# Patient Record
Sex: Male | Born: 1945
Health system: Southern US, Community
[De-identification: ages and names within clinical notes are randomized; demographics above are authoritative.]

## PROBLEM LIST (undated history)

## (undated) DIAGNOSIS — M7122 Synovial cyst of popliteal space [Baker], left knee: Secondary | ICD-10-CM

## (undated) DIAGNOSIS — H833X9 Noise effects on inner ear, unspecified ear: Secondary | ICD-10-CM

## (undated) DIAGNOSIS — Z77098 Contact with and (suspected) exposure to other hazardous, chiefly nonmedicinal, chemicals: Secondary | ICD-10-CM

## (undated) DIAGNOSIS — C801 Malignant (primary) neoplasm, unspecified: Secondary | ICD-10-CM

## (undated) DIAGNOSIS — A419 Sepsis, unspecified organism: Secondary | ICD-10-CM

## (undated) DIAGNOSIS — N200 Calculus of kidney: Secondary | ICD-10-CM

## (undated) HISTORY — DX: Calculus of kidney: N20.0

## (undated) HISTORY — DX: Malignant (primary) neoplasm, unspecified: C80.1

## (undated) HISTORY — DX: Sepsis, unspecified organism: A41.9

## (undated) HISTORY — DX: Noise effects on inner ear, unspecified ear: H83.3X9

## (undated) HISTORY — DX: Contact with and (suspected) exposure to other hazardous, chiefly nonmedicinal, chemicals: Z77.098

## (undated) HISTORY — DX: Synovial cyst of popliteal space (Baker), left knee: M71.22

---

## 1990-05-19 HISTORY — PX: EYE SURGERY: SHX253

## 1994-05-19 HISTORY — PX: APPENDECTOMY: SHX54

## 1994-05-19 HISTORY — PX: BACK SURGERY: SHX140

## 2007-06-16 ENCOUNTER — Ambulatory Visit: Payer: Self-pay | Admitting: Unknown Physician Specialty

## 2007-07-07 ENCOUNTER — Ambulatory Visit: Payer: Self-pay | Admitting: Unknown Physician Specialty

## 2007-07-12 ENCOUNTER — Ambulatory Visit: Payer: Self-pay | Admitting: Unknown Physician Specialty

## 2009-07-25 ENCOUNTER — Ambulatory Visit: Payer: Self-pay | Admitting: Ophthalmology

## 2011-09-19 ENCOUNTER — Emergency Department: Payer: Self-pay | Admitting: Emergency Medicine

## 2012-01-16 DIAGNOSIS — N200 Calculus of kidney: Secondary | ICD-10-CM | POA: Insufficient documentation

## 2012-01-16 DIAGNOSIS — G8929 Other chronic pain: Secondary | ICD-10-CM | POA: Insufficient documentation

## 2012-01-16 DIAGNOSIS — R2990 Unspecified symptoms and signs involving the nervous system: Secondary | ICD-10-CM | POA: Insufficient documentation

## 2012-01-16 DIAGNOSIS — M549 Dorsalgia, unspecified: Secondary | ICD-10-CM | POA: Insufficient documentation

## 2012-01-22 DIAGNOSIS — D649 Anemia, unspecified: Secondary | ICD-10-CM | POA: Insufficient documentation

## 2012-08-04 DIAGNOSIS — Z79891 Long term (current) use of opiate analgesic: Secondary | ICD-10-CM | POA: Insufficient documentation

## 2013-02-03 DIAGNOSIS — G4700 Insomnia, unspecified: Secondary | ICD-10-CM | POA: Insufficient documentation

## 2013-02-03 DIAGNOSIS — M25512 Pain in left shoulder: Secondary | ICD-10-CM | POA: Insufficient documentation

## 2015-05-05 DIAGNOSIS — Z6823 Body mass index (BMI) 23.0-23.9, adult: Secondary | ICD-10-CM | POA: Diagnosis not present

## 2015-05-05 DIAGNOSIS — R030 Elevated blood-pressure reading, without diagnosis of hypertension: Secondary | ICD-10-CM | POA: Diagnosis not present

## 2015-05-05 DIAGNOSIS — I8393 Asymptomatic varicose veins of bilateral lower extremities: Secondary | ICD-10-CM | POA: Diagnosis not present

## 2015-05-05 DIAGNOSIS — Z0000 Encounter for general adult medical examination without abnormal findings: Secondary | ICD-10-CM | POA: Diagnosis not present

## 2015-05-05 DIAGNOSIS — G622 Polyneuropathy due to other toxic agents: Secondary | ICD-10-CM | POA: Diagnosis not present

## 2015-09-14 DIAGNOSIS — W57XXXA Bitten or stung by nonvenomous insect and other nonvenomous arthropods, initial encounter: Secondary | ICD-10-CM | POA: Diagnosis not present

## 2015-09-14 DIAGNOSIS — S80862A Insect bite (nonvenomous), left lower leg, initial encounter: Secondary | ICD-10-CM | POA: Diagnosis not present

## 2016-06-13 ENCOUNTER — Telehealth: Payer: Self-pay | Admitting: Student

## 2016-06-13 NOTE — Telephone Encounter (Signed)
Pt called back.  He had a flu shot in Nov from the [place].  He isnt a patient here.

## 2016-08-22 LAB — BASIC METABOLIC PANEL
BUN: 9 mg/dL (ref 4–21)
CREATININE: 1.1 mg/dL (ref 0.6–1.3)
GLUCOSE: 75 mg/dL
POTASSIUM: 3.3 mmol/L — AB (ref 3.4–5.3)
Sodium: 138 mmol/L (ref 137–147)

## 2016-08-22 LAB — TSH: TSH: 1.72 u[IU]/mL (ref 0.41–5.90)

## 2016-08-22 LAB — HEMOGLOBIN A1C: HEMOGLOBIN A1C: 4.5

## 2016-08-22 LAB — CBC AND DIFFERENTIAL
HCT: 35 % — AB (ref 41–53)
HEMOGLOBIN: 13 g/dL — AB (ref 13.5–17.5)
Platelets: 182 10*3/uL (ref 150–399)
WBC: 5.4 10*3/mL

## 2016-08-22 LAB — HEPATIC FUNCTION PANEL
ALT: 21 U/L (ref 10–40)
AST: 15 U/L (ref 14–40)
Alkaline Phosphatase: 95 U/L (ref 25–125)
BILIRUBIN DIRECT: 0.2 mg/dL (ref 0.01–0.4)
BILIRUBIN, TOTAL: 0.7 mg/dL

## 2016-08-22 LAB — LIPID PANEL
Cholesterol: 155 mg/dL (ref 0–200)
HDL: 48 mg/dL (ref 35–70)
LDL Cholesterol: 75 mg/dL
Triglycerides: 160 mg/dL (ref 40–160)

## 2016-08-22 LAB — PSA: PSA: 0.921

## 2016-08-26 ENCOUNTER — Encounter: Payer: Self-pay | Admitting: Nurse Practitioner

## 2016-08-28 ENCOUNTER — Ambulatory Visit (INDEPENDENT_AMBULATORY_CARE_PROVIDER_SITE_OTHER): Payer: Medicare HMO | Admitting: Nurse Practitioner

## 2016-08-28 ENCOUNTER — Encounter: Payer: Self-pay | Admitting: Nurse Practitioner

## 2016-08-28 ENCOUNTER — Encounter: Payer: Self-pay | Admitting: *Deleted

## 2016-08-28 VITALS — BP 138/68 | HR 70 | Temp 98.7°F | Resp 16 | Ht 68.5 in | Wt 190.0 lb

## 2016-08-28 DIAGNOSIS — R0609 Other forms of dyspnea: Secondary | ICD-10-CM | POA: Diagnosis not present

## 2016-08-28 DIAGNOSIS — W57XXXD Bitten or stung by nonvenomous insect and other nonvenomous arthropods, subsequent encounter: Secondary | ICD-10-CM | POA: Diagnosis not present

## 2016-08-28 DIAGNOSIS — R0600 Dyspnea, unspecified: Secondary | ICD-10-CM

## 2016-08-28 DIAGNOSIS — Z7689 Persons encountering health services in other specified circumstances: Secondary | ICD-10-CM

## 2016-08-28 DIAGNOSIS — R011 Cardiac murmur, unspecified: Secondary | ICD-10-CM

## 2016-08-28 DIAGNOSIS — C6290 Malignant neoplasm of unspecified testis, unspecified whether descended or undescended: Secondary | ICD-10-CM | POA: Insufficient documentation

## 2016-08-28 NOTE — Patient Instructions (Addendum)
[facility], Thank you for coming in to clinic today.  1. Vaccine for pneumonia:  Find out which you have received.  You will need to get the other one.  2. Return for your medicare wellness at your next convenience.  3. For your murmur: we will get a stress test with echocardiogram.  This will be at [facility] in [place]. They will call you with an appointment.  Please schedule a follow-up appointment with Cassell Smiles, AGNP in 1 months for medicare wellness.  If you have any other questions or concerns, please feel free to call the clinic or send a message through [place]. You may also schedule an earlier appointment if necessary.  Cassell Smiles, DNP, AGNP-BC Adult Gerontology Nurse Practitioner [facility], [facility]    Tick Bite Information, Adult Ticks are insects that draw blood for food. Most ticks live in shrubs and grassy areas. They climb onto people and animals that brush against the leaves and grasses that they rest on. Then they bite, attaching themselves to the skin. Most ticks are harmless, but some ticks carry germs that can spread to a person through a bite and cause a disease. To reduce your risk of getting a disease from a tick bite, it is important to take steps to prevent tick bites. It is also important to check for ticks after being outdoors. If you find that a tick has attached to you, watch for symptoms of disease. How can I prevent tick bites? Take these steps to help prevent tick bites when you are outdoors in an area where ticks are found:  Use insect repellent that has DEET (20% or higher), picaridin, or IR3535 in it. Use it on:  Skin that is showing.  The top of your boots.  Your pant legs.  Your sleeve cuffs.  For repellent products that contain permethrin, follow product instructions. Use these products on:  Clothing.  Gear.  Boots.  Tents.  Wear protective clothing. Long sleeves and long pants offer the best protection from  ticks.  Wear light-colored clothing so you can see ticks more easily.  Tuck your pant legs into your socks.  If you go walking on a trail, stay in the middle of the trail so your skin, hair, and clothing do not touch the bushes.  Avoid walking through areas with long grass.  Check for ticks on your clothing, hair, and skin often while you are outside, and check again before you go inside. Make sure to check the places that ticks attach themselves most often. These places include the scalp, neck, armpits, waist, groin, and joint areas. Ticks that carry a disease called Lyme disease have to be attached to the skin for 24-48 hours. Checking for ticks every day will lessen your risk of this and other diseases.  When you come indoors, wash your clothes and take a shower or a bath right away. Dry your clothes in a dryer on high heat for at least 60 minutes. This will kill any ticks in your clothes. What is the proper way to remove a tick? If you find a tick on your body, remove it as soon as possible. Removing a tick sooner rather than later can prevent germs from passing from the tick to your body. To remove a tick that is crawling on your skin but has not bitten:  Go outdoors and brush the tick off.  Remove the tick with tape or a lint roller. To remove a tick that is attached to  your skin:  Wash your hands.  If you have latex gloves, put them on.  Use tweezers, curved forceps, or a tick-removal tool to gently grasp the tick as close to your skin and the tick's head as possible.  Gently pull with steady, upward pressure until the tick lets go. When removing the tick:  Take care to keep the tick's head attached to its body.  Do not twist or jerk the tick. This can make the tick's head or mouth break off.  Do not squeeze or crush the tick's body. This could force disease-carrying fluids from the tick into your body. Do not try to remove a tick with heat, alcohol, petroleum jelly, or  fingernail polish. Using these methods can cause the tick to salivate and regurgitate into your bloodstream, increasing your risk of getting a disease. What should I do after removing a tick?  Clean the bite area with soap and water, rubbing alcohol, or an iodine scrub.  If an antiseptic cream or ointment is available, apply a small amount to the bite site.  Wash and disinfect any instruments that you used to remove the tick. How should I dispose of a tick? To dispose of a live tick, use one of these methods:  Place it in rubbing alcohol.  Place it in a sealed bag or container.  Wrap it tightly in tape.  Flush it down the toilet. Contact a health care provider if:  You have symptoms of a disease after a tick bite. Symptoms of a tick-borne disease can occur from moments after the tick bites to up to 30 days after a tick is removed. Symptoms include:  Muscle, joint, or bone pain.  Difficulty walking or moving your legs.  Numbness in the legs.  Paralysis.  Red rash around the tick bite area that is shaped like a target or a "bull's-eye."  Redness and swelling in the area of the tick bite.  Fever.  Repeated vomiting.  Diarrhea.  Weight loss.  Tender, swollen lymph glands.  Shortness of breath.  Cough.  Pain in the abdomen.  Headache.  Abnormal tiredness.  A change in your level of consciousness.  Confusion. Get help right away if:  You are not able to remove a tick.  A part of a tick breaks off and gets stuck in your skin.  Your symptoms get worse. Summary  Ticks may carry germs that can spread to a person through a bite and cause disease.  Wear protective clothing and use insect repellent to prevent tick bites. Follow product instructions.  If you find a tick on your body, remove it as soon as possible. If the tick is attached, do not try to remove with heat, alcohol, petroleum jelly, or fingernail polish.  Remove the attached tick using tweezers,  curved forceps, or a tick-removal tool. Gently pull with steady, upward pressure until the tick lets go. Do not twist or jerk the tick. Do not squeeze or crush the tick's body.  If you have symptoms after being bitten by a tick, contact a health care provider. This information is not intended to replace advice given to you by your health care provider. Make sure you discuss any questions you have with your health care provider. Document Released: 05/02/2000 Document Revised: 02/15/2016 Document Reviewed: 02/15/2016 Elsevier Interactive Patient Education  2017 [employer].

## 2016-08-28 NOTE — Progress Notes (Signed)
Subjective:    Patient ID: Jackson [name], male    DOB: Jan 17, 1946, 71 y.o.   MRN: [id number]  Jackson [name] is a 71 y.o. male presenting on 08/28/2016 for Establish Care   HPI   Jackson [name] is a [place] Vet with agent orange exposure (but no DM). He also has chronic hearing loss from exposure to helicopters, machine guns, and hum vee engines.  He does have lower leg pain and arthropathies secondary to agent orange.  Recently he has had significant knee pain and received a L knee injection recently at [place] in [place].  His PCP is at [facility], and he will continue to see them for care.  He desires to establish care with [place] close to his home for medicare wellness visits and acute needs that may arise.  He has his most recent PCP and orthopedic visit notes from the [place] with him today.  Hypertension BP 128/64 at best.  Usually 150/64 or higher w/ stress. He notes BP lability with unpredictable pattern of hypotension/hypertension when taking BP meds.  He states several have been tried in the past.  He requests his [place] PCP continue to manage this.  Frequent Tick Bites He removed 5 ticks from his body last week.  On April 6th, he was given 200 mg doxycycline tablet take one tab once for prophylaxis.  He states he had rocky mountain spotted fever twice in recent years.  He exhibits no active symptoms and has had no tick bites since his doxycycline dose.  He does have two dogs and states they are regularly treated for ticks.  He also denies being in the woods recently.     Health maintenance: Colonoscopy 2 years ago - "clean" Tdap and flu shot at same time in 2017 Pneumonia vaccine - in nov or dec 2017 PSA check performed - 08/22/2016: 0.921 Hep C screening: it's possible he did this on the [place] bus last year Physical activity: 2-3 miles walking per day weather permitting   Murmur Patient has not had a murmur in past.  He also has never had an echocardiogram or stress test.  He endorses  dyspnea with minimal exertion that resolves with rest.  He still walks regularly, but doesn't have the same endurance he used to.  Past Medical History:  Diagnosis Date  . Agent orange exposure   . Baker's cyst of knee, left   . Cancer ([place])    testicular  . Hearing loss d/t noise    Past Surgical History:  Procedure Laterality Date  . APPENDECTOMY  1996  . BACK SURGERY  1996   Fusion L4-L5; Bulging disks L2-L4. L5-S1  . EYE SURGERY Bilateral 1992   Radical Kernotomy   Social History   Social History  . Marital status: Divorced    Spouse name: N/A  . Number of children: N/A  . Years of education: N/A   Occupational History  . Not on file.   Social History Main Topics  . Smoking status: Never Smoker  . Smokeless tobacco: Never Used  . Alcohol use No     Comment: former  . Drug use: No  . Sexual activity: Not Currently   Other Topics Concern  . Not on file   Social History Narrative  . No narrative on file   Family History  Problem Relation Age of Onset  . Cerebral aneurysm Mother   . Heart attack Father   . Breast cancer Sister   . Breast cancer  Daughter    Current Outpatient Prescriptions on File Prior to Visit  Medication Sig  . Ascorbic Acid (VITAMIN C) 1000 MG tablet Take 1,000 mg by mouth daily.  Marland Kitchen B-Complex-C CAPS Take 1 capsule by mouth daily.  . cholecalciferol (VITAMIN D) 1000 units tablet Take 1,000 Units by mouth daily.  . cyanocobalamin 500 MCG tablet Take 500 mcg by mouth daily.  . Magnesium 500 MG CAPS Take 500 mg by mouth daily.  . Omega-3 Fatty Acids (FISH OIL) 1000 MG CAPS Take 1,000 mg by mouth daily.  . Potassium Aminobenzoate 500 MG CAPS Take 500 mg by mouth daily.   No current facility-administered medications on file prior to visit.     Review of Systems  Constitutional: Negative.   Eyes: Negative.   Respiratory: Positive for shortness of breath.        On exertion with minimal effort  Gastrointestinal: Negative.   Endocrine:  Negative.   Genitourinary: Negative.   Musculoskeletal: Positive for arthralgias and back pain.  Skin: Negative.   Allergic/Immunologic: Negative.   Neurological: Negative.   Hematological: Negative.   Psychiatric/Behavioral: Positive for sleep disturbance.       Difficulty waking and can't resume sleep.  Sometimes he has flashbacks/PTSD symptoms   Per HPI unless specifically indicated above  Personal review of last PCP physical and Orthpedic visits.  PCP Dr. Hortencia Conradi states patient is okay to proceed with laparoscopic surgery for baker's cysts and torn meniscus of left knee.  Lab results entered into quick abstraction.    Decision to obtain records from patient from [place] for colonoscopy and vaccinations (tetanus, flu, pneumonia).     Objective:    BP (!) 157/64   Pulse 70   Temp 98.7 F (37.1 C) (Oral)   Resp 16   Ht 5' 8.5" (1.74 m)   Wt 190 lb (86.2 kg)   BMI 28.47 kg/m   Recheck: 138/68  Wt Readings from Last 3 Encounters:  08/28/16 190 lb (86.2 kg)    Physical Exam  Constitutional: He is oriented to person, place, and time. He appears well-developed and well-nourished. No distress.  HENT:  Head: Normocephalic and atraumatic.  Neck: Normal range of motion. Neck supple. No JVD present. No tracheal deviation present. No thyromegaly present.  Cardiovascular: Normal rate, regular rhythm, S1 normal, S2 normal and intact distal pulses.   Murmur heard.  Systolic murmur is present with a grade of 2/6  Pulmonary/Chest: Effort normal and breath sounds normal.  Abdominal: Soft. Bowel sounds are normal. He exhibits no distension.    Musculoskeletal:       Left knee: He exhibits decreased range of motion. He exhibits no swelling. No tenderness found.       Legs: Lymphadenopathy:    He has no cervical adenopathy.  Neurological: He is alert and oriented to person, place, and time.  Skin: Skin is warm, dry and intact.  No rashes noted  Psychiatric: He has a normal mood and  affect. His behavior is normal. Judgment and thought content normal.  Vitals reviewed.      Assessment & Plan:   Problem List Items Addressed This Visit    None    Visit Diagnoses    Establishing care with new doctor, encounter for    -  Primary Stable medical conditions with appropriate followup [facility]  Plan: 1. Schedule follow up for medicare wellness visit. 2. Obtain records from [place] for colonoscopy, tdap, flu, pna vaccines  Frequent tick  bites Concerned for how frequently patient is getting exposure to ticks with bites.  Plan: 1. Educated patient on proper clothing when outdoors, daily tick checks, and continuing to treat dogs for ticks to limit possible exposures. 2. Reviewed rashes of Lyme disease and rock mountain spotted fever.    Murmur    Dyspnea on exertion     New diagnoses. Dyspnea possibly associated symptom of heart murmur.  Plan: 1. Obtain echocardiogram stress test. Evaluate murmur to establish baseline.  No significant impact on daily life from dyspnea. 2. Patient states he is not going to have knee surgery until seen by a different orthopedic surgeon.  Last one is moving away before surgery date could be scheduled. 3. Follow-up as needed or if results from stress test require follow-up.   Relevant Orders   ECHOCARDIOGRAM STRESS TEST      Meds ordered this encounter  Medications  . aspirin 81 MG chewable tablet    Sig: Chew 81 mg by mouth daily.  . cholestyramine (QUESTRAN) 4 GM/DOSE powder    Sig: Take 4 g by mouth 2 (two) times daily with a meal.  . fluocinonide cream (LIDEX) 0.05 %    Sig: Apply 1 application topically 2 (two) times daily.  Marland Kitchen ibuprofen (ADVIL,MOTRIN) 800 MG tablet    Sig: Take 800 mg by mouth 2 (two) times daily as needed (with food).  Marland Kitchen lisinopril (PRINIVIL,ZESTRIL) 5 MG tablet    Sig: Take 5 mg by mouth daily.      Follow up plan: Return in about 1 month (around 09/27/2016) for medicare  wellness.    Cassell Smiles, DNP, AGPCNP-BC Adult Gerontology Primary Care Nurse Practitioner [facility] 08/30/2016, 1:13 PM

## 2016-08-30 ENCOUNTER — Encounter: Payer: Self-pay | Admitting: Nurse Practitioner

## 2016-09-01 NOTE — Progress Notes (Signed)
I have reviewed this encounter including the documentation in this note and/or discussed this patient with the provider, Cassell Smiles, AGPCNP-BC. I am certifying that I agree with the content of this note as supervising physician.  Nobie Putnam, [facility] 09/01/2016, 12:05 PM

## 2016-09-30 ENCOUNTER — Telehealth: Payer: Self-pay | Admitting: Nurse Practitioner

## 2016-09-30 NOTE — Telephone Encounter (Signed)
Called pt to schedule Annual Wellness Visit with [facility] [facility] for 5/22:  - knb

## 2016-10-01 ENCOUNTER — Telehealth: Payer: Self-pay | Admitting: Nurse Practitioner

## 2016-10-01 NOTE — Telephone Encounter (Signed)
S/w pt and reviewed stress echo instructions, location, date and time. Reviewed pt medications.  He reports he does not take lisinopril anymore as he only took this when BP was elevated because of a pain issue. Pt has no questions at this time.

## 2016-10-02 ENCOUNTER — Ambulatory Visit (INDEPENDENT_AMBULATORY_CARE_PROVIDER_SITE_OTHER): Payer: Medicare HMO

## 2016-10-02 DIAGNOSIS — R011 Cardiac murmur, unspecified: Secondary | ICD-10-CM

## 2016-10-02 DIAGNOSIS — R0609 Other forms of dyspnea: Secondary | ICD-10-CM

## 2016-10-03 LAB — ECHOCARDIOGRAM STRESS TEST
Estimated workload: 4.7 METS
Exercise duration (min): 3 min
Exercise duration (sec): 6 s
MPHR: 149 {beats}/min
Peak HR: 123 {beats}/min
Percent HR: 82 %
Rest HR: 75 {beats}/min

## 2016-10-06 ENCOUNTER — Telehealth: Payer: Self-pay | Admitting: Nurse Practitioner

## 2016-10-06 ENCOUNTER — Other Ambulatory Visit: Payer: Self-pay

## 2016-10-06 DIAGNOSIS — R0609 Other forms of dyspnea: Secondary | ICD-10-CM

## 2016-10-06 DIAGNOSIS — R0602 Shortness of breath: Secondary | ICD-10-CM

## 2016-10-06 DIAGNOSIS — R0600 Dyspnea, unspecified: Secondary | ICD-10-CM

## 2016-10-06 NOTE — Telephone Encounter (Signed)
Pt probably is having worsening of his lung function.  I would proceed with pulmonary function testing with referral to pulmonology.

## 2016-10-06 NOTE — Telephone Encounter (Signed)
-----   Message from Wilson Singer, [place] sent at 10/06/2016 11:50 AM EDT ----- Pt notified of his stress test results. He said he is still experiencing the SOB that is worse with exertion. Please advise

## 2016-10-06 NOTE — Telephone Encounter (Signed)
The pt was notified of the Pulmonology referral.

## 2016-10-09 ENCOUNTER — Ambulatory Visit (INDEPENDENT_AMBULATORY_CARE_PROVIDER_SITE_OTHER): Payer: Medicare HMO | Admitting: Internal Medicine

## 2016-10-09 ENCOUNTER — Ambulatory Visit
Admission: RE | Admit: 2016-10-09 | Discharge: 2016-10-09 | Disposition: A | Discharge status: Home / Self Care | Payer: Medicare HMO | Source: Ambulatory Visit | Attending: Internal Medicine | Admitting: Internal Medicine

## 2016-10-09 ENCOUNTER — Encounter: Payer: Self-pay | Admitting: Internal Medicine

## 2016-10-09 VITALS — BP 158/80 | HR 71 | Resp 16 | Ht 68.5 in | Wt 189.0 lb

## 2016-10-09 DIAGNOSIS — R0600 Dyspnea, unspecified: Secondary | ICD-10-CM

## 2016-10-09 DIAGNOSIS — R0602 Shortness of breath: Secondary | ICD-10-CM | POA: Diagnosis not present

## 2016-10-09 NOTE — Patient Instructions (Signed)
--  Will check CXR, Lung function test.  --Encouraged to try to increase  activity level.  --Will give sample of Anoro inhaler to use, if notice that breathing is better, then call for a prescription.

## 2016-10-09 NOTE — Progress Notes (Signed)
[facility] Consultation      Assessment and Plan:  Dyspnea.  --Uncertain etiology.  --Will check CXR, Lung function test.  --Encouraged to try to increase his activity level.  --Will give sample of Anoro, patient is to call for prescription if he notices a difference. -If all testing is negative, I suspect that the patient likely lost a lot of strength over his course of doxycycline during which she was very active.    Date: 10/09/2016  MRN# [id number] Jackson [name] 1946/02/28   Jackson [name] is a 71 y.o. old male seen in consultation for chief complaint of:    Chief Complaint  Patient presents with  . Advice Only    Referred by Cassell Smiles NP for sob with exhertion    HPI:  He is seen at the [facility], but he recently started with a new physician outside of the [place]. It was noted that he had a heart murmur and it was noted that he has been having dyspnea.  He had RMSF about a year ago, and had noted since that time his breathing has been getting worse since that time.  He has has not smoked in the past 50 years. He has 4 dogs none are in bedroom, and he has had them for more than 10 years.  He has never been tested for allergies several years ago at the [facility]. He does not recall being told that he had any significant allergies. He had this due to his [place] service and possible agent orange exposure.   He notes that his breathing gets worse with 50 lb bag of dog food for the past year. He used to carry the dog food bag farther before. He also notes that when he did the stress test last week he was not able to go very far.  He denies coughing, hemoptysis. He has never been diagnosed with asthma or other respiratory issues in the past. The dyspnea is really when he is carrying things and doing strenuous work.   He notes that when he got RMSF, he took doxy for about 3 months, he had no energy at that time, did little work at that time and he did not go out in the  sun due to rash.   Baseline oxygen sat on RA at rest was 97% and HR 70. After 360 feet sat was 98% and HR 87; minimal dyspnea, walked briskly.   **Pneumonia vaccine - in nov or dec 2017 **sat was 98% and HR 87  **Echo 10/02/16;  - The estimated LV ejection fraction was 60-65%. - Normal wall motion; no LV regional wall motion abnormalities. Left ventricular ejection fraction was normal at rest and with stress. Normal echo stress though suboptimal as target heart rate not achieved.   PMHX:   Past Medical History:  Diagnosis Date  . Agent orange exposure   . Baker's cyst of knee, left   . Cancer ([place])    testicular  . Hearing loss d/t noise    Surgical Hx:  Past Surgical History:  Procedure Laterality Date  . APPENDECTOMY  1996  . BACK SURGERY  1996   Fusion L4-L5; Bulging disks L2-L4. L5-S1  . EYE SURGERY Bilateral 1992   Radical Kernotomy   Family Hx:  Family History  Problem Relation Age of Onset  . Cerebral aneurysm Mother   . Heart attack Father   . Breast cancer Sister   . Breast cancer Daughter    Social Hx:  Social History  Substance Use Topics  . Smoking status: Never Smoker  . Smokeless tobacco: Never Used  . Alcohol use No     Comment: former   Medication:    Current Outpatient Prescriptions:  .  Ascorbic Acid (VITAMIN C) 1000 MG tablet, Take 1,000 mg by mouth daily., Disp: , Rfl:  .  aspirin 81 MG chewable tablet, Chew 81 mg by mouth daily., Disp: , Rfl:  .  B-Complex-C CAPS, Take 1 capsule by mouth daily., Disp: , Rfl:  .  cholecalciferol (VITAMIN D) 1000 units tablet, Take 1,000 Units by mouth daily., Disp: , Rfl:  .  cholestyramine (QUESTRAN) 4 GM/DOSE powder, Take 4 g by mouth 2 (two) times daily with a meal., Disp: , Rfl:  .  cyanocobalamin 500 MCG tablet, Take 500 mcg by mouth daily., Disp: , Rfl:  .  fluocinonide cream (LIDEX) 6.30 %, Apply 1 application topically 2 (two) times daily., Disp: , Rfl:  .  ibuprofen (ADVIL,MOTRIN) 800 MG tablet,  Take 800 mg by mouth 2 (two) times daily as needed (with food)., Disp: , Rfl:  .  lisinopril (PRINIVIL,ZESTRIL) 5 MG tablet, Take 5 mg by mouth daily., Disp: , Rfl:  .  Magnesium 500 MG CAPS, Take 500 mg by mouth daily., Disp: , Rfl:  .  Omega-3 Fatty Acids (FISH OIL) 1000 MG CAPS, Take 1,000 mg by mouth daily., Disp: , Rfl:  .  Potassium Aminobenzoate 500 MG CAPS, Take 500 mg by mouth daily., Disp: , Rfl:    Allergies:  Penicillins  Review of Systems: Gen:  Denies  fever, sweats, chills HEENT: Denies blurred vision, double vision. bleeds, sore throat Cvc:  No dizziness, chest pain. Resp:   Denies cough or sputum production, shortness of breath Gi: Denies swallowing difficulty, stomach pain. Gu:  Denies bladder incontinence, burning urine Ext:   No Joint pain, stiffness. Skin: No skin rash,  hives  Endoc:  No polyuria, polydipsia. Psych: No depression, insomnia. Other:  All other systems were reviewed with the patient and were negative other that what is mentioned in the HPI.   Physical Examination:   VS: Resp 16   Ht 5' 8.5" (1.74 m)   Wt 189 lb (85.7 kg)   BMI 28.32 kg/m   General Appearance: No distress  Neuro:without focal findings,  speech normal,  HEENT: PERRLA, EOM intact.   Pulmonary: normal breath sounds, No wheezing.  CardiovascularNormal S1,S2.  No m/r/g.   Abdomen: Benign, Soft, non-tender. Renal:  No costovertebral tenderness  GU:  No performed at this time. Endoc: No evident thyromegaly, no signs of acromegaly. Skin:   warm, no rashes, no ecchymosis  Extremities: normal, no cyanosis, clubbing.  Other findings:    LABORATORY PANEL:   CBC No results for input(s): WBC, HGB, HCT, PLT in the last 168 hours. ------------------------------------------------------------------------------------------------------------------  Chemistries  No results for input(s): NA, K, CL, CO2, GLUCOSE, BUN, CREATININE, CALCIUM, MG, AST, ALT, ALKPHOS, BILITOT in the last 168  hours.  Invalid input(s): GFRCGP ------------------------------------------------------------------------------------------------------------------  Cardiac Enzymes No results for input(s): TROPONINI in the last 168 hours. ------------------------------------------------------------  RADIOLOGY:  No results found.     Thank  you for the consultation and for allowing  Pulmonary, Critical Care to assist in the care of your patient. Our recommendations are noted above.  Please contact us if we can be of further service.   Marda Stalker, MD.  Board Certified in Internal Medicine, Pulmonary Medicine, [place], and Sleep Medicine.  [facility] Office Number: [phone]  Patricia Pesa, M.D.  Merton Border, M.D  10/09/2016

## 2016-10-14 ENCOUNTER — Ambulatory Visit (INDEPENDENT_AMBULATORY_CARE_PROVIDER_SITE_OTHER): Payer: Medicare HMO

## 2016-10-14 VITALS — BP 148/68 | HR 62 | Temp 98.1°F | Resp 16 | Ht 69.0 in | Wt 187.4 lb

## 2016-10-14 DIAGNOSIS — Z0000 Encounter for general adult medical examination without abnormal findings: Secondary | ICD-10-CM

## 2016-10-14 NOTE — Patient Instructions (Addendum)
Jackson [name] , Thank you for taking time to come for your Medicare Wellness Visit. I appreciate your ongoing commitment to your health goals. Please review the following plan we discussed and let me know if I can assist you in the future.   Screening recommendations/referrals: Colonoscopy: due now Recommended yearly ophthalmology/optometry visit for glaucoma screening and checkup Recommended yearly dental visit for hygiene and checkup  Vaccinations: Influenza vaccine: up to date, due 04/2017 Pneumococcal vaccine: completed series Tdap vaccine: up to date Shingles vaccine: due now, check with your insurance company for coverage   Advanced directives: Advance directive discussed with you today. Please bring a copy of your living will at your convenience.   Conditions/risks identified: Fall prevention discussed   Next appointment: Follow up in one year for your annual wellness exam and physical.   Preventive Care 65 Years and Older, Male Preventive care refers to lifestyle choices and visits with your health care provider that can promote health and wellness. What does preventive care include?  A yearly physical exam. This is also called an annual well check.  Dental exams once or twice a year.  Routine eye exams. Ask your health care provider how often you should have your eyes checked.  Personal lifestyle choices, including:  Daily care of your teeth and gums.  Regular physical activity.  Eating a healthy diet.  Avoiding tobacco and drug use.  Limiting alcohol use.  Practicing safe sex.  Taking low doses of aspirin every day.  Taking vitamin and mineral supplements as recommended by your health care provider. What happens during an annual well check? The services and screenings done by your health care provider during your annual well check will depend on your age, overall health, lifestyle risk factors, and family history of disease. Counseling  Your health care  provider may ask you questions about your:  Alcohol use.  Tobacco use.  Drug use.  Emotional well-being.  Home and relationship well-being.  Sexual activity.  Eating habits.  History of falls.  Memory and ability to understand (cognition).  Work and work Statistician. Screening  You may have the following tests or measurements:  Height, weight, and BMI.  Blood pressure.  Lipid and cholesterol levels. These may be checked every 5 years, or more frequently if you are over 19 years old.  Skin check.  Lung cancer screening. You may have this screening every year starting at age 67 if you have a 30-pack-year history of smoking and currently smoke or have quit within the past 15 years.  Fecal occult blood test (FOBT) of the stool. You may have this test every year starting at age 40.  Flexible sigmoidoscopy or colonoscopy. You may have a sigmoidoscopy every 5 years or a colonoscopy every 10 years starting at age 37.  Prostate cancer screening. Recommendations will vary depending on your family history and other risks.  Hepatitis C blood test.  Hepatitis B blood test.  Sexually transmitted disease (STD) testing.  Diabetes screening. This is done by checking your blood sugar (glucose) after you have not eaten for a while (fasting). You may have this done every 1-3 years.  Abdominal aortic aneurysm (AAA) screening. You may need this if you are a current or former smoker.  Osteoporosis. You may be screened starting at age 66 if you are at high risk. Talk with your health care provider about your test results, treatment options, and if necessary, the need for more tests. Vaccines  Your health care provider may recommend certain  vaccines, such as:  Influenza vaccine. This is recommended every year.  Tetanus, diphtheria, and acellular pertussis (Tdap, Td) vaccine. You may need a Td booster every 10 years.  Zoster vaccine. You may need this after age 30.  Pneumococcal  13-valent conjugate (PCV13) vaccine. One dose is recommended after age 101.  Pneumococcal polysaccharide (PPSV23) vaccine. One dose is recommended after age 16. Talk to your health care provider about which screenings and vaccines you need and how often you need them. This information is not intended to replace advice given to you by your health care provider. Make sure you discuss any questions you have with your health care provider. Document Released: 06/01/2015 Document Revised: 01/23/2016 Document Reviewed: 03/06/2015 Elsevier Interactive Patient Education  2017 [place] Prevention in the Home Falls can cause injuries. They can happen to people of all ages. There are many things you can do to make your home safe and to help prevent falls. What can I do on the outside of my home?  Regularly fix the edges of walkways and driveways and fix any cracks.  Remove anything that might make you trip as you walk through a door, such as a raised step or threshold.  Trim any bushes or trees on the path to your home.  Use bright outdoor lighting.  Clear any walking paths of anything that might make someone trip, such as rocks or tools.  Regularly check to see if handrails are loose or broken. Make sure that both sides of any steps have handrails.  Any raised decks and porches should have guardrails on the edges.  Have any leaves, snow, or ice cleared regularly.  Use sand or salt on walking paths during winter.  Clean up any spills in your garage right away. This includes oil or grease spills. What can I do in the bathroom?  Use night lights.  Install grab bars by the toilet and in the tub and shower. Do not use towel bars as grab bars.  Use non-skid mats or decals in the tub or shower.  If you need to sit down in the shower, use a plastic, non-slip stool.  Keep the floor dry. Clean up any water that spills on the floor as soon as it happens.  Remove soap buildup in the  tub or shower regularly.  Attach bath mats securely with double-sided non-slip rug tape.  Do not have throw rugs and other things on the floor that can make you trip. What can I do in the bedroom?  Use night lights.  Make sure that you have a light by your bed that is easy to reach.  Do not use any sheets or blankets that are too big for your bed. They should not hang down onto the floor.  Have a firm chair that has side arms. You can use this for support while you get dressed.  Do not have throw rugs and other things on the floor that can make you trip. What can I do in the kitchen?  Clean up any spills right away.  Avoid walking on wet floors.  Keep items that you use a lot in easy-to-reach places.  If you need to reach something above you, use a strong step stool that has a grab bar.  Keep electrical cords out of the way.  Do not use floor polish or wax that makes floors slippery. If you must use wax, use non-skid floor wax.  Do not have throw rugs and other things  on the floor that can make you trip. What can I do with my stairs?  Do not leave any items on the stairs.  Make sure that there are handrails on both sides of the stairs and use them. Fix handrails that are broken or loose. Make sure that handrails are as long as the stairways.  Check any carpeting to make sure that it is firmly attached to the stairs. Fix any carpet that is loose or worn.  Avoid having throw rugs at the top or bottom of the stairs. If you do have throw rugs, attach them to the floor with carpet tape.  Make sure that you have a light switch at the top of the stairs and the bottom of the stairs. If you do not have them, ask someone to add them for you. What else can I do to help prevent falls?  Wear shoes that:  Do not have high heels.  Have rubber bottoms.  Are comfortable and fit you well.  Are closed at the toe. Do not wear sandals.  If you use a stepladder:  Make sure that it  is fully opened. Do not climb a closed stepladder.  Make sure that both sides of the stepladder are locked into place.  Ask someone to hold it for you, if possible.  Clearly mark and make sure that you can see:  Any grab bars or handrails.  First and last steps.  Where the edge of each step is.  Use tools that help you move around (mobility aids) if they are needed. These include:  Canes.  Walkers.  Scooters.  Crutches.  Turn on the lights when you go into a dark area. Replace any light bulbs as soon as they burn out.  Set up your furniture so you have a clear path. Avoid moving your furniture around.  If any of your floors are uneven, fix them.  If there are any pets around you, be aware of where they are.  Review your medicines with your doctor. Some medicines can make you feel dizzy. This can increase your chance of falling. Ask your doctor what other things that you can do to help prevent falls. This information is not intended to replace advice given to you by your health care provider. Make sure you discuss any questions you have with your health care provider. Document Released: 03/01/2009 Document Revised: 10/11/2015 Document Reviewed: 06/09/2014 Elsevier Interactive Patient Education  2017 [employer].

## 2016-10-14 NOTE — Progress Notes (Signed)
Subjective:   Jackson [name] is a 71 y.o. male who presents for an Initial Medicare Annual Wellness Visit.   Review of Systems   Cardiac Risk Factors include: advanced age (>26men, >48 women);male gender    Objective:    Today's Vitals   10/14/16 0827  BP: (!) 148/68  Pulse: 62  Resp: 16  Temp: 98.1 F (36.7 C)  Weight: 187 lb 6.4 oz (85 kg)  Height: 5\' 9"  (1.753 m)   Body mass index is 27.67 kg/m.  Current Medications (verified) Outpatient Encounter Prescriptions as of 10/14/2016  Medication Sig  . Ascorbic Acid (VITAMIN C) 1000 MG tablet Take 1,000 mg by mouth daily.  Marland Kitchen aspirin 81 MG chewable tablet Chew 81 mg by mouth daily.  Marland Kitchen B-Complex-C CAPS Take 1 capsule by mouth daily.  . cholecalciferol (VITAMIN D) 1000 units tablet Take 1,000 Units by mouth daily.  . cyanocobalamin 500 MCG tablet Take 500 mcg by mouth daily.  . fluocinonide cream (LIDEX) 5.32 % Apply 1 application topically 2 (two) times daily.  Marland Kitchen ibuprofen (ADVIL,MOTRIN) 800 MG tablet Take 800 mg by mouth 2 (two) times daily as needed (with food).  . Magnesium 500 MG CAPS Take 500 mg by mouth daily.  . Omega-3 Fatty Acids (FISH OIL) 1000 MG CAPS Take 1,000 mg by mouth daily.  . Potassium Aminobenzoate 500 MG CAPS Take 500 mg by mouth daily. Taking 1000mg  currently  . cholestyramine (QUESTRAN) 4 GM/DOSE powder Take 4 g by mouth 2 (two) times daily with a meal.   No facility-administered encounter medications on file as of 10/14/2016.     Allergies (verified) Penicillins   History: Past Medical History:  Diagnosis Date  . Agent orange exposure   . Baker's cyst of knee, left   . Cancer ([place])    testicular  . Hearing loss d/t noise    Past Surgical History:  Procedure Laterality Date  . APPENDECTOMY  1996  . BACK SURGERY  1996   Fusion L4-L5; Bulging disks L2-L4. L5-S1  . EYE SURGERY Bilateral 1992   Radical Kernotomy   Family History  Problem Relation Age of Onset  . Cerebral aneurysm Mother     . Heart attack Father   . Breast cancer Sister   . Breast cancer Daughter    Social History   Occupational History  . Not on file.   Social History Main Topics  . Smoking status: Never Smoker  . Smokeless tobacco: Never Used  . Alcohol use No     Comment: former  . Drug use: No  . Sexual activity: Not Currently   Tobacco Counseling Counseling given: Not Answered   Activities of Daily Living In your present state of health, do you have any difficulty performing the following activities: 10/14/2016 08/28/2016  Hearing? N N  Vision? N N  Difficulty concentrating or making decisions? N N  Walking or climbing stairs? Y (No Data)  Dressing or bathing? N N  Doing errands, shopping? N N  Preparing Food and eating ? N -  Using the Toilet? N -  In the past six months, have you accidently leaked urine? N -  Do you have problems with loss of bowel control? N -  Managing your Medications? N -  Managing your Finances? N -  Housekeeping or managing your Housekeeping? N -  Some recent data might be hidden    Immunizations and Health Maintenance Immunization History  Administered Date(s) Administered  . Influenza-Unspecified 03/02/2015, 03/19/2016, 04/22/2016  .  Pneumococcal Conjugate-13 01/31/2015  . Pneumococcal Polysaccharide-23 06/25/2011  . Td 01/31/2015  . Tdap 01/31/2015   There are no preventive care reminders to display for this patient.  Patient Care Team: Mikey College, NP as PCP - General (Nurse Practitioner)  Indicate any recent Medical Services you may have received from other than [facility] providers in the past year (date may be approximate).    Assessment:   This is a routine wellness examination for [facility].   Hearing/Vision screen Vision Screening Comments: Had eye surgery in 1993.  Dietary issues and exercise activities discussed: Current Exercise Habits: Home exercise routine, Type of exercise: walking, Time (Minutes): 30, Frequency (Times/Week): 4,  Weekly Exercise (Minutes/Week): 120, Intensity: Mild, Exercise limited by: orthopedic condition(s) (knee)  Goals    . Prevent Falls          Fall prevention discussed      Depression Screen PHQ 2/9 Scores 10/14/2016 08/28/2016  PHQ - 2 Score 0 0  PHQ- 9 Score 0 -    Fall Risk Fall Risk  10/14/2016 08/28/2016  Falls in the past year? Yes No  Number falls in past yr: 2 or more -  Risk Factor Category  High Fall Risk -  Follow up Falls prevention discussed -    Cognitive Function:     6CIT Screen 10/14/2016  What Year? 0 points  What month? 0 points  What time? 0 points  Count back from 20 0 points  Months in reverse 2 points  Repeat phrase 0 points  Total Score 2    Screening Tests Health Maintenance  Topic Date Due  . INFLUENZA VACCINE  12/17/2016  . COLONOSCOPY  05/19/2021  . TETANUS/TDAP  01/30/2025  . Hepatitis C Screening  Completed  . PNA vac Low Risk Adult  Completed        Plan:  I have personally reviewed and addressed the Medicare Annual Wellness questionnaire and have noted the following in the patient's chart:  A. Medical and social history B. Use of alcohol, tobacco or illicit drugs  C. Current medications and supplements D. Functional ability and status E.  Nutritional status F.  Physical activity G. Advance directives H. List of other physicians I.  Hospitalizations, surgeries, and ER visits in previous 12 months [place] such as hearing and vision if needed, cognitive and depression L. Referrals and appointments - none  In addition, I have reviewed and discussed with patient certain preventive protocols, quality metrics, and best practice recommendations. A written personalized care plan for preventive services as well as general preventive health recommendations were provided to patient.   Signed,  Tyler Aas, LPN Nurse Health Advisor   MD Recommendations: Patient requesting X-Ray results from 5/24/208.

## 2017-01-15 ENCOUNTER — Telehealth: Payer: Self-pay | Admitting: Nurse Practitioner

## 2017-01-15 NOTE — Telephone Encounter (Signed)
Called pt to schedule for Annual Wellness Visit with Nurse Health Advisor, Tiffany Hill, my c/b # is [phone]  Jackson [name] ° °

## 2017-01-20 ENCOUNTER — Ambulatory Visit: Payer: Medicare HMO

## 2017-05-28 ENCOUNTER — Other Ambulatory Visit: Payer: Self-pay

## 2017-05-28 ENCOUNTER — Encounter: Payer: Self-pay | Admitting: Nurse Practitioner

## 2017-05-28 ENCOUNTER — Ambulatory Visit (INDEPENDENT_AMBULATORY_CARE_PROVIDER_SITE_OTHER): Payer: Medicare HMO | Admitting: Nurse Practitioner

## 2017-05-28 VITALS — BP 134/63 | HR 68 | Temp 98.4°F | Ht 69.0 in | Wt 188.4 lb

## 2017-05-28 DIAGNOSIS — R251 Tremor, unspecified: Secondary | ICD-10-CM | POA: Diagnosis not present

## 2017-05-28 DIAGNOSIS — G4489 Other headache syndrome: Secondary | ICD-10-CM | POA: Diagnosis not present

## 2017-05-28 MED ORDER — SUMATRIPTAN 5 MG/ACT NA SOLN: 1.0000 | NASAL | 2 refills | Status: DC | PRN

## 2017-05-28 MED ORDER — PROPRANOLOL HCL ER 80 MG PO CP24: 80.0000 mg | ORAL_CAPSULE | Freq: Every day | ORAL | 1 refills | Status: DC

## 2017-05-28 MED ORDER — PROPRANOLOL HCL 40 MG PO TABS: 40.0000 mg | ORAL_TABLET | Freq: Two times a day (BID) | ORAL | 1 refills | Status: DC

## 2017-05-28 NOTE — Progress Notes (Signed)
Subjective:    Patient ID: Jackson [name], male    DOB: 02-01-1946, 72 y.o.   MRN: [id number]  Jackson [name] is a 72 y.o. male presenting on 05/28/2017 for Headache (causing blurry vision, balance, and dizziness. persistent headaches 3 mths. Pt seen at [facility] for these symptoms )   HPI Headache Pt presents today after initial evaluation and imaging by two doctors at the [place].  He has not yet had resolution of his symptoms and requests a second opinion.  Headaches started in late October and are described as a band of pressure/aching from ear to ear over the top of his head.  Daily headache for the last 2 months - 10 weeks and it always worsens in afternoon.  In morning, is not accompanied by symptoms other than mild dizziness and mild tremor.  As the headache worsens, he also has dizziness, blurred vision, slurred speech, and resting tremor of hands and arms. - The dizziness has caused him to fall down steps 3 times since early December. - Associated symptoms include phonophobia and photophobia, mild nausea but no vomiting. - 800 mg ibuprofen is only minimally helping his headache pain, but not associated symptoms. - Acetaminophen is not helping pain at all. - Has tried no other medications for headaches.  - Mastoid effusion bilaterally noted on head CT scan. Pt reports no past ear infection or current ear pain/hearing loss.   Social History   Tobacco Use  . Smoking status: Never Smoker  . Smokeless tobacco: Never Used  Substance Use Topics  . Alcohol use: No    Comment: former  . Drug use: No    Review of Systems Per HPI unless specifically indicated above     Objective:    BP 134/63 (BP Location: Left Arm, Patient Position: Sitting, Cuff Size: Normal)   Pulse 68   Temp 98.4 F (36.9 C) (Oral)   Ht 5\' 9"  (1.753 m)   Wt 188 lb 6.4 oz (85.5 kg)   BMI 27.82 kg/m   Wt Readings from Last 3 Encounters:  05/28/17 188 lb 6.4 oz (85.5 kg)  10/14/16 187 lb 6.4 oz (85 kg)  10/09/16  189 lb (85.7 kg)    Physical Exam  Constitutional: He is oriented to person, place, and time. He appears well-developed and well-nourished. He appears distressed (mild).  HENT:  Head: Normocephalic and atraumatic.  Right Ear: No tenderness. No mastoid tenderness (from level of top of external ear across top of head to top of opposite external ear as a headband.  No mastoid process tenderness.). No middle ear effusion. Decreased hearing (no changes w/ headache) is noted.  Left Ear: No tenderness. No mastoid tenderness (no mastoid process tenderness).  No middle ear effusion. Decreased hearing (no changes w/ headache) is noted.  Nose: Nose normal.  Mouth/Throat: Oropharynx is clear and moist.  Eyes: Conjunctivae and EOM are normal. Pupils are equal, round, and reactive to light.  Neck: Normal range of motion. Neck supple. No thyromegaly present.  Cardiovascular: Normal rate, regular rhythm, normal heart sounds and intact distal pulses.  Pulmonary/Chest: Effort normal and breath sounds normal. No respiratory distress.  Abdominal: Soft. Bowel sounds are normal. He exhibits no distension.  Musculoskeletal: He exhibits no edema.  Neurological: He is alert and oriented to person, place, and time. He has normal strength and normal reflexes. No cranial nerve deficit or sensory deficit. Coordination (positive romberg) and gait (antalgic gait r/t knee injection, but cannot also exclude other staggering gait as  pt is very guarded with walking) abnormal.  Skin: Skin is warm and dry.  Psychiatric: He has a normal mood and affect. His behavior is normal. Judgment and thought content normal.  Vitals reviewed.   03/25/2017: CT Scan head w/o contrast[facility].  No images available to review Calvarium/skull bas: No evidence of fracture or destructive lesion.  Mastoids and middle ear grossly clear. Paranasal sinuses: No significant disease in the partially visualized paranasal  sinuses. Brain: No evidence of acute large vascular territory infarct.  No mass effect, mass lesion, acute hemorrhage, or hydrocephalus.  Small hypodensities in both basal ganglia. Additional findings: Right occipital scalp lipoma.  Impression: Small hypodensities in both basal ganglia may reflect age indeterminate lacunar infarcts, thought they are favored to reflect old lacunar infarcts, though they are favored to reflect old lacunar infarcts or dilated perivascular spaces.  MRI Could provide further evaluation if there is clinic concern for recent ischemia/infarction.   04/29/2017: MRI head w/ w/o contrast:  [facility] transcription.  No images available to review  Mild microvascular changes. No cerebral, intrasellar or pineal mass.No acute cerebral ischemia.  Small perivascular spaces inferior to the basal ganglia; no lacunar infarction.  No abnormal contrast enhancement. No hydrocephalus.  No extracerebral mass or pathologic fluid collection is evident.  No Chiari malformation.  The major vascular structures appear patent.  The skull base and calvarium appear intact.  Bilateral mastoid effusions.  No paranasal sinus effusion.  No intraorbital or nasopharyngeal mass.  [id number] right paramedian occipital scalp lipoma.  Impression: 1. mild microvascular disease 2. Mastoid effusions.  Clinical correlation recommended    Assessment & Plan:   Problem List Items Addressed This Visit    None    Visit Diagnoses    Other headache syndrome    -  Primary Patient with subacute to chronic daily headache with onset at end of October.  Pt has had prior evaluation by [facility] physicians, but has not had resolution or perceived adequate treatment of his symptoms. - CT scan and MRI are not suggesting any acute pathology or new stroke, hydrocephalus, or Chiari malformations - Headache is associated with additional neuro symptoms not completely explained by negative imaging to include dizziness, resting  tremor, phono/photophobia, positive romberg and intractable pain  Plan: 1. START sumatriptan 5 mg/act nasal spray.  Use one spray every 2 hours up to 3 doses max in 24 hours for headache.  If not helpful after first 3 doses, may not likely help relieve headache pain. 2. START propranolol 40 mg twice daily (increase to tid if needed as propranolol 24 hr tab 80 mg was not covered by pt insurance). 3. Continue acetaminophen as needed. 4. Referral to neurology for additional evaluation.   Relevant Medications   Acetaminophen 325 MG CAPS   SUMAtriptan (IMITREX) 5 MG/ACT nasal spray   propranolol (INDERAL) 40 MG tablet   Other Relevant Orders   Ambulatory referral to Neurology   Tremor of both hands     Tremor associated with headache and worsens throughout the day as headache worsens.  See ap headache above for additional details.  Plan: 1. START propranolol 40 mg bid for tremor. 2. Referral to neurology.   Relevant Medications   propranolol (INDERAL) 40 MG tablet   Other Relevant Orders   Ambulatory referral to Neurology      Meds ordered this encounter  Medications  . propranolol ER (INDERAL LA) 80 MG 24 hr capsule    Sig: Take 1 capsule (  80 mg total) by mouth daily.    Dispense:  30 capsule    Refill:  1    Order Specific Question:   Supervising Provider    Answer:   Olin Hauser [[id number]]  . SUMAtriptan (IMITREX) 5 MG/ACT nasal spray    Sig: Place 1 spray (5 mg total) into the nose every 2 (two) hours as needed for migraine. Up to max of 3 doses in 24 hours.    Dispense:  1 Inhaler    Refill:  2    Order Specific Question:   Supervising Provider    Answer:   Olin Hauser [[id number]]      Follow up plan: Return in about 4 weeks (around 06/25/2017) for headaches.   Cassell Smiles, DNP, AGPCNP-BC Adult Gerontology Primary Care Nurse Practitioner [facility] 05/28/2017, 11:56 AM

## 2017-05-28 NOTE — Patient Instructions (Addendum)
Jackson [name], Thank you for coming in to clinic today.  1. Your headache is possibly a migraine or it is related to the fluid on your mastoid bone.  We will start treatment for migraines and your tremor. - START propranolol 24 hr tablet.  Take one 80 mg tablet once daily. - START sumatriptan nasal spray.  Use one spray in one nostril every 2 hours for max of 3 doses in 24 hours.  2. I am placing a referral for neurology as well.   [facility] clinic neurology will call you with your appointment.  Please schedule a follow-up appointment with Cassell Smiles, AGNP.  Return in about 4 weeks (around 06/25/2017) for headaches.  If you have any other questions or concerns, please feel free to call the clinic or send a message through [place]. You may also schedule an earlier appointment if necessary.  You will receive a survey after today's visit either digitally by e-mail or paper by [employer]. Your experiences and feedback matter to [place].  Please respond so we know how we are doing as we provide care for you.   Cassell Smiles, DNP, AGNP-BC Adult Gerontology Nurse Practitioner [place]

## 2017-05-29 ENCOUNTER — Encounter: Payer: Self-pay | Admitting: Nurse Practitioner

## 2017-06-23 DIAGNOSIS — Z8249 Family history of ischemic heart disease and other diseases of the circulatory system: Secondary | ICD-10-CM | POA: Diagnosis not present

## 2017-06-23 DIAGNOSIS — G609 Hereditary and idiopathic neuropathy, unspecified: Secondary | ICD-10-CM | POA: Diagnosis not present

## 2017-06-23 DIAGNOSIS — G8929 Other chronic pain: Secondary | ICD-10-CM | POA: Diagnosis not present

## 2017-06-23 DIAGNOSIS — R69 Illness, unspecified: Secondary | ICD-10-CM | POA: Diagnosis not present

## 2017-06-23 DIAGNOSIS — R51 Headache: Secondary | ICD-10-CM | POA: Diagnosis not present

## 2017-06-23 DIAGNOSIS — M545 Low back pain: Secondary | ICD-10-CM | POA: Diagnosis not present

## 2017-06-25 ENCOUNTER — Other Ambulatory Visit: Payer: Self-pay | Admitting: Neurology

## 2017-06-25 DIAGNOSIS — R51 Headache: Secondary | ICD-10-CM

## 2017-06-25 DIAGNOSIS — Z8249 Family history of ischemic heart disease and other diseases of the circulatory system: Secondary | ICD-10-CM

## 2017-06-25 DIAGNOSIS — R519 Headache, unspecified: Secondary | ICD-10-CM

## 2017-06-29 ENCOUNTER — Other Ambulatory Visit: Payer: Self-pay

## 2017-06-29 ENCOUNTER — Encounter: Payer: Self-pay | Admitting: Nurse Practitioner

## 2017-06-29 ENCOUNTER — Ambulatory Visit (INDEPENDENT_AMBULATORY_CARE_PROVIDER_SITE_OTHER): Payer: Medicare HMO | Admitting: Nurse Practitioner

## 2017-06-29 VITALS — BP 133/56 | HR 64 | Temp 98.0°F | Ht 69.0 in | Wt 183.2 lb

## 2017-06-29 DIAGNOSIS — H7093 Unspecified mastoiditis, bilateral: Secondary | ICD-10-CM

## 2017-06-29 DIAGNOSIS — R51 Headache: Secondary | ICD-10-CM | POA: Diagnosis not present

## 2017-06-29 DIAGNOSIS — R519 Headache, unspecified: Secondary | ICD-10-CM | POA: Insufficient documentation

## 2017-06-29 NOTE — Patient Instructions (Addendum)
Jackson [name], Thank you for coming in to clinic today.  1. Please make sure you follow up with Infectious Diseases. You have larger effusions of your mastoid bones.   - You can complete/finish your neurology followup after MRA is completed if it does not show any additional cause for your headaches.  2. Try melatonin 3 mg at night about 1 hour before bed.  3. Take ibuprofen 800 mg up to 3 times daily.  Take acetaminophen 1,000 mg up to 3 times daily.  Please schedule a follow-up appointment with Cassell Smiles, AGNP. Return 4-6 weeks if symptoms worsen or fail to improve.  If you have any other questions or concerns, please feel free to call the clinic or send a message through [place]. You may also schedule an earlier appointment if necessary.  You will receive a survey after today's visit either digitally by e-mail or paper by [employer]. Your experiences and feedback matter to [place].  Please respond so we know how we are doing as we provide care for you.   Cassell Smiles, DNP, AGNP-BC Adult Gerontology Nurse Practitioner [place]

## 2017-06-29 NOTE — Progress Notes (Signed)
Subjective:    Patient ID: Jackson [name], male    DOB: 23-Sep-1945, 72 y.o.   MRN: [id number]  Jackson [name] is a 72 y.o. male presenting on 06/29/2017 for Headache (growth behind both ears thats cause dizziness and headaches ) and Hospitalization Follow-up (Mastoid effusion bilaterally )   HPI Headache/Bilateral Mastoid Effusion Pt presents today for followup of headache evaluation.  Pt [facility], so he also went to his pt advocate for Primary care 2/2 not getting feedback about head scans.  They recommended he go to ED.  ED physician recommended ENT or ID for treatment, but did not admit for IV antibiotics.  To date no ENT or ID referrals have been made and no appointment has been obtained in [place] system for further workup.  Pt has been seen by Neurology.  Pending MRA for rule out of aneurysm.  Family history of aneurysm.  Is still having daily band-like headaches from ear to ear across top of head.  Is occasionally sore in mastoid bones.  Had gotten antibiotics several months ago, but didn't make a difference in symptoms. - Drew additional blood tests in ER and pt reports "WBC was normal." - Gabapentin in past has made no difference. - Only relief of pain occurs when pt takes 2 tablets 800 mg ibuprofen at 8:30-9pm.  Only slight relief of headache intensity some and helps relax a little. - 1.5-2 hrs sleep at a time before awakening from headache. - Pt is having some blurry vision and occasional dizziness.  These worsen as day progresses. - Also reports electrical shock sensation across scalp occasionally.   Social History   Tobacco Use  . Smoking status: Never Smoker  . Smokeless tobacco: Never Used  Substance Use Topics  . Alcohol use: No    Comment: former  . Drug use: No    Review of Systems Per HPI unless specifically indicated above     Objective:    BP (!) 133/56 (BP Location: Right Arm, Patient Position: Sitting, Cuff  Size: Normal)   Pulse 64   Temp 98 F (36.7 C) (Oral)   Ht 5\' 9"  (1.753 m)   Wt 183 lb 3.2 oz (83.1 kg)   BMI 27.05 kg/m     Wt Readings from Last 3 Encounters:  06/29/17 183 lb 3.2 oz (83.1 kg)  05/28/17 188 lb 6.4 oz (85.5 kg)  10/14/16 187 lb 6.4 oz (85 kg)    Physical Exam  General - healthy weight, well-appearing, NAD HEENT - Atraumatic - worsening effusion bilateral mastoid bones noted compared to 05/28/17, R occipital bone with effusion/edema, Hair is tender to touch over entire scalp, but worst over R temporal bone.  Palpation elicits electrical impulses/shock sensation. Neck - supple, non-tender, no LAD, no thyromegaly, no carotid bruit Heart - RRR, no murmurs heard Lungs - Clear throughout all lobes, no wheezing, crackles, or rhonchi. Normal work of breathing. Extremeties - non-tender, no edema, cap refill < 2 seconds, peripheral pulses intact +2 bilaterally Skin - warm, dry Neuro - Neuro - awake, alert, oriented x3, CN II-X intact, intact muscle strength 5/5 bilaterally, intact distal sensation to light touch, normal coordination, normal gait Psych - Normal mood and affect, normal behavior    Results for orders placed or performed in visit on 10/02/16  ECHOCARDIOGRAM STRESS TEST  Result Value Ref Range   Rest HR 75 bpm   Rest BP 166/78 mmHg   Exercise duration (sec) 6 sec  Percent HR 82 %   Exercise duration (min) 3 min   Estimated workload 4.7 METS   Peak HR 123 bpm   Peak BP 224/56 mmHg   MPHR 149 bpm      Assessment & Plan:   Problem List Items Addressed This Visit      Musculoskeletal and Integument   Mastoiditis of both sides - Primary   Relevant Orders   Ambulatory referral to ENT     Other   Bilateral headaches    Interval worsening of effusion with visible swelling of and significantly worsening pain on palpation of mastoid processes, temporal bones, R occipital bone.  Pt with persistent daily headache, blurry vision, dizziness and no previously  identified cause except bilateral mastoid effusions on imaging approx 2 months ago.  Pt with initial workup at [facility] clinic in [place].  No followup from them has occurred.  Pt presented to me on 05/28/17 and proceeded with neurology eval.  Diff dx - infection, brain abscess vs aneurysm/clot as cause of headaches.  Call placed to Rufina Falco, [facility] to briefly discuss findings of today's exam.  She and Dr. Manuella Ghazi recommend concurrent ENT workup alongside Neuro workup since effusions are worsening and pt is still largely neuro asymptomatic and no focal identifiable infections.  Recommend to hold off on infectious diseases unless infection is identified with normal WBC.     Plan: 1. Again offered gabapentin/lyrica to pt and was declines as they reportedly have not helped in past. 2. Continue to monitor for worsening symptoms and call clinic if they occur. 3. Referral urgently to ENT. 4. Continue plan for MRA and completing neuro workup. 5. Followup in clinic here at [facility] as needed in 4-6 weeks after specialist workup is complete.  Follow up plan: Return 4-6 weeks if symptoms worsen or fail to improve.   A total of 25 minutes was spent face-to-face with this patient. Greater than 50% of this time was spent in counseling and coordination of care with the patient.    Cassell Smiles, DNP, AGPCNP-BC Adult Gerontology Primary Care Nurse Practitioner [facility] 06/29/2017, 8:05 AM

## 2017-07-02 ENCOUNTER — Ambulatory Visit: Payer: Self-pay

## 2017-07-03 DIAGNOSIS — R51 Headache: Secondary | ICD-10-CM | POA: Diagnosis not present

## 2017-07-03 DIAGNOSIS — H6980 Other specified disorders of Eustachian tube, unspecified ear: Secondary | ICD-10-CM | POA: Diagnosis not present

## 2017-07-06 ENCOUNTER — Ambulatory Visit
Admission: RE | Admit: 2017-07-06 | Discharge: 2017-07-06 | Disposition: A | Discharge status: Home / Self Care | Payer: Medicare HMO | Source: Ambulatory Visit | Attending: Neurology | Admitting: Neurology

## 2017-07-06 DIAGNOSIS — R519 Headache, unspecified: Secondary | ICD-10-CM

## 2017-07-06 DIAGNOSIS — R42 Dizziness and giddiness: Secondary | ICD-10-CM | POA: Diagnosis not present

## 2017-07-06 DIAGNOSIS — R51 Headache: Secondary | ICD-10-CM | POA: Diagnosis not present

## 2017-07-06 DIAGNOSIS — Z8249 Family history of ischemic heart disease and other diseases of the circulatory system: Secondary | ICD-10-CM | POA: Insufficient documentation

## 2017-08-27 DIAGNOSIS — R69 Illness, unspecified: Secondary | ICD-10-CM | POA: Diagnosis not present

## 2017-08-27 DIAGNOSIS — G609 Hereditary and idiopathic neuropathy, unspecified: Secondary | ICD-10-CM | POA: Diagnosis not present

## 2017-08-27 DIAGNOSIS — R51 Headache: Secondary | ICD-10-CM | POA: Diagnosis not present

## 2017-09-02 NOTE — Progress Notes (Signed)
[facility] Consultation      Assessment and Plan:  Dyspnea.  --Uncertain etiology.  --CXR was normal, will check a pulmonary function test.  --Encouraged to try to increase his activity level.  --Will give sample of Anoro, did not help.  -If all testing is negative, I suspect that the patient likely lost a lot of strength over time.   Orders Placed This Encounter  Procedures  . Pulmonary Function Test [facility] Only   Return in about 1 month (around 10/01/2017).   Date: 09/02/2017  MRN# [id number] PIERRE DELLAROCCO 02/22/1946   Jackson [name] is a 72 y.o. old male seen in consultation for chief complaint of:    Chief Complaint  Patient presents with  . Shortness of Breath    unchanged since last visit.    HPI:  The patient is a 72 year old male with a history of Rocky Mountain spotted fever.  He had noted that he has been having progressive dyspnea over the past few months, he previously been very active, however he was having increasing difficulty with doing usual daily activities.  No testing was available for review at that time, he was therefore sent for chest x-ray and pulmonary function testing. CXR was normal.   He was also given an Anoro inhaler but did not help. He still gets winded with carrying a 50 lb bag of dog food or running weed eater also causes similar symptoms. His [facility] told him that he is getting old but he does not believe it.  They did an echocardiogram which he tells me was normal.    Desat walk 10/09/16: Baseline oxygen sat on RA at rest was 97% and HR 70. After 360 feet sat was 98% and HR 87; minimal dyspnea, walked briskly.   **Pneumonia vaccine - in nov or dec 2017 **Chest x-ray 10/09/16, imaging personally reviewed, lungs are unremarkable.  Mild hyperinflation. **Echo 10/02/16;  - The estimated LV ejection fraction was 60-65%. - Normal wall motion; no LV regional wall motion abnormalities. Left ventricular ejection fraction  was normal at rest and with stress. Normal echo stress though suboptimal as target heart rate not achieved.  Social Hx:   Social History   Tobacco Use  . Smoking status: Never Smoker  . Smokeless tobacco: Never Used  Substance Use Topics  . Alcohol use: No    Comment: former  . Drug use: No   Medication:    Current Outpatient Medications:  .  Acetaminophen 325 MG CAPS, Take by mouth., Disp: , Rfl:  .  aspirin 81 MG chewable tablet, Chew 81 mg by mouth daily., Disp: , Rfl:  .  B Complex Vitamins (VITAMIN B COMPLEX PO), Take by mouth., Disp: , Rfl:  .  cholecalciferol (VITAMIN D) 1000 units tablet, Take 1,000 Units by mouth daily., Disp: , Rfl:  .  cholestyramine (QUESTRAN) 4 GM/DOSE powder, Take 4 g by mouth 2 (two) times daily with a meal., Disp: , Rfl:  .  clindamycin (CLEOCIN) 150 MG capsule, Take by mouth., Disp: , Rfl:  .  cyanocobalamin 500 MCG tablet, Take 500 mcg by mouth daily., Disp: , Rfl:  .  fluocinonide cream (LIDEX) 3.71 %, Apply 1 application topically 2 (two) times daily., Disp: , Rfl:  .  hydrOXYzine (VISTARIL) 50 MG capsule, Take 50 mg by mouth at bedtime., Disp: , Rfl:  .  ibuprofen (ADVIL,MOTRIN) 800 MG tablet, Take 800 mg by mouth 2 (two) times daily as needed (with food)., Disp: ,  Rfl:  .  Magnesium 500 MG CAPS, Take 500 mg by mouth daily., Disp: , Rfl:  .  Magnesium Oxide, Antacid, 500 MG CAPS, Take by mouth., Disp: , Rfl:  .  methocarbamol (ROBAXIN) 500 MG tablet, Take 1,000 mg by mouth 2 (two) times daily., Disp: , Rfl:  .  Omega-3 Fatty Acids (FISH OIL) 1000 MG CAPS, Take 1,000 mg by mouth daily., Disp: , Rfl:  .  propranolol (INDERAL) 40 MG tablet, Take 1 tablet (40 mg total) by mouth 2 (two) times daily. (Patient not taking: Reported on 06/29/2017), Disp: 60 tablet, Rfl: 1 .  SUMAtriptan (IMITREX) 5 MG/ACT nasal spray, Place 1 spray (5 mg total) into the nose every 2 (two) hours as needed for migraine. Up to max of 3 doses in 24 hours. (Patient not taking:  Reported on 06/29/2017), Disp: 1 Inhaler, Rfl: 2   Allergies:  Penicillins  Review of Systems: Gen:  Denies  fever, sweats, chills HEENT: Denies blurred vision, double vision. bleeds, sore throat Cvc:  No dizziness, chest pain. Resp:   Denies cough or sputum production, shortness of breath Gi: Denies swallowing difficulty, stomach pain. Gu:  Denies bladder incontinence, burning urine Ext:   No Joint pain, stiffness. Skin: No skin rash,  hives  Endoc:  No polyuria, polydipsia. Psych: No depression, insomnia. Other:  All other systems were reviewed with the patient and were negative other that what is mentioned in the HPI.   Physical Examination:   VS: BP 132/78 (BP Location: Left Arm, Cuff Size: Normal)   Pulse 77   Resp 16   Ht 5\' 9"  (1.753 m)   Wt 193 lb (87.5 kg)   SpO2 100%   BMI 28.50 kg/m   General Appearance: No distress  Neuro:without focal findings,  speech normal,  HEENT: PERRLA, EOM intact.   Pulmonary: normal breath sounds, No wheezing.  CardiovascularNormal S1,S2.  No m/r/g.   Abdomen: Benign, Soft, non-tender. Renal:  No costovertebral tenderness  GU:  No performed at this time. Endoc: No evident thyromegaly, no signs of acromegaly. Skin:   warm, no rashes, no ecchymosis  Extremities: normal, no cyanosis, clubbing.  Other findings:    LABORATORY PANEL:   CBC No results for input(s): WBC, HGB, HCT, PLT in the last 168 hours. ------------------------------------------------------------------------------------------------------------------  Chemistries  No results for input(s): NA, K, CL, CO2, GLUCOSE, BUN, CREATININE, CALCIUM, MG, AST, ALT, ALKPHOS, BILITOT in the last 168 hours.  Invalid input(s): GFRCGP ------------------------------------------------------------------------------------------------------------------  Cardiac Enzymes No results for input(s): TROPONINI in the last 168  hours. ------------------------------------------------------------  RADIOLOGY:  No results found.     Thank  you for the consultation and for allowing [facility] to assist in the care of your patient. Our recommendations are noted above.  Please contact us if we can be of further service.   Marda Stalker, MD.  Board Certified in Internal Medicine, Pulmonary Medicine, [place], and Sleep Medicine.  [facility] Office Number: [phone]  Patricia Pesa, M.D.  Merton Border, M.D  09/02/2017

## 2017-09-03 ENCOUNTER — Encounter: Payer: Self-pay | Admitting: Internal Medicine

## 2017-09-03 ENCOUNTER — Ambulatory Visit: Payer: Medicare HMO | Admitting: Internal Medicine

## 2017-09-03 ENCOUNTER — Encounter: Payer: Self-pay | Admitting: Nurse Practitioner

## 2017-09-03 VITALS — BP 132/78 | HR 77 | Resp 16 | Ht 69.0 in | Wt 193.0 lb

## 2017-09-03 DIAGNOSIS — R0609 Other forms of dyspnea: Secondary | ICD-10-CM

## 2017-09-03 NOTE — Patient Instructions (Addendum)
Will send you for a pulmonary function test.  Try taking Anoro 1 hour before physical activity.

## 2017-09-24 ENCOUNTER — Ambulatory Visit: Payer: Medicare HMO | Attending: Internal Medicine

## 2017-09-24 ENCOUNTER — Ambulatory Visit: Payer: Medicare HMO

## 2017-09-24 DIAGNOSIS — R0609 Other forms of dyspnea: Secondary | ICD-10-CM | POA: Diagnosis not present

## 2017-09-24 MED ORDER — ALBUTEROL SULFATE (2.5 MG/3ML) 0.083% IN NEBU
2.5000 mg | INHALATION_SOLUTION | Freq: Once | RESPIRATORY_TRACT | Status: AC
Start: 2017-09-24 — End: ?
  Filled 2017-09-24: qty 3

## 2017-09-30 NOTE — Progress Notes (Signed)
[facility] Consultation      Assessment and Plan:  Dyspnea.  --Patient's pulmonary function tests were reviewed with the patient which showed normal lung function without evidence of COPD/emphysema. - Patient had no symptomatic improvement with inhaler, making asthma less likely.  Therefore I am doubtful the patient has any significant pulmonary disease contributing to his dyspnea. - We did discuss that he could be coming "an old fart", however we should make sure that he does not have cardiac disease contributing to his dyspnea.  We offered referral to a cardiologist, he is going to see if he can get in at the [place] if not he will call us back and we can refer to our cardiologist here.   Return if symptoms worsen.   Date: 09/30/2017  MRN# [id number] Jackson [name] 1946-05-04   Jackson [name] is a 72 y.o. old male seen in consultation for chief complaint of:    Chief Complaint  Patient presents with  . Follow-up    SOB w/activity: Chest tightness: prod cough    HPI:  The patient is a 72 year old male with a history of Rocky Mountain spotted fever.   Today he notes that he continues to have dyspnea when carrying 50 lb pound of dog food around his house. He also notes this when he walks up hills or walking a lot.  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 He did not notice any difference with the Anoro inhaler.     **PFT 09/24/2017; forced expiratory time is 10 seconds, FVC is 110% predicted, FEV1 is 102% predicted, ratio 73%.  Flow volume loop unremarkable.  There is no improvement with bronchodilator TLC is 103% predicted, ratio is normal.  DLCO is normal. - Overall this test shows normal pulmonary functions without evidence of COPD/obstructive lung disease. **Desat walk 10/09/16: Baseline oxygen sat on RA at rest was 97% and HR 70. After 360 feet sat was 98% and HR 87; minimal dyspnea, walked briskly.  **Pneumonia vaccine - in nov  or dec 2017 **Chest x-ray 10/09/16, imaging personally reviewed, lungs are unremarkable.  Mild hyperinflation. **Echo 10/02/16;  - The estimated LV ejection fraction was 60-65%. - Normal wall motion; no LV regional wall motion abnormalities. Left ventricular ejection fraction was normal at rest and with stress. Normal echo stress though suboptimal as target heart rate not achieved.  Social Hx:   Social History   Tobacco Use  . Smoking status: Never Smoker  . Smokeless tobacco: Never Used  Substance Use Topics  . Alcohol use: No    Comment: former  . Drug use: No   Medication:    Current Outpatient Medications:  .  Acetaminophen 325 MG CAPS, Take by mouth., Disp: , Rfl:  .  aspirin 81 MG chewable tablet, Chew 81 mg by mouth daily., Disp: , Rfl:  .  B Complex Vitamins (VITAMIN B COMPLEX PO), Take by mouth., Disp: , Rfl:  .  cholecalciferol (VITAMIN D) 1000 units tablet, Take 1,000 Units by mouth daily., Disp: , Rfl:  .  cholestyramine (QUESTRAN) 4 GM/DOSE powder, Take 4 g by mouth 2 (two) times daily with a meal., Disp: , Rfl:  .  clindamycin (CLEOCIN) 150 MG capsule, Take by mouth., Disp: , Rfl:  .  cyanocobalamin 500 MCG tablet, Take 500 mcg by mouth daily., Disp: , Rfl:  .  fluocinonide cream (LIDEX) 5.63 %, Apply 1 application topically 2 (two) times daily., Disp: , Rfl:  .  hydrOXYzine (VISTARIL) 50 MG capsule, Take 50 mg by mouth at bedtime., Disp: , Rfl:  .  ibuprofen (ADVIL,MOTRIN) 800 MG tablet, Take 800 mg by mouth 2 (two) times daily as needed (with food)., Disp: , Rfl:  .  Magnesium 500 MG CAPS, Take 500 mg by mouth daily., Disp: , Rfl:  .  Magnesium Oxide, Antacid, 500 MG CAPS, Take by mouth., Disp: , Rfl:  .  methocarbamol (ROBAXIN) 500 MG tablet, Take 1,000 mg by mouth 2 (two) times daily., Disp: , Rfl:  .  Omega-3 Fatty Acids (FISH OIL) 1000 MG CAPS, Take 1,000 mg by mouth daily., Disp: , Rfl:  .  propranolol (INDERAL) 40 MG tablet, Take 1 tablet (40 mg total) by  mouth 2 (two) times daily. (Patient not taking: Reported on 06/29/2017), Disp: 60 tablet, Rfl: 1 .  SUMAtriptan (IMITREX) 5 MG/ACT nasal spray, Place 1 spray (5 mg total) into the nose every 2 (two) hours as needed for migraine. Up to max of 3 doses in 24 hours. (Patient not taking: Reported on 06/29/2017), Disp: 1 Inhaler, Rfl: 2 No current facility-administered medications for this visit.   Facility-Administered Medications Ordered in Other Visits:  .  albuterol (PROVENTIL) (2.5 MG/3ML) 0.083% nebulizer solution 2.5 mg, 2.5 mg, Nebulization, Once, Laverle Hobby, MD   Allergies:  Penicillins  Review of Systems: Gen:  Denies  fever, sweats, chills HEENT: Denies blurred vision, double vision. bleeds, sore throat Cvc:  No dizziness, chest pain. Resp:   Denies cough or sputum production. Gi: Denies swallowing difficulty, stomach pain. Gu:  Denies bladder incontinence, burning urine Ext:   No Joint pain, stiffness. Skin: No skin rash,  hives  Endoc:  No polyuria, polydipsia. Psych: No depression, insomnia. Other:  All other systems were reviewed with the patient and were negative other that what is mentioned in the HPI.   Physical Examination:   VS: BP (!) 148/70 (BP Location: Left Arm, Cuff Size: Normal)   Pulse 71   Ht 5\' 9"  (1.753 m)   Wt 191 lb (86.6 kg)   SpO2 100%   BMI 28.21 kg/m   General Appearance: No distress  Neuro:without focal findings,  speech normal,  HEENT: PERRLA, EOM intact.   Pulmonary: normal breath sounds, No wheezing.  CardiovascularNormal S1,S2.  No m/r/g.   Abdomen: Benign, Soft, non-tender. Renal:  No costovertebral tenderness  GU:  No performed at this time. Endoc: No evident thyromegaly, no signs of acromegaly. Skin:   warm, no rashes, no ecchymosis  Extremities: normal, no cyanosis, clubbing.  Other findings:    LABORATORY PANEL:   CBC No results for input(s): WBC, HGB, HCT, PLT in the last 168  hours. ------------------------------------------------------------------------------------------------------------------  Chemistries  No results for input(s): NA, K, CL, CO2, GLUCOSE, BUN, CREATININE, CALCIUM, MG, AST, ALT, ALKPHOS, BILITOT in the last 168 hours.  Invalid input(s): GFRCGP ------------------------------------------------------------------------------------------------------------------  Cardiac Enzymes No results for input(s): TROPONINI in the last 168 hours. ------------------------------------------------------------  RADIOLOGY:  No results found.     Thank  you for the consultation and for allowing [facility] to assist in the care of your patient. Our recommendations are noted above.  Please contact us if we can be of further service.   Marda Stalker, MD.  Board Certified in Internal Medicine, Pulmonary Medicine, [place], and Sleep Medicine.  [facility] Office Number: [phone]  Patricia Pesa, M.D.  Merton Border, M.D  09/30/2017

## 2017-10-02 ENCOUNTER — Ambulatory Visit: Payer: Medicare HMO | Admitting: Internal Medicine

## 2017-10-02 ENCOUNTER — Encounter: Payer: Self-pay | Admitting: Internal Medicine

## 2017-10-02 VITALS — BP 148/70 | HR 71 | Ht 69.0 in | Wt 191.0 lb

## 2017-10-02 DIAGNOSIS — R0609 Other forms of dyspnea: Secondary | ICD-10-CM

## 2017-10-02 NOTE — Patient Instructions (Addendum)
Your lungs are normal, you do not have COPD/emphysema. We will refer you to a heart doctor (if you are not following up at the [place]).

## 2017-10-15 ENCOUNTER — Telehealth: Payer: Self-pay | Admitting: Nurse Practitioner

## 2017-10-15 NOTE — Telephone Encounter (Signed)
Called to schedule MWV.  Last MWV was 10-14-16

## 2017-11-26 DIAGNOSIS — R51 Headache: Secondary | ICD-10-CM | POA: Diagnosis not present

## 2017-11-26 DIAGNOSIS — G609 Hereditary and idiopathic neuropathy, unspecified: Secondary | ICD-10-CM | POA: Diagnosis not present

## 2017-11-26 DIAGNOSIS — H9203 Otalgia, bilateral: Secondary | ICD-10-CM | POA: Diagnosis not present

## 2017-11-26 DIAGNOSIS — R69 Illness, unspecified: Secondary | ICD-10-CM | POA: Diagnosis not present

## 2017-12-01 ENCOUNTER — Ambulatory Visit: Payer: Medicare HMO

## 2017-12-15 ENCOUNTER — Ambulatory Visit: Payer: Medicare HMO

## 2017-12-22 ENCOUNTER — Telehealth: Payer: Self-pay

## 2017-12-22 NOTE — Telephone Encounter (Signed)
Called to reschedule missed medicare annual wellness visit from 12/15/2017. Patient might also need CPE - can be same day if needed.

## 2017-12-28 ENCOUNTER — Emergency Department: Payer: Medicare HMO

## 2017-12-28 ENCOUNTER — Other Ambulatory Visit: Payer: Self-pay

## 2017-12-28 ENCOUNTER — Emergency Department
Admission: EM | Admit: 2017-12-28 | Discharge: 2017-12-29 | Disposition: A | Discharge status: Other Acute Inpatient Hospital | Payer: Medicare HMO | Attending: Emergency Medicine | Admitting: Emergency Medicine

## 2017-12-28 DIAGNOSIS — R112 Nausea with vomiting, unspecified: Secondary | ICD-10-CM | POA: Insufficient documentation

## 2017-12-28 DIAGNOSIS — R509 Fever, unspecified: Secondary | ICD-10-CM | POA: Insufficient documentation

## 2017-12-28 DIAGNOSIS — N200 Calculus of kidney: Secondary | ICD-10-CM | POA: Diagnosis not present

## 2017-12-28 DIAGNOSIS — R410 Disorientation, unspecified: Secondary | ICD-10-CM | POA: Diagnosis not present

## 2017-12-28 DIAGNOSIS — R51 Headache: Secondary | ICD-10-CM | POA: Diagnosis not present

## 2017-12-28 DIAGNOSIS — K8020 Calculus of gallbladder without cholecystitis without obstruction: Secondary | ICD-10-CM

## 2017-12-28 DIAGNOSIS — R1011 Right upper quadrant pain: Secondary | ICD-10-CM | POA: Diagnosis not present

## 2017-12-28 DIAGNOSIS — Z79899 Other long term (current) drug therapy: Secondary | ICD-10-CM | POA: Insufficient documentation

## 2017-12-28 DIAGNOSIS — N202 Calculus of kidney with calculus of ureter: Secondary | ICD-10-CM | POA: Diagnosis not present

## 2017-12-28 DIAGNOSIS — R109 Unspecified abdominal pain: Secondary | ICD-10-CM

## 2017-12-28 LAB — COMPREHENSIVE METABOLIC PANEL
ALBUMIN: 3.2 g/dL — AB (ref 3.5–5.0)
ALT: 9 U/L (ref 0–44)
ANION GAP: 7 (ref 5–15)
AST: 30 U/L (ref 15–41)
Alkaline Phosphatase: 61 U/L (ref 38–126)
BUN: 12 mg/dL (ref 8–23)
CALCIUM: 7.7 mg/dL — AB (ref 8.9–10.3)
CO2: 23 mmol/L (ref 22–32)
CREATININE: 1.51 mg/dL — AB (ref 0.61–1.24)
Chloride: 105 mmol/L (ref 98–111)
GFR calc Af Amer: 51 mL/min — ABNORMAL LOW (ref >60)
GFR calc non Af Amer: 44 mL/min — ABNORMAL LOW (ref >60)
GLUCOSE: 176 mg/dL — AB (ref 70–99)
Potassium: 3.1 mmol/L — ABNORMAL LOW (ref 3.5–5.1)
Sodium: 135 mmol/L (ref 135–145)
TOTAL PROTEIN: 5.7 g/dL — AB (ref 6.5–8.1)
Total Bilirubin: 2.6 mg/dL — ABNORMAL HIGH (ref 0.3–1.2)

## 2017-12-28 LAB — CBC WITH DIFFERENTIAL/PLATELET
BASOS PCT: 0 %
Basophils Absolute: 0 10*3/uL (ref 0–0.1)
EOS ABS: 0 10*3/uL (ref 0–0.7)
EOS PCT: 0 %
HCT: 28.5 % — ABNORMAL LOW (ref 40.0–52.0)
Hemoglobin: 10.3 g/dL — ABNORMAL LOW (ref 13.0–18.0)
Lymphocytes Relative: 7 %
Lymphs Abs: 0.3 10*3/uL — ABNORMAL LOW (ref 1.0–3.6)
MCH: 36.2 pg — ABNORMAL HIGH (ref 26.0–34.0)
MCHC: 36.2 g/dL — AB (ref 32.0–36.0)
MCV: 99.9 fL (ref 80.0–100.0)
MONOS PCT: 3 %
Monocytes Absolute: 0.1 10*3/uL — ABNORMAL LOW (ref 0.2–1.0)
Neutro Abs: 4.1 10*3/uL (ref 1.4–6.5)
Neutrophils Relative %: 90 %
Platelets: 131 10*3/uL — ABNORMAL LOW (ref 150–440)
RBC: 2.85 MIL/uL — ABNORMAL LOW (ref 4.40–5.90)
RDW: 13.5 % (ref 11.5–14.5)
WBC: 4.6 10*3/uL (ref 3.8–10.6)

## 2017-12-28 LAB — LACTIC ACID, PLASMA: Lactic Acid, Venous: 1.9 mmol/L (ref 0.5–1.9)

## 2017-12-28 LAB — URINALYSIS, COMPLETE (UACMP) WITH MICROSCOPIC
BACTERIA UA: NONE SEEN
Bilirubin Urine: NEGATIVE
Glucose, UA: NEGATIVE mg/dL
KETONES UR: 5 mg/dL — AB
Nitrite: NEGATIVE
PH: 6 (ref 5.0–8.0)
Protein, ur: NEGATIVE mg/dL
RBC / HPF: >50 RBC/hpf — ABNORMAL HIGH (ref 0–5)
SPECIFIC GRAVITY, URINE: 1.011 (ref 1.005–1.030)

## 2017-12-28 LAB — PROTIME-INR
INR: 1.17
Prothrombin Time: 14.8 seconds (ref 11.4–15.2)

## 2017-12-28 MED ORDER — SODIUM CHLORIDE 0.9 % IV BOLUS
1000.0000 mL | Freq: Once | INTRAVENOUS | Status: AC
  Administered 2017-12-29: 1000 mL via INTRAVENOUS

## 2017-12-28 MED ORDER — DIPHENHYDRAMINE HCL 50 MG/ML IJ SOLN
25.0000 mg | Freq: Once | INTRAMUSCULAR | Status: AC
  Administered 2017-12-29: 25 mg via INTRAVENOUS
  Filled 2017-12-28: qty 1

## 2017-12-28 MED ORDER — METOCLOPRAMIDE HCL 5 MG/ML IJ SOLN
10.0000 mg | Freq: Once | INTRAMUSCULAR | Status: AC
  Administered 2017-12-29: 10 mg via INTRAVENOUS
  Filled 2017-12-28: qty 2

## 2017-12-28 MED ORDER — KETOROLAC TROMETHAMINE 30 MG/ML IJ SOLN
30.0000 mg | Freq: Once | INTRAMUSCULAR | Status: AC
  Administered 2017-12-29: 30 mg via INTRAVENOUS
  Filled 2017-12-28: qty 1

## 2017-12-28 NOTE — ED Triage Notes (Signed)
Pt c/o fever, n/v, intermittent confusion over the last several weeks.

## 2017-12-29 ENCOUNTER — Emergency Department: Payer: Medicare HMO

## 2017-12-29 DIAGNOSIS — N200 Calculus of kidney: Secondary | ICD-10-CM | POA: Diagnosis not present

## 2017-12-29 DIAGNOSIS — R410 Disorientation, unspecified: Secondary | ICD-10-CM | POA: Diagnosis not present

## 2017-12-29 DIAGNOSIS — R112 Nausea with vomiting, unspecified: Secondary | ICD-10-CM | POA: Diagnosis not present

## 2017-12-29 DIAGNOSIS — R509 Fever, unspecified: Secondary | ICD-10-CM | POA: Diagnosis not present

## 2017-12-29 DIAGNOSIS — N202 Calculus of kidney with calculus of ureter: Secondary | ICD-10-CM | POA: Diagnosis not present

## 2017-12-29 DIAGNOSIS — Z79899 Other long term (current) drug therapy: Secondary | ICD-10-CM | POA: Diagnosis not present

## 2017-12-29 DIAGNOSIS — Z9989 Dependence on other enabling machines and devices: Secondary | ICD-10-CM | POA: Diagnosis not present

## 2017-12-29 DIAGNOSIS — K8020 Calculus of gallbladder without cholecystitis without obstruction: Secondary | ICD-10-CM | POA: Diagnosis not present

## 2017-12-29 LAB — BLOOD CULTURE ID PANEL (REFLEXED)
Acinetobacter baumannii: NOT DETECTED
CANDIDA KRUSEI: NOT DETECTED
Candida albicans: NOT DETECTED
Candida glabrata: NOT DETECTED
Candida parapsilosis: NOT DETECTED
Candida tropicalis: NOT DETECTED
ENTEROBACTER CLOACAE COMPLEX: NOT DETECTED
ENTEROCOCCUS SPECIES: NOT DETECTED
ESCHERICHIA COLI: NOT DETECTED
Enterobacteriaceae species: NOT DETECTED
Haemophilus influenzae: NOT DETECTED
Klebsiella oxytoca: NOT DETECTED
Klebsiella pneumoniae: NOT DETECTED
LISTERIA MONOCYTOGENES: NOT DETECTED
METHICILLIN RESISTANCE: NOT DETECTED
Neisseria meningitidis: NOT DETECTED
PSEUDOMONAS AERUGINOSA: NOT DETECTED
Proteus species: NOT DETECTED
SERRATIA MARCESCENS: NOT DETECTED
STAPHYLOCOCCUS AUREUS BCID: NOT DETECTED
STREPTOCOCCUS PNEUMONIAE: NOT DETECTED
Staphylococcus species: DETECTED — AB
Streptococcus agalactiae: NOT DETECTED
Streptococcus pyogenes: NOT DETECTED
Streptococcus species: NOT DETECTED

## 2017-12-29 MED ORDER — MORPHINE SULFATE (PF) 4 MG/ML IV SOLN
INTRAVENOUS | Status: AC
  Filled 2017-12-29: qty 1

## 2017-12-29 MED ORDER — MORPHINE SULFATE (PF) 4 MG/ML IV SOLN
4.0000 mg | Freq: Once | INTRAVENOUS | Status: AC
  Administered 2017-12-29: 4 mg via INTRAVENOUS
  Filled 2017-12-29: qty 1

## 2017-12-29 MED ORDER — CIPROFLOXACIN IN D5W 400 MG/200ML IV SOLN
400.0000 mg | Freq: Once | INTRAVENOUS | Status: AC
  Administered 2017-12-29: 400 mg via INTRAVENOUS
  Filled 2017-12-29: qty 200

## 2017-12-29 MED ORDER — MORPHINE SULFATE (PF) 2 MG/ML IV SOLN
2.0000 mg | Freq: Once | INTRAVENOUS | Status: AC
  Administered 2017-12-29: 2 mg via INTRAVENOUS

## 2017-12-29 MED ORDER — SODIUM CHLORIDE 0.9 % IV SOLN
Freq: Once | INTRAVENOUS | Status: AC
  Administered 2017-12-29: 06:00:00 via INTRAVENOUS

## 2017-12-29 MED ORDER — METRONIDAZOLE IN NACL 5-0.79 MG/ML-% IV SOLN
500.0000 mg | Freq: Once | INTRAVENOUS | Status: AC
  Administered 2017-12-29: 500 mg via INTRAVENOUS
  Filled 2017-12-29: qty 100

## 2017-12-29 NOTE — ED Notes (Signed)
110pm. Lab called with blood culture result.  Result -gm pos cocci and gm varioable rod to [facility] ED.

## 2017-12-29 NOTE — ED Notes (Signed)
Pt reports pain remains same; med admin as ordered

## 2017-12-29 NOTE — ED Notes (Signed)
[facility]  CALLED  FOR  TRANSPORT  TO  [place]  [facility]  ROOM

## 2017-12-29 NOTE — ED Notes (Signed)
Pt to u/s via stretcher accomp by u/s tech 

## 2017-12-29 NOTE — ED Provider Notes (Addendum)
[facility] Emergency Department Provider Note   ____________________________________________   First MD Initiated Contact with Patient 12/28/17 2308     (approximate)  I have reviewed the triage vital signs and the nursing notes.   HISTORY  Chief Complaint Code Sepsis    HPI Jackson [name] is a 72 y.o. male who comes into the hospital today feeling unwell.  He had some shakes and some sweating and some vomiting.  His family states that he had some double vision and body aches.  His symptoms started around noon today.  He decided feeling bad and aching all over.  They did not check his temperature but he was burning up.  He did not take any medicines aside from his naproxen at 4 PM.  He is had some difficulty urinating in the past and states that he had some pain with urination and blood in his urine.  His urine was also cloudy.  He vomited 3 times but was unsure what it looked like.  The patient rates his pain about 8-9 out of 10 in intensity.  He has some flank pain headache and leg pain.  The patient states that he has been having some headaches for a while and [facility]  His family was concerned because he is also been confused so he is here today for evaluation.  Past Medical History:  Diagnosis Date  . Agent orange exposure   . Baker's cyst of knee, left   . Cancer ([place])    testicular  . Hearing loss d/t noise     Patient Active Problem List   Diagnosis Date Noted  . Bilateral headaches 06/29/2017  . Mastoiditis of both sides 06/29/2017  . Insomnia 02/03/2013  . Left shoulder pain 02/03/2013  . Anemia 01/22/2012  . Chronic back pain 01/16/2012  . Episode of transient neurologic symptoms 01/16/2012    Past Surgical History:  Procedure Laterality Date  . APPENDECTOMY  1996  . BACK SURGERY  1996   Fusion L4-L5; Bulging disks L2-L4. L5-S1  . EYE SURGERY Bilateral 1992   Radical Kernotomy     Prior to Admission medications   Medication Sig Start Date End Date Taking? Authorizing Provider  naproxen (NAPROSYN) 500 MG tablet Take 500 mg by mouth 2 (two) times daily as needed for headache.   Yes [provider]  tamsulosin (FLOMAX) 0.4 MG CAPS capsule Take 0.4 mg by mouth every evening.   Yes [provider]    Allergies Penicillins  Family History  Problem Relation Age of Onset  . Cerebral aneurysm Mother   . Heart attack Father   . Breast cancer Sister   . Breast cancer Daughter     Social History Social History   Tobacco Use  . Smoking status: Never Smoker  . Smokeless tobacco: Never Used  Substance Use Topics  . Alcohol use: No    Comment: former  . Drug use: No    Review of Systems  Constitutional: chills Eyes: Double vision ENT: No sore throat. Cardiovascular: Denies chest pain. Respiratory: Denies shortness of breath. Gastrointestinal: No abdominal pain.  No nausea, no vomiting.  No diarrhea.  No constipation. Genitourinary: dysuria, and hematuria Musculoskeletal: Body aches Skin: Negative for rash. Neurological: Headache and confusion   ____________________________________________   PHYSICAL EXAM:  VITAL SIGNS: ED Triage Vitals  Enc Vitals Group     BP 12/28/17 2229 (!) 141/54  Pulse Rate 12/28/17 2229 88     Resp 12/28/17 2229 18     Temp 12/28/17 2229 99 F (37.2 C)     Temp Source 12/29/17 0026 Oral     SpO2 12/28/17 2229 96 %     Weight 12/28/17 2226 180 lb (81.6 kg)     Height 12/28/17 2226 6' (1.829 m)     Head Circumference --      Peak Flow --      Pain Score 12/28/17 2225 9     Pain Loc --      Pain Edu? --      Excl. in [place]? --     Constitutional: Alert and oriented. Ill appearing and in moderate distress. Eyes: Conjunctivae are normal. PERRL. EOMI. Head: Atraumatic. Nose: No congestion/rhinnorhea. Mouth/Throat: Mucous membranes are moist.  Oropharynx non-erythematous. Cardiovascular: Normal  rate, regular rhythm. Grossly normal heart sounds.  Good peripheral circulation. Respiratory: Normal respiratory effort.  No retractions. Lungs CTAB. Gastrointestinal: Soft and nontender. No distention.  Positive bowel sounds Musculoskeletal: No lower extremity tenderness nor edema.   Neurologic:  Normal speech and language.  Cranial nerves II through XII are grossly intact with no focal motor neuro deficit Skin:  Skin is warm, dry and intact.  Psychiatric: Mood and affect are normal.   ____________________________________________   LABS (all labs ordered are listed, but only abnormal results are displayed)  Labs Reviewed  COMPREHENSIVE METABOLIC PANEL - Abnormal; Notable for the following components:      Result Value   Potassium 3.1 (*)    Glucose, Bld 176 (*)    Creatinine, Ser 1.51 (*)    Calcium 7.7 (*)    Total Protein 5.7 (*)    Albumin 3.2 (*)    Total Bilirubin 2.6 (*)    GFR calc non Af Amer 44 (*)    GFR calc Af Amer 51 (*)    All other components within normal limits  CBC WITH DIFFERENTIAL/PLATELET - Abnormal; Notable for the following components:   RBC 2.85 (*)    Hemoglobin 10.3 (*)    HCT 28.5 (*)    MCH 36.2 (*)    MCHC 36.2 (*)    Platelets 131 (*)    Lymphs Abs 0.3 (*)    Monocytes Absolute 0.1 (*)    All other components within normal limits  URINALYSIS, COMPLETE (UACMP) WITH MICROSCOPIC - Abnormal; Notable for the following components:   Color, Urine YELLOW (*)    APPearance CLEAR (*)    Hgb urine dipstick MODERATE (*)    Ketones, ur 5 (*)    Leukocytes, UA TRACE (*)    RBC / HPF >50 (*)    All other components within normal limits  CULTURE, BLOOD (ROUTINE X 2)  CULTURE, BLOOD (ROUTINE X 2)  URINE CULTURE  LACTIC ACID, PLASMA  PROTIME-INR  LACTIC ACID, PLASMA   ____________________________________________  EKG  none ____________________________________________  RADIOLOGY  ED MD interpretation:  CXR: No active cardiopulmonary  disease  CT head: No acute intracranial abnormality, stable normal CT of the head given differences in technique.  CT renal stone study: 62mm distal right ureteral stone with obstructive changes, bilateral renal calculi are noted left greater than right as described, gallstone within the neck of the gallbladder is well distended although no definitive wall thickening or pericholecystic fluid is noted. HIDA scan may be helpful for further evaluation of cystic duct obstruction.  US abdomen RUQ: Cholelithiasis with large stone in the gallbladder neck, no definite  additional changes to suggest cholecystitis, incidental note of right renal hydronephrosis   Official radiology report(s): Dg Chest 2 View  Result Date: 12/28/2017 CLINICAL DATA:  Fevers EXAM: CHEST - 2 VIEW COMPARISON:  10/09/2016 FINDINGS: The heart size and mediastinal contours are within normal limits. Both lungs are clear. The visualized skeletal structures show postsurgical changes in the left shoulder stable from the prior exam. IMPRESSION: No active cardiopulmonary disease. Electronically Signed   By: Inez Catalina M.D.   On: 12/28/2017 23:24   Ct Head Wo Contrast  Result Date: 12/28/2017 CLINICAL DATA:  72 y/o M; fever, nausea, vomiting, intermittent confusion over the past several weeks. EXAM: CT HEAD WITHOUT CONTRAST TECHNIQUE: Contiguous axial images were obtained from the base of the skull through the vertex without intravenous contrast. COMPARISON:  07/25/2009 MRI head.  07/06/2017 MRA head. FINDINGS: Brain: No evidence of acute infarction, hemorrhage, hydrocephalus, extra-axial collection or mass lesion/mass effect. Vascular: No hyperdense vessel or unexpected calcification. Skull: Normal. Negative for fracture or focal lesion. Sinuses/Orbits: No acute finding. 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 IMPRESSION: No acute intracranial abnormality. Stable normal CT of the head given differences in technique.  Electronically Signed   By: Kristine Garbe M.D.   On: 12/28/2017 23:19   Ct Renal Stone Study  Result Date: 12/29/2017 CLINICAL DATA:  Right-sided flank pain for 1 day EXAM: CT ABDOMEN AND PELVIS WITHOUT CONTRAST TECHNIQUE: Multidetector CT imaging of the abdomen and pelvis was performed following the standard protocol without IV contrast. COMPARISON:  None. FINDINGS: Lower chest: No acute abnormality. Hepatobiliary: Liver is well visualized and within normal limits. The gallbladder is well distended and demonstrates a gallstone lodged within neck. HIDA scan may be helpful for further evaluation as to the possibility of cystic duct obstruction. No wall thickening or pericholecystic inflammatory changes are noted. Pancreas: Unremarkable. No pancreatic ductal dilatation or surrounding inflammatory changes. Spleen: Normal in size without focal abnormality. Adrenals/Urinary Tract: The adrenal glands are within normal limits. The left kidney demonstrates nonobstructing stone in the lower pole measuring almost 12 mm in dimension. Some fullness of the left collecting system and ureter is noted although no true obstructive changes are seen. On the right multiple renal calculi are identified predominately in the lower pole. Hydronephrosis and hydroureter is noted which extends to the mid to distal right ureter. A 7 mm stone is noted in the distal ureter best seen on image number 67 of series 2 and image number 74 of series 5. The more distal right ureter is within normal limits. The bladder is well distended. Stomach/Bowel: The appendix has been surgically removed. No obstructive or inflammatory changes are noted. Vascular/Lymphatic: Vascular calcifications are noted without aneurysmal dilatation. No significant lymphadenopathy is noted. Reproductive: Prostate is unremarkable. Other: No abdominal wall hernia or abnormality. No abdominopelvic ascites. Musculoskeletal: Degenerative changes of the lumbar spine are  noted. No acute bony abnormality is seen. IMPRESSION: 7 mm distal right ureteral stone with obstructive changes. Bilateral renal calculi are noted left greater than right as described. Gallstone within the neck of the gallbladder. The gallbladder is well distended although no definitive wall thickening or pericholecystic fluid is noted. HIDA scan may be helpful for further evaluation of possible cystic duct obstruction. Electronically Signed   By: Inez Catalina M.D.   On: 12/29/2017 00:19   US Abdomen Limited Ruq  Result Date: 12/29/2017 CLINICAL DATA:  Right flank pain for 2 days.  Cholelithiasis at CT. EXAM: ULTRASOUND ABDOMEN LIMITED RIGHT UPPER QUADRANT  COMPARISON:  CT abdomen and pelvis 12/29/2017 FINDINGS: Gallbladder: Large stone in the gallbladder neck measuring 1.9 cm in diameter. Gallbladder sludge. No gallbladder wall thickening or edema. Murphy's sign is negative. Murphy's sign is limited by patient medication. Common bile duct: Diameter: 6.2 mm, normal Liver: No focal lesion identified. Within normal limits in parenchymal echogenicity. Portal vein is patent on color Doppler imaging with normal direction of blood flow towards the liver. Mild hydronephrosis is demonstrated in the right kidney, as seen on previous CT. IMPRESSION: 1. Cholelithiasis with large stone in the gallbladder neck. No definite additional changes to suggest cholecystitis. 2. Incidental note of right renal hydronephrosis. Electronically Signed   By: Lucienne Capers M.D.   On: 12/29/2017 03:54    ____________________________________________   PROCEDURES  Procedure(s) performed: None  Procedures  Critical Care performed: No  ____________________________________________   INITIAL IMPRESSION / ASSESSMENT AND PLAN / ED COURSE  As part of my medical decision making, I reviewed the following data within the electronic MEDICAL RECORD NUMBER Notes from prior ED visits and [place] Controlled Substance Database   This is a  72 year old male who comes into the hospital today feeling unwell.  He has had some shakes and some blurred vision with confusion.  The patient is also had some urinary symptoms.  I am concerned that the patient may have a urinary tract infection causing his symptoms.  We did check some blood work to include a CBC, CMP, lactic acid, urinalysis.  We also did a CT scan of the patient's head and renal stone study.  The patient CT head is unremarkable but he does have a 7 mm distal right ureteral stone with obstructive changes.  I did give the patient a liter of normal saline, Reglan, Toradol and Benadryl.  I will also send him for an ultrasound as he does have a gallstone within the neck of the gallbladder.  He will be reassessed.  The patient's ultrasound does not show an inflamed gallbladder or any ductal dilatation as well as pericholecystic fluid.  I did order some ciprofloxacin and Flagyl for the patient.  As the patient was febrile and having confusion I feel that he needs to be admitted for some IV antibiotics and further monitoring.  He does have some dehydration with an elevated creatinine.  I will contact the [facility] to transfer the patient.   The patient has been accepted to the [place] by Dr. Haynes Kerns.      ____________________________________________   FINAL CLINICAL IMPRESSION(S) / ED DIAGNOSES  Final diagnoses:  Abdominal pain  Kidney stone  Calculus of gallbladder without cholecystitis without obstruction  Fever, unspecified fever cause  Confusion     ED Discharge Orders    None       Note:  This document was prepared using Dragon voice recognition software and may include unintentional dictation errors.    Loney Hering, MD 12/29/17 7517    Loney Hering, MD 12/29/17 [phone]

## 2017-12-29 NOTE — ED Notes (Signed)
Pt uprite on stretcher in darkened exam room with eyes closed, no distress noted; reports generalized HA 9/10; daughter at bedside; meds ordered to be admin for such

## 2017-12-29 NOTE — ED Notes (Signed)
12/29/2017  415pm  [facility] [place] to give [facility] biofire blood culture.  Unable to reach caregivers.  They could not give me a fax to send it.  Result faxed to [facility]  [facility]OD and he will get it to caregivers.

## 2017-12-29 NOTE — ED Notes (Signed)
Copy  Of  Xray  Sent  With  acems

## 2017-12-29 NOTE — ED Notes (Signed)
[facility]ax Request to [facility]/ Waiting on response

## 2017-12-30 LAB — URINE CULTURE: Culture: NO GROWTH

## 2018-01-01 LAB — CULTURE, BLOOD (ROUTINE X 2): Special Requests: ADEQUATE

## 2018-02-12 IMAGING — CR DG CHEST 2V
1 series · 2 of 2 positions shown · non-contrast
Comparison: Left shoulder 07/12/2007.

CLINICAL DATA: Shortness of breath.

EXAM:
CHEST  2 VIEW

[Series 1: dg chest 2 view · 0.14mm/px · 2 of 2 slices shown]
[im 1/2]
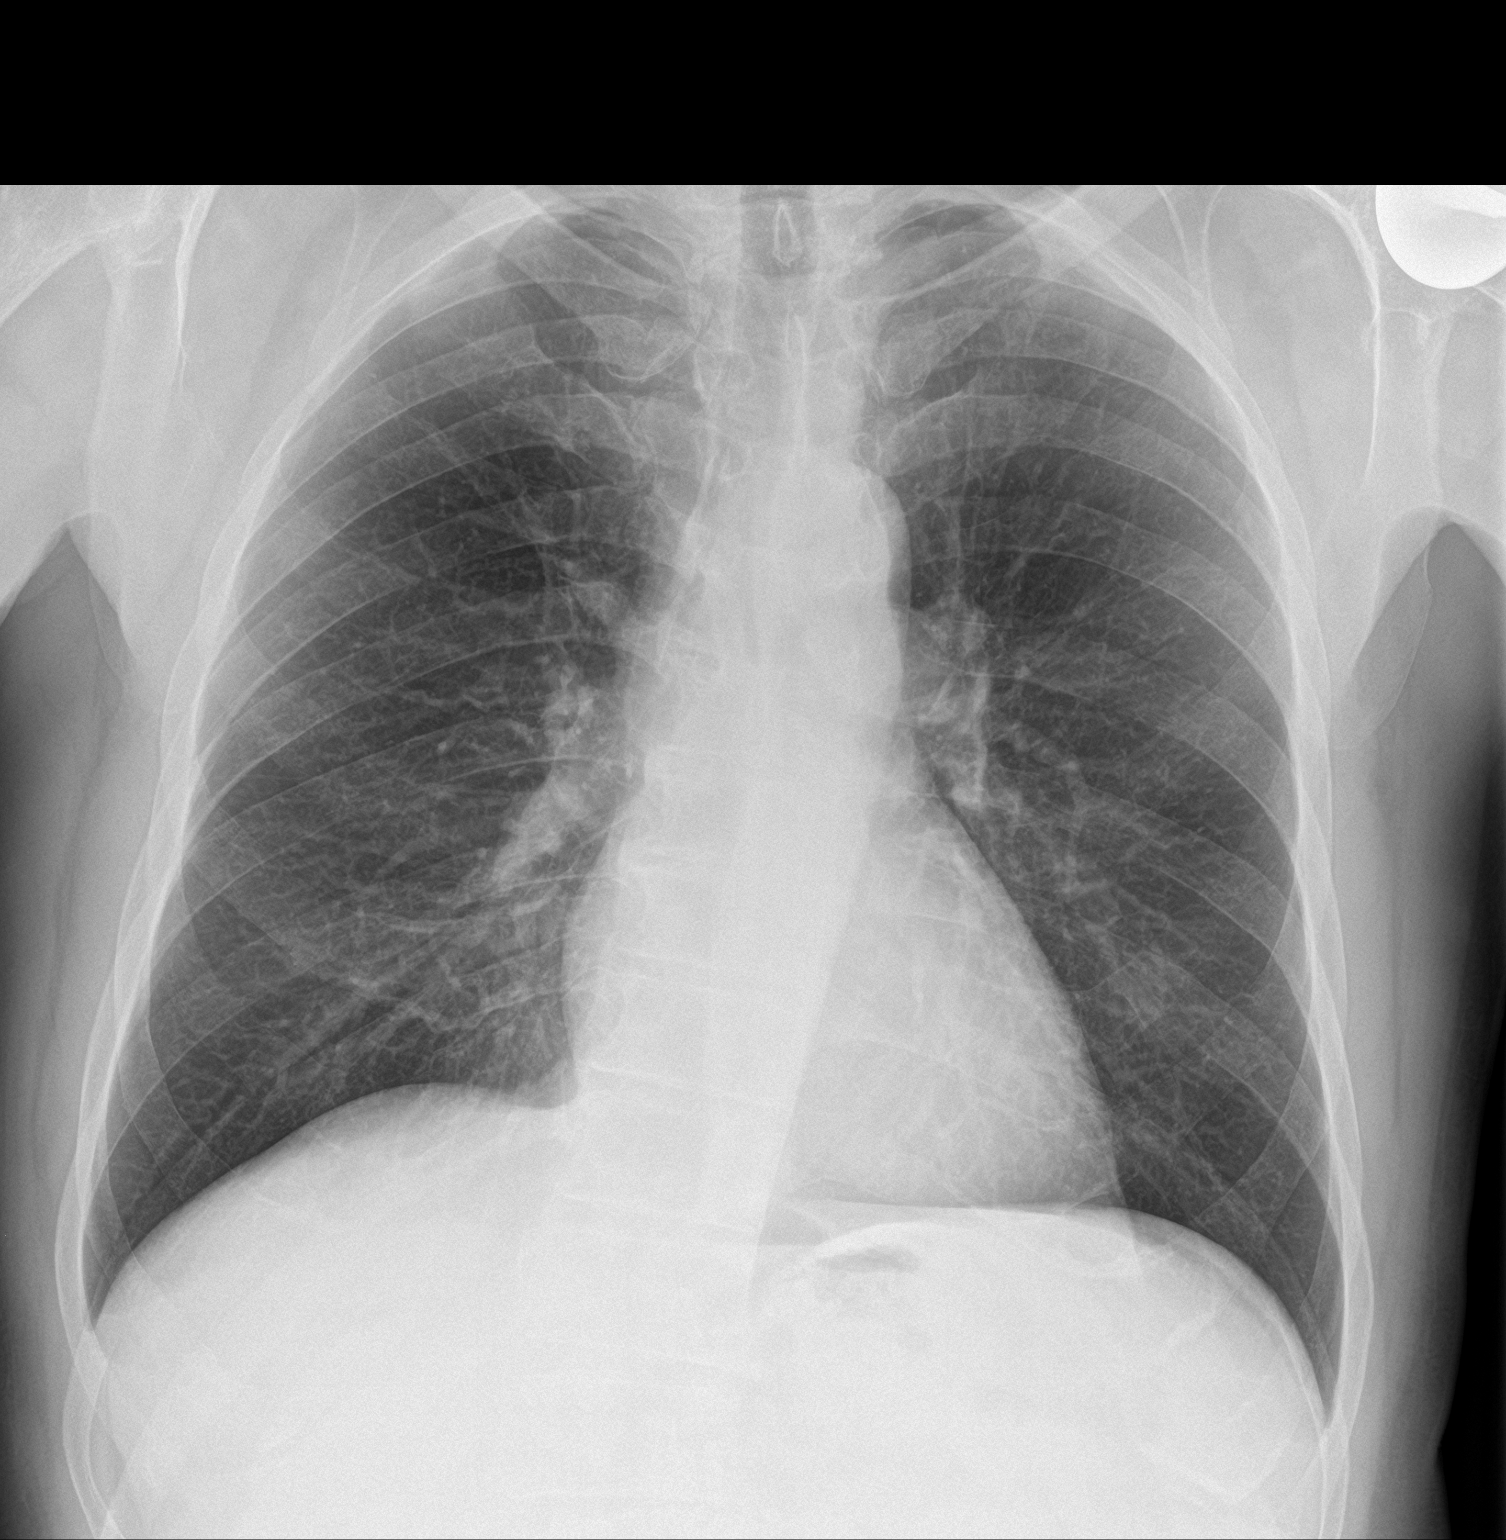
[im 2/2]
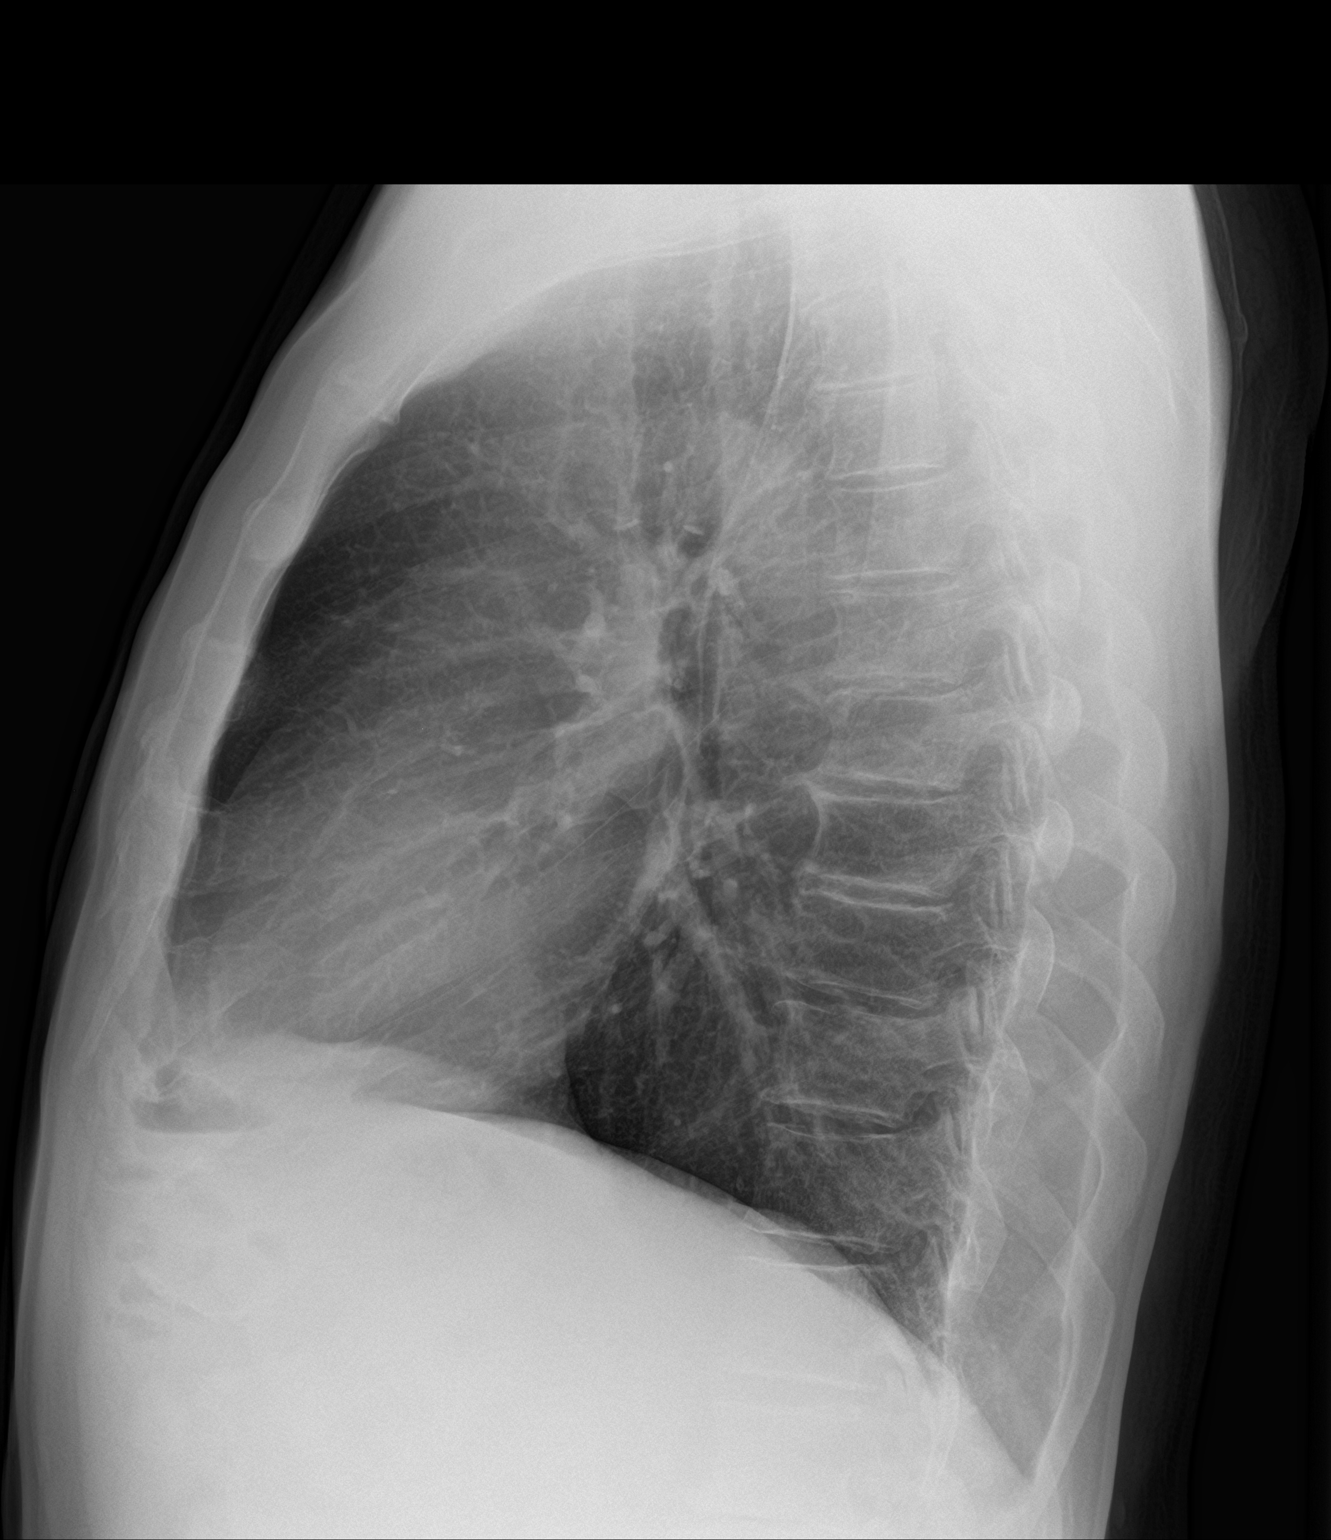

[2 of 2 positions shown; findings below may reference images not displayed]

FINDINGS: Mediastinum hilar structures normal. Lungs are clear. Heart size
normal. Degenerative changes right shoulder and thoracic spine. Left
shoulder replacement again noted.
IMPRESSION: No acute cardiopulmonary disease.

## 2018-02-16 DIAGNOSIS — B999 Unspecified infectious disease: Secondary | ICD-10-CM

## 2018-02-16 DIAGNOSIS — A419 Sepsis, unspecified organism: Secondary | ICD-10-CM

## 2018-02-16 HISTORY — DX: Unspecified infectious disease: B99.9

## 2018-02-16 HISTORY — DX: Sepsis, unspecified organism: A41.9

## 2018-04-16 HISTORY — PX: KIDNEY STONE SURGERY: SHX686

## 2018-05-18 HISTORY — PX: KIDNEY STONE SURGERY: SHX686

## 2018-07-12 ENCOUNTER — Telehealth: Payer: Self-pay | Admitting: Nurse Practitioner

## 2018-07-12 NOTE — Telephone Encounter (Signed)
Called to schedule Medicare Annual Wellness Visit with the Nurse Health Advisor.  No answer.  Left message to call Lattie Haw at [phone].  If patient returns call, please note: their last AWV was on 10/14/2016 please schedule AWV with [facility] any date AFTER 10/14/2017.  Thank you! For any questions please contact: Janace Hoard at [phone] or Skype [email or web address]

## 2018-07-13 ENCOUNTER — Ambulatory Visit (INDEPENDENT_AMBULATORY_CARE_PROVIDER_SITE_OTHER): Payer: Medicare HMO

## 2018-07-13 VITALS — BP 138/78 | HR 60 | Temp 98.9°F | Resp 16 | Ht 69.0 in | Wt 184.0 lb

## 2018-07-13 DIAGNOSIS — Z0000 Encounter for general adult medical examination without abnormal findings: Secondary | ICD-10-CM | POA: Diagnosis not present

## 2018-07-13 NOTE — Patient Instructions (Addendum)
Jackson [name] , Thank you for taking time to come for your Medicare Wellness Visit. I appreciate your ongoing commitment to your health goals. Please review the following plan we discussed and let me know if I can assist you in the future.   Screening recommendations/referrals: Colonoscopy: completed 05/2011 Recommended yearly ophthalmology/optometry visit for glaucoma screening and checkup Recommended yearly dental visit for hygiene and checkup  Vaccinations: Influenza vaccine: up to date  Pneumococcal vaccine: completed series Tdap vaccine: up to date Shingles vaccine: shingrix eligible, check with your insurance company for coverage     Advanced directives: Advance directive discussed with you today. Even though you declined this today please call our office should you change your mind and we can give you the proper paperwork for you to fill out.  Conditions/risks identified: please call if your cold symptoms worsen.   Next appointment: Follow up with Jackson [name] for your annual physical. Follow up in one year for your medicare wellness exam.   Preventive Care 65 Years and Older, Male Preventive care refers to lifestyle choices and visits with your health care provider that can promote health and wellness. What does preventive care include?  A yearly physical exam. This is also called an annual well check.  Dental exams once or twice a year.  Routine eye exams. Ask your health care provider how often you should have your eyes checked.  Personal lifestyle choices, including:  Daily care of your teeth and gums.  Regular physical activity.  Eating a healthy diet.  Avoiding tobacco and drug use.  Limiting alcohol use.  Practicing safe sex.  Taking low doses of aspirin every day.  Taking vitamin and mineral supplements as recommended by your health care provider. What happens during an annual well check? The services and screenings done by your health care provider  during your annual well check will depend on your age, overall health, lifestyle risk factors, and family history of disease. Counseling  Your health care provider may ask you questions about your:  Alcohol use.  Tobacco use.  Drug use.  Emotional well-being.  Home and relationship well-being.  Sexual activity.  Eating habits.  History of falls.  Memory and ability to understand (cognition).  Work and work Statistician. Screening  You may have the following tests or measurements:  Height, weight, and BMI.  Blood pressure.  Lipid and cholesterol levels. These may be checked every 5 years, or more frequently if you are over 70 years old.  Skin check.  Lung cancer screening. You may have this screening every year starting at age 69 if you have a 30-pack-year history of smoking and currently smoke or have quit within the past 15 years.  Fecal occult blood test (FOBT) of the stool. You may have this test every year starting at age 7.  Flexible sigmoidoscopy or colonoscopy. You may have a sigmoidoscopy every 5 years or a colonoscopy every 10 years starting at age 20.  Prostate cancer screening. Recommendations will vary depending on your family history and other risks.  Hepatitis C blood test.  Hepatitis B blood test.  Sexually transmitted disease (STD) testing.  Diabetes screening. This is done by checking your blood sugar (glucose) after you have not eaten for a while (fasting). You may have this done every 1-3 years.  Abdominal aortic aneurysm (AAA) screening. You may need this if you are a current or former smoker.  Osteoporosis. You may be screened starting at age 5 if you are at high risk. Talk  with your health care provider about your test results, treatment options, and if necessary, the need for more tests. Vaccines  Your health care provider may recommend certain vaccines, such as:  Influenza vaccine. This is recommended every year.  Tetanus,  diphtheria, and acellular pertussis (Tdap, Td) vaccine. You may need a Td booster every 10 years.  Zoster vaccine. You may need this after age 37.  Pneumococcal 13-valent conjugate (PCV13) vaccine. One dose is recommended after age 10.  Pneumococcal polysaccharide (PPSV23) vaccine. One dose is recommended after age 3. Talk to your health care provider about which screenings and vaccines you need and how often you need them. This information is not intended to replace advice given to you by your health care provider. Make sure you discuss any questions you have with your health care provider. Document Released: 06/01/2015 Document Revised: 01/23/2016 Document Reviewed: 03/06/2015 Elsevier Interactive Patient Education  2017 [place] Prevention in the Home Falls can cause injuries. They can happen to people of all ages. There are many things you can do to make your home safe and to help prevent falls. What can I do on the outside of my home?  Regularly fix the edges of walkways and driveways and fix any cracks.  Remove anything that might make you trip as you walk through a door, such as a raised step or threshold.  Trim any bushes or trees on the path to your home.  Use bright outdoor lighting.  Clear any walking paths of anything that might make someone trip, such as rocks or tools.  Regularly check to see if handrails are loose or broken. Make sure that both sides of any steps have handrails.  Any raised decks and porches should have guardrails on the edges.  Have any leaves, snow, or ice cleared regularly.  Use sand or salt on walking paths during winter.  Clean up any spills in your garage right away. This includes oil or grease spills. What can I do in the bathroom?  Use night lights.  Install grab bars by the toilet and in the tub and shower. Do not use towel bars as grab bars.  Use non-skid mats or decals in the tub or shower.  If you need to sit down in  the shower, use a plastic, non-slip stool.  Keep the floor dry. Clean up any water that spills on the floor as soon as it happens.  Remove soap buildup in the tub or shower regularly.  Attach bath mats securely with double-sided non-slip rug tape.  Do not have throw rugs and other things on the floor that can make you trip. What can I do in the bedroom?  Use night lights.  Make sure that you have a light by your bed that is easy to reach.  Do not use any sheets or blankets that are too big for your bed. They should not hang down onto the floor.  Have a firm chair that has side arms. You can use this for support while you get dressed.  Do not have throw rugs and other things on the floor that can make you trip. What can I do in the kitchen?  Clean up any spills right away.  Avoid walking on wet floors.  Keep items that you use a lot in easy-to-reach places.  If you need to reach something above you, use a strong step stool that has a grab bar.  Keep electrical cords out of the way.  Do  not use floor polish or wax that makes floors slippery. If you must use wax, use non-skid floor wax.  Do not have throw rugs and other things on the floor that can make you trip. What can I do with my stairs?  Do not leave any items on the stairs.  Make sure that there are handrails on both sides of the stairs and use them. Fix handrails that are broken or loose. Make sure that handrails are as long as the stairways.  Check any carpeting to make sure that it is firmly attached to the stairs. Fix any carpet that is loose or worn.  Avoid having throw rugs at the top or bottom of the stairs. If you do have throw rugs, attach them to the floor with carpet tape.  Make sure that you have a light switch at the top of the stairs and the bottom of the stairs. If you do not have them, ask someone to add them for you. What else can I do to help prevent falls?  Wear shoes that:  Do not have high  heels.  Have rubber bottoms.  Are comfortable and fit you well.  Are closed at the toe. Do not wear sandals.  If you use a stepladder:  Make sure that it is fully opened. Do not climb a closed stepladder.  Make sure that both sides of the stepladder are locked into place.  Ask someone to hold it for you, if possible.  Clearly mark and make sure that you can see:  Any grab bars or handrails.  First and last steps.  Where the edge of each step is.  Use tools that help you move around (mobility aids) if they are needed. These include:  Canes.  Walkers.  Scooters.  Crutches.  Turn on the lights when you go into a dark area. Replace any light bulbs as soon as they burn out.  Set up your furniture so you have a clear path. Avoid moving your furniture around.  If any of your floors are uneven, fix them.  If there are any pets around you, be aware of where they are.  Review your medicines with your doctor. Some medicines can make you feel dizzy. This can increase your chance of falling. Ask your doctor what other things that you can do to help prevent falls. This information is not intended to replace advice given to you by your health care provider. Make sure you discuss any questions you have with your health care provider. Document Released: 03/01/2009 Document Revised: 10/11/2015 Document Reviewed: 06/09/2014 Elsevier Interactive Patient Education  2017 [employer].

## 2018-07-13 NOTE — Progress Notes (Signed)
Subjective:   Jackson [name] is a 73 y.o. male who presents for Medicare Annual/Subsequent preventive examination.  Review of Systems:   Cardiac Risk Factors include: advanced age (>31men, >47 women)     Objective:    Vitals: BP 138/78 (BP Location: Left Arm, Cuff Size: Normal)   Pulse 60   Temp 98.9 F (37.2 C) (Oral)   Resp 16   Ht 5\' 9"  (1.753 m)   Wt 184 lb (83.5 kg)   BMI 27.17 kg/m   Body mass index is 27.17 kg/m.  Advanced Directives 07/13/2018 10/14/2016  Does Patient Have a Medical Advance Directive? Yes Yes  Type of Advance Directive Living will;Healthcare Power of Attorney Living will  Copy of [place] in Chart? No - copy requested -    Tobacco Social History   Tobacco Use  Smoking Status Never Smoker  Smokeless Tobacco Never Used     Counseling given: Not Answered   Clinical Intake:  Pre-visit preparation completed: Yes  Pain : 0-10 Pain Score: 7  Pain Type: Acute pain(all over ) Pain Descriptors / Indicators: Aching Pain Onset: More than a month ago Pain Frequency: Constant     Nutritional Status: BMI 25 -29 Overweight Nutritional Risks: None Diabetes: No  How often do you need to have someone help you when you read instructions, pamphlets, or other written materials from your doctor or pharmacy?: 1 - Never What is the last grade level you completed in school?: associates   Interpreter Needed?: No  Information entered by :: Hava Massingale,LPN   Past Medical History:  Diagnosis Date  . Agent orange exposure   . Baker's cyst of knee, left   . Blood [facility]) [facility]   . Cancer ([place])    testicular  . Hearing loss d/t noise   . Kidney stones    Past Surgical History:  Procedure Laterality Date  . APPENDECTOMY  1996  . BACK SURGERY  1996   Fusion L4-L5; Bulging disks L2-L4. L5-S1  . EYE SURGERY Bilateral 1992   Radical Kernotomy  . KIDNEY STONE SURGERY Left 05/18/2018   [facility]     . KIDNEY STONE SURGERY Right 04/16/2018   [facility]    Family History  Problem Relation Age of Onset  . Cerebral aneurysm Mother   . Heart attack Father   . Breast cancer Sister   . Breast cancer Daughter    Social History   Socioeconomic History  . Marital status: Divorced    Spouse name: Not on file  . Number of children: Not on file  . Years of education: Not on file  . Highest education level: Associate degree: academic program  Occupational History  . Occupation: retired  Scientific laboratory technician  . Financial resource strain: Not hard at all  . Food insecurity:    Worry: Never true    Inability: Never true  . Transportation needs:    Medical: No    Non-medical: No  Tobacco Use  . Smoking status: Never Smoker  . Smokeless tobacco: Never Used  Substance and Sexual Activity  . Alcohol use: No    Comment: former  . Drug use: No  . Sexual activity: Not Currently  Lifestyle  . Physical activity:    Days per week: 0 days    Minutes per session: 0 min  . Stress: Not at all  Relationships  . Social connections:    Talks on phone: More than  three times a week    Gets together: More than three times a week    Attends religious service: Never    Active member of club or organization: Yes    Attends meetings of clubs or organizations: More than 4 times per year    Relationship status: Divorced  Other Topics Concern  . Not on file  Social History Narrative   [facility] veteran groups     Outpatient Encounter Medications as of 07/13/2018  Medication Sig  . [DISCONTINUED] naproxen (NAPROSYN) 500 MG tablet Take 500 mg by mouth 2 (two) times daily as needed for headache.  . [DISCONTINUED] tamsulosin (FLOMAX) 0.4 MG CAPS capsule Take 0.4 mg by mouth every evening.   Facility-Administered Encounter Medications as of 07/13/2018  Medication  . albuterol (PROVENTIL) (2.5 MG/3ML) 0.083% nebulizer solution 2.5 mg    Activities of Daily Living In your present state of health,  do you have any difficulty performing the following activities: 07/13/2018  Hearing? N  Comment has hearing aids, doesnt wear them  Vision? N  Difficulty concentrating or making decisions? N  Walking or climbing stairs? Y  Comment knee pain   Dressing or bathing? N  Doing errands, shopping? N  Preparing Food and eating ? N  Using the Toilet? N  In the past six months, have you accidently leaked urine? N  Do you have problems with loss of bowel control? N  Managing your Medications? N  Managing your Finances? N  Housekeeping or managing your Housekeeping? N  Some recent data might be hidden    Patient Care Team: Mikey College, NP as PCP - General (Nurse Practitioner) [place] as Referring Physician (General Practice)   Assessment:   This is a routine wellness examination for [name].  Exercise Activities and Dietary recommendations Current Exercise Habits: The patient does not participate in regular exercise at present, Exercise limited by: None identified  Goals    . Prevent Falls     Fall prevention discussed       Fall Risk Fall Risk  07/13/2018 10/14/2016 08/28/2016  Falls in the past year? 1 Yes No  Number falls in past yr: 1 2 or more -  Injury with Fall? 0 - -  Risk Factor Category  - High Fall Risk -  Follow up - Falls prevention discussed -   FALL RISK PREVENTION PERTAINING TO THE HOME:  Any stairs in or around the home? Yes  If so, are there any without handrails? Yes, doesn't use them   Home free of loose throw rugs in walkways, pet beds, electrical cords, etc? Yes  Adequate lighting in your home to reduce risk of falls? Yes   ASSISTIVE DEVICES UTILIZED TO PREVENT FALLS:  Life alert? No  Use of a cane, walker or w/c? No  Grab bars in the bathroom? No  Shower chair or bench in shower? No  Elevated toilet seat or a handicapped toilet? No   DME ORDERS:  DME order needed?  No   TIMED UP AND GO:  Was the test performed? Yes .  Length  of time to ambulate 10 feet: 10 sec.   GAIT:  Appearance of gait: Gait stead-fast without the use of an assistive device.  Education: Fall risk prevention has been discussed.  Intervention(s) required? No    Depression Screen PHQ 2/9 Scores 07/13/2018 10/14/2016 08/28/2016  PHQ - 2 Score 0 0 0  PHQ- 9 Score - 0 -    Cognitive Function  6CIT Screen 07/13/2018 10/14/2016  What Year? 0 points 0 points  What month? 0 points 0 points  What time? 0 points 0 points  Count back from 20 0 points 0 points  Months in reverse 0 points 2 points  Repeat phrase 0 points 0 points  Total Score 0 2    Immunization History  Administered Date(s) Administered  . Influenza, High Dose Seasonal PF 02/16/2018  . Influenza-Unspecified 03/02/2015, 03/19/2016, 04/22/2016  . Pneumococcal Conjugate-13 01/31/2015  . Pneumococcal Polysaccharide-23 06/25/2011  . Td 01/31/2015  . Tdap 01/31/2015    Qualifies for Shingles Vaccine? Yes  Zostavax completed n/a. Due for Shingrix. Education has been provided regarding the importance of this vaccine. Pt has been advised to call insurance company to determine out of pocket expense. Advised may also receive vaccine at local pharmacy or Health Dept. Verbalized acceptance and understanding.  Tdap: up to date   Flu Vaccine: up to date   Pneumococcal Vaccine: up to date .  Screening Tests Health Maintenance  Topic Date Due  . COLONOSCOPY  05/19/2021  . TETANUS/TDAP  01/30/2025  . INFLUENZA VACCINE  Completed  . Hepatitis C Screening  Completed  . PNA vac Low Risk Adult  Completed   Cancer Screenings:  Colorectal Screening: Completed 05/20/2011. Repeat every 10 years   Lung Cancer Screening: (Low Dose CT Chest recommended if Age 67-80 years, 30 pack-year currently smoking OR have quit w/in 15years.) does not qualify.    Additional Screening:  Hepatitis C Screening: does qualify; Completed 05/2016- patient reported  Vision Screening: Recommended annual  ophthalmology exams for early detection of glaucoma and other disorders of the eye. Is the patient up to date with their annual eye exam?  No  Who is the provider or what is the name of the office in which the pt attends annual eye exams? [place] and [place] when needed   Dental Screening: Recommended annual dental exams for proper oral hygiene  Community Resource Referral:  CRR required this visit?  No     Plan:    I have personally reviewed and addressed the Medicare Annual Wellness questionnaire and have noted the following in the patient's chart:  A. Medical and social history B. Use of alcohol, tobacco or illicit drugs  C. Current medications and supplements D. Functional ability and status E.  Nutritional status F.  Physical activity G. Advance directives H. List of other physicians I.  Hospitalizations, surgeries, and ER visits in previous 12 months J.  [place] such as hearing and vision if needed, cognitive and depression L. Referrals and appointments   In addition, I have reviewed and discussed with patient certain preventive protocols, quality metrics, and best practice recommendations. A written personalized care plan for preventive services as well as general preventive health recommendations were provided to patient.   Signed,  Tyler Aas, LPN Nurse Health Advisor   Nurse Notes: Patient complains of sore throat,dry cough and headache that is worse than normal at appt today. States it started last night, he is not currently taking anything for it but does state the [place] is currently treating him for blood infection. Started in August of 2019- records from [facility] in chart where it began- he was transported to [place] and has been treated by [place] since then. States he is not currently on any abx or any medications, he is going to [place] tomorrow for blood work.  He is afebrile but because he is being treated for a blood infection I  informed [name] of symptoms  today. Since patient is afebrile and symptoms have just started she advised patient to call within a day or 2 if symptoms persist or worsen. Patient also aware to check his temperatures at home.   Patient also informed to schedule his annual physical with [name].    Also requested patient to bring in copies of medical records from [place].

## 2018-07-28 ENCOUNTER — Ambulatory Visit (INDEPENDENT_AMBULATORY_CARE_PROVIDER_SITE_OTHER): Payer: Medicare HMO | Admitting: Nurse Practitioner

## 2018-07-28 ENCOUNTER — Other Ambulatory Visit: Payer: Self-pay

## 2018-07-28 ENCOUNTER — Encounter: Payer: Self-pay | Admitting: Nurse Practitioner

## 2018-07-28 VITALS — BP 126/52 | HR 72 | Temp 98.6°F | Ht 69.0 in | Wt 185.6 lb

## 2018-07-28 DIAGNOSIS — Z0000 Encounter for general adult medical examination without abnormal findings: Secondary | ICD-10-CM

## 2018-07-28 DIAGNOSIS — Z136 Encounter for screening for cardiovascular disorders: Secondary | ICD-10-CM | POA: Diagnosis not present

## 2018-07-28 DIAGNOSIS — G629 Polyneuropathy, unspecified: Secondary | ICD-10-CM | POA: Insufficient documentation

## 2018-07-28 DIAGNOSIS — Z77098 Contact with and (suspected) exposure to other hazardous, chiefly nonmedicinal, chemicals: Secondary | ICD-10-CM | POA: Insufficient documentation

## 2018-07-28 DIAGNOSIS — Z13220 Encounter for screening for lipoid disorders: Secondary | ICD-10-CM | POA: Diagnosis not present

## 2018-07-28 DIAGNOSIS — Z125 Encounter for screening for malignant neoplasm of prostate: Secondary | ICD-10-CM | POA: Diagnosis not present

## 2018-07-28 NOTE — Patient Instructions (Addendum)
Jackson [name],   Thank you for coming in to clinic today.  1. [facility] all [facility].  2. Stay active,  Eat a healthy diet.   Labs in 7 days if not completed at the [place].  Bring lab reports with you from your recent visits and your [facility] physical.   Please schedule a follow-up appointment with Cassell Smiles, AGNP. Return in about 1 year (around 07/28/2019) for annual physical.  If you have any other questions or concerns, please feel free to call the clinic or send a message through [place]. You may also schedule an earlier appointment if necessary.  You will receive a survey after today's visit either digitally by e-mail or paper by [employer]. Your experiences and feedback matter to [place].  Please respond so we know how we are doing as we provide care for you.   Cassell Smiles, DNP, AGNP-BC Adult Gerontology Nurse Practitioner [place]

## 2018-07-28 NOTE — Progress Notes (Signed)
Subjective:    Patient ID: Jackson [name], male    DOB: [birth year], 73 y.o.   MRN: [id number]  Jackson [name] is a 73 y.o. male presenting on 07/28/2018 for Annual Exam   HPI Annual Physical Exam Patient has been feeling well overall.  They have no acute concerns today. Sleeps 3-3.5 hours per night interrupted - PTSD is impacting sleep.  No daytime naps. Will be starting PTSD treatment at [facility] tomorrow. - Has had sepsis - no known source - Don't know where infection is coming from.  Patient did have kidney stones that were removed.  Headache  Some days headache is bad - continues headache treatment with [place] [facility].  Patient did have agent orange exposure with [place]. - Left kidney remains swollen and infected - treating with doxycycline for kidney infection and sinus congestion acute care.  Patient has had IV antibiotics x 12 days.  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 Agent orange exposure   . Baker's cyst of knee, left   . Blood infection ([place]) 02/16/2018   [facility]   . Cancer ([place])    testicular  . Hearing loss d/t noise   . Kidney stones    Past Surgical History:  Procedure Laterality Date  . APPENDECTOMY  1996  . BACK SURGERY  1996   Fusion L4-L5; Bulging disks L2-L4. L5-S1  . EYE SURGERY Bilateral 1992   Radical Kernotomy  . KIDNEY STONE SURGERY Left 05/18/2018   [facility]   . KIDNEY STONE SURGERY Right 04/16/2018   done at [facility]  History  . Marital status: Divorced    Spouse name: Not on file  . Number of children: Not on file  . Years of education: Not on file  . Highest education level: Associate degree: academic program  Occupational History  . Occupation: retired  Scientific laboratory technician  . Financial resource strain: Not hard at all  . Food insecurity:    Worry: Never true    Inability: Never true  . Transportation needs:    Medical: No    Non-medical: No  Tobacco Use  . Smoking status: Never Smoker  . Smokeless tobacco: Never Used  Substance and Sexual Activity  . Alcohol use: No    Comment: former  . Drug use: No  . Sexual activity: Not Currently  Lifestyle  . Physical activity:    Days per week: 0 days    Minutes per session: 0 min  . Stress: Not at all  Relationships  . Social connections:    Talks on phone: More than three times a week    Gets together: More than three times a week    Attends religious service: Never    Active member of club or organization: Yes    Attends meetings of clubs or organizations: More than 4 times per year    Relationship status: Divorced  . Intimate partner violence:    Fear of current or ex partner: No    Emotionally abused: No  Physically abused: No    Forced sexual activity: No  Other Topics Concern  . Not on file  Social History Narrative   [facility] veteran groups    Family History  Problem Relation Age of Onset  . Cerebral aneurysm Mother   . Heart attack Father   . Breast cancer Sister   . Breast cancer Daughter    Current Outpatient Medications on File Prior to Visit  Medication Sig  . doxycycline (VIBRAMYCIN) 100 MG capsule Take 100 mg by mouth 2 (two) times daily.  Marland Kitchen DOXYCYCLINE HYCLATE PO Take by mouth.   Current Facility-Administered Medications on File Prior to Visit  Medication  . albuterol (PROVENTIL) (2.5 MG/3ML) 0.083% nebulizer solution 2.5 mg    Review of Systems Per HPI unless specifically indicated above     Objective:     BP (!) 126/52 (BP Location: Left Arm, Patient Position: Sitting, Cuff Size: Normal)   Pulse 72   Temp 98.6 F (37 C) (Oral)   Ht 5\' 9"  (1.753 m)   Wt 185 lb 9.6 oz (84.2 kg)   BMI 27.41 kg/m   Wt Readings from Last 3 Encounters:  07/28/18 185 lb 9.6 oz (84.2 kg)  07/13/18 184 lb (83.5 kg)  12/28/17 180 lb (81.6 kg)    Physical Exam Vitals signs and nursing note reviewed.  Constitutional:      General: He is not in acute distress.    Appearance: Normal appearance. He is well-developed and normal weight.  HENT:     Head: Normocephalic and atraumatic.     Right Ear: Tympanic membrane, ear canal and external ear normal.     Left Ear: Tympanic membrane, ear canal and external ear normal.     Nose: Nose normal.     Mouth/Throat:     Mouth: Mucous membranes are moist.     Pharynx: Oropharynx is clear.     Comments: Patient wears full dentures (top/bottom) Eyes:     Conjunctiva/sclera: Conjunctivae normal.     Pupils: Pupils are equal, round, and reactive to light.  Neck:     Musculoskeletal: Normal range of motion and neck supple.     Thyroid: No thyromegaly.     Vascular: No JVD.     Trachea: No tracheal deviation.  Cardiovascular:     Rate and Rhythm: Normal rate and regular rhythm.     Heart sounds: Normal heart sounds. No murmur. No friction rub. No gallop.   Pulmonary:     Effort: Pulmonary effort is normal. No respiratory distress.     Breath sounds: Normal breath sounds.  Abdominal:     General: Abdomen is flat. Bowel sounds are normal. There is no distension.     Palpations: Abdomen is soft. There is no mass.     Tenderness: There is no abdominal tenderness. There is no guarding or rebound.     Hernia: No hernia is present.  Musculoskeletal: Normal range of motion.  Lymphadenopathy:     Cervical: No cervical adenopathy.  Skin:    General: Skin is warm and dry.     Capillary Refill: Capillary refill takes less than 2 seconds.  Neurological:     General: No  focal deficit present.     Mental Status: He is alert and oriented to person, place, and time. Mental status is at baseline.     Cranial Nerves: No cranial nerve deficit.  Psychiatric:        Mood and Affect: Mood normal.  Behavior: Behavior normal.        Thought Content: Thought content normal.        Judgment: Judgment normal.    Results for orders placed or performed during the hospital encounter of 12/28/17  Culture, blood (Routine x 2)  Result Value Ref Range   Specimen Description BLOOD LEFT ANTECUBITAL    Special Requests      BOTTLES DRAWN AEROBIC AND ANAEROBIC Blood Culture adequate volume Performed at [facility], [address] [place], [place] [address]    Culture  Setup Time      IN BOTH AEROBIC AND ANAEROBIC BOTTLES GRAM POSITIVE COCCI CRITICAL VALUE NOTED.  VALUE IS CONSISTENT WITH PREVIOUSLY REPORTED AND CALLED VALUE. Performed at [facility], [address] [place], [place] [address]    Culture STAPHYLOCOCCUS EPIDERMIDIS (A)    Report Status 01/01/2018 FINAL    Organism ID, Bacteria STAPHYLOCOCCUS EPIDERMIDIS       Susceptibility   Staphylococcus epidermidis - MIC*    CIPROFLOXACIN >=8 RESISTANT Resistant     ERYTHROMYCIN >=8 RESISTANT Resistant     GENTAMICIN <=0.5 SENSITIVE Sensitive     OXACILLIN <=0.25 SENSITIVE Sensitive     TETRACYCLINE 4 SENSITIVE Sensitive     VANCOMYCIN <=0.5 SENSITIVE Sensitive     TRIMETH/SULFA 160 RESISTANT Resistant     CLINDAMYCIN >=8 RESISTANT Resistant     RIFAMPIN <=0.5 SENSITIVE Sensitive     Inducible Clindamycin NEGATIVE Sensitive     * STAPHYLOCOCCUS EPIDERMIDIS  Culture, blood (Routine x 2)  Result Value Ref Range   Specimen Description BLOOD RIGHT ANTECUBITAL    Special Requests      BOTTLES DRAWN AEROBIC AND ANAEROBIC Blood Culture results may not be optimal due to an excessive volume of blood received in culture bottles Performed at [facility], [address]  [place], [place] [address]    Culture  Setup Time      GRAM POSITIVE COCCI GRAM VARIABLE ROD IN BOTH AEROBIC AND ANAEROBIC BOTTLES AEROBIC BOTTLE GPC ONLY SEEN CRITICAL RESULT CALLED TO, READ BACK BY AND VERIFIED WITH: STEPHANIE RUDD AT 7867 12/29/17 SDR    Culture (A)     STAPHYLOCOCCUS EPIDERMIDIS BACILLUS SPECIES SUSCEPTIBILITIES PERFORMED ON PREVIOUS CULTURE WITHIN THE LAST 5 DAYS. Performed at [facility], [place] [address], [place], [place] [address]    Report Status 01/01/2018 FINAL   Urine Culture  Result Value Ref Range   Specimen Description      URINE, RANDOM Performed at [facility], [address], [place], [place] [address]    Special Requests      NONE Performed at [facility], [address] [place], [place] [address]    Culture      NO GROWTH Performed at [facility], [place] [address] [place],  [address]    Report Status 12/30/2017 FINAL   Blood Culture ID Panel (Reflexed)  Result Value Ref Range   Enterococcus species NOT DETECTED NOT DETECTED   Listeria monocytogenes NOT DETECTED NOT DETECTED   Staphylococcus species DETECTED (A) NOT DETECTED   Staphylococcus aureus (BCID) NOT DETECTED NOT DETECTED   Methicillin resistance NOT DETECTED NOT DETECTED   Streptococcus species NOT DETECTED NOT DETECTED   Streptococcus agalactiae NOT DETECTED NOT DETECTED   Streptococcus pneumoniae NOT DETECTED NOT DETECTED   Streptococcus pyogenes NOT DETECTED NOT DETECTED   Acinetobacter baumannii NOT DETECTED NOT DETECTED   Enterobacteriaceae species NOT DETECTED NOT DETECTED   Enterobacter cloacae complex  NOT DETECTED NOT DETECTED   Escherichia coli NOT DETECTED NOT DETECTED   Klebsiella oxytoca NOT DETECTED NOT DETECTED   Klebsiella pneumoniae NOT DETECTED NOT DETECTED   Proteus species NOT DETECTED NOT DETECTED   Serratia marcescens NOT DETECTED NOT DETECTED   Haemophilus influenzae NOT DETECTED NOT DETECTED   Neisseria  meningitidis NOT DETECTED NOT DETECTED   Pseudomonas aeruginosa NOT DETECTED NOT DETECTED   Candida albicans NOT DETECTED NOT DETECTED   Candida glabrata NOT DETECTED NOT DETECTED   Candida krusei NOT DETECTED NOT DETECTED   Candida parapsilosis NOT DETECTED NOT DETECTED   Candida tropicalis NOT DETECTED NOT DETECTED  Comprehensive metabolic panel  Result Value Ref Range   Sodium 135 135 - 145 mmol/L   Potassium 3.1 (L) 3.5 - 5.1 mmol/L   Chloride 105 98 - 111 mmol/L   CO2 23 22 - 32 mmol/L   Glucose, Bld 176 (H) 70 - 99 mg/dL   BUN 12 8 - 23 mg/dL   Creatinine, Ser 1.51 (H) 0.61 - 1.24 mg/dL   Calcium 7.7 (L) 8.9 - 10.3 mg/dL   Total Protein 5.7 (L) 6.5 - 8.1 g/dL   Albumin 3.2 (L) 3.5 - 5.0 g/dL   AST 30 15 - 41 U/L   ALT 9 0 - 44 U/L   Alkaline Phosphatase 61 38 - 126 U/L   Total Bilirubin 2.6 (H) 0.3 - 1.2 mg/dL   GFR calc non Af Amer 44 (L) >60 mL/min   GFR calc Af Amer 51 (L) >60 mL/min   Anion gap 7 5 - 15  Lactic acid, plasma  Result Value Ref Range   Lactic Acid, Venous 1.9 0.5 - 1.9 mmol/L  CBC with Differential  Result Value Ref Range   WBC 4.6 3.8 - 10.6 K/uL   RBC 2.85 (L) 4.40 - 5.90 MIL/uL   Hemoglobin 10.3 (L) 13.0 - 18.0 g/dL   HCT 28.5 (L) 40.0 - 52.0 %   MCV 99.9 80.0 - 100.0 fL   MCH 36.2 (H) 26.0 - 34.0 pg   MCHC 36.2 (H) 32.0 - 36.0 g/dL   RDW 13.5 11.5 - 14.5 %   Platelets 131 (L) 150 - 440 K/uL   Neutrophils Relative % 90 %   Neutro Abs 4.1 1.4 - 6.5 K/uL   Lymphocytes Relative 7 %   Lymphs Abs 0.3 (L) 1.0 - 3.6 K/uL   Monocytes Relative 3 %   Monocytes Absolute 0.1 (L) 0.2 - 1.0 K/uL   Eosinophils Relative 0 %   Eosinophils Absolute 0.0 0 - 0.7 K/uL   Basophils Relative 0 %   Basophils Absolute 0.0 0 - 0.1 K/uL  Protime-INR  Result Value Ref Range   Prothrombin Time 14.8 11.4 - 15.2 seconds   INR 1.17   Urinalysis, Complete w Microscopic  Result Value Ref Range   Color, Urine YELLOW (A) YELLOW   APPearance CLEAR (A) CLEAR   Specific  Gravity, Urine 1.011 1.005 - 1.030   pH 6.0 5.0 - 8.0   Glucose, UA NEGATIVE NEGATIVE mg/dL   Hgb urine dipstick MODERATE (A) NEGATIVE   Bilirubin Urine NEGATIVE NEGATIVE   Ketones, ur 5 (A) NEGATIVE mg/dL   Protein, ur NEGATIVE NEGATIVE mg/dL   Nitrite NEGATIVE NEGATIVE   Leukocytes, UA TRACE (A) NEGATIVE   RBC / HPF >50 (H) 0 - 5 RBC/hpf   WBC, UA 21-50 0 - 5 WBC/hpf   Bacteria, UA NONE SEEN NONE SEEN   Squamous Epithelial / LPF 0-5 0 -  5   Mucus PRESENT       Assessment & Plan:   Problem List Items Addressed This Visit    None    Visit Diagnoses    Encounter for annual physical exam    -  Primary   Prostate cancer screening       Relevant Orders   PSA   Encounter for lipid screening for cardiovascular disease       Relevant Orders   Lipid panel      Annual physical exam with no new findings.  Well adult with no acute concerns.  Plan: 1. Obtain health maintenance screenings as above according to age. - Increase physical activity to 30 minutes most days of the week.  - Eat healthy diet high in vegetables and fruits; low in refined carbohydrates. - Screening labs and tests as ordered.  Requested patient get remaining labs records from the [place] for tracking purposes 2. Return 1 year for annual physical.  Follow up plan: Return in about 1 year (around 07/28/2019) for annual physical.  Cassell Smiles, DNP, AGPCNP-BC Adult Gerontology Primary Care Nurse Practitioner [facility] 07/28/2018, 9:10 AM

## 2018-07-31 ENCOUNTER — Encounter: Payer: Self-pay | Admitting: Nurse Practitioner

## 2019-05-04 IMAGING — CT CT RENAL STONE PROTOCOL
2 of 4 series · 15 of 46 positions shown, 17 images · non-contrast
Comparison: None.

CLINICAL DATA: Right-sided flank pain for 1 day

EXAM:
CT ABDOMEN AND PELVIS WITHOUT CONTRAST
TECHNIQUE: Multidetector CT imaging of the abdomen and pelvis was performed
following the standard protocol without IV contrast.

[Series 2: stone full standard · axial · 0.67mm/px · z∈[-1326,-896]mm · 12 of 94 slices shown, 14 images]
[im 4/94  soft-tissue]
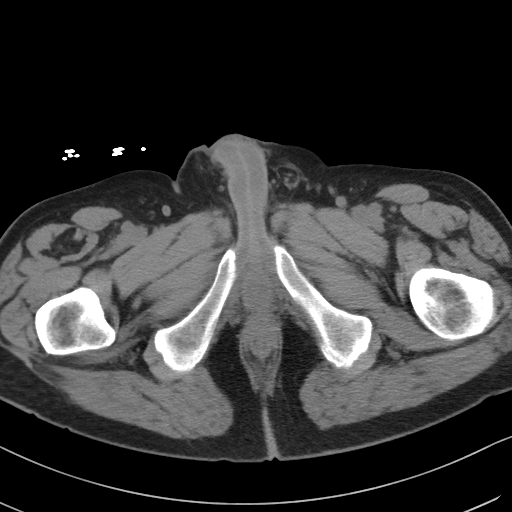
[im 4/94  bone]
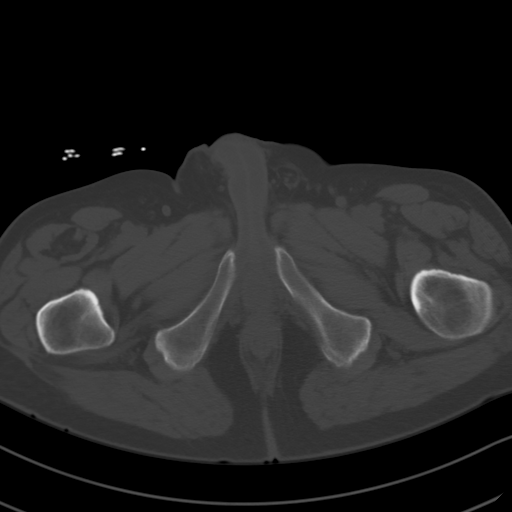
[im 12/94  soft-tissue]
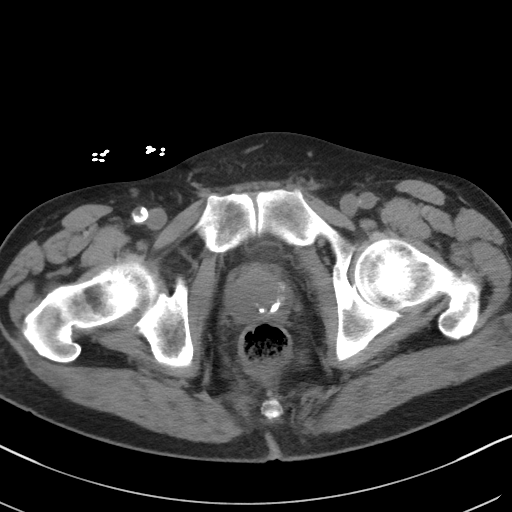
[im 20/94  soft-tissue]
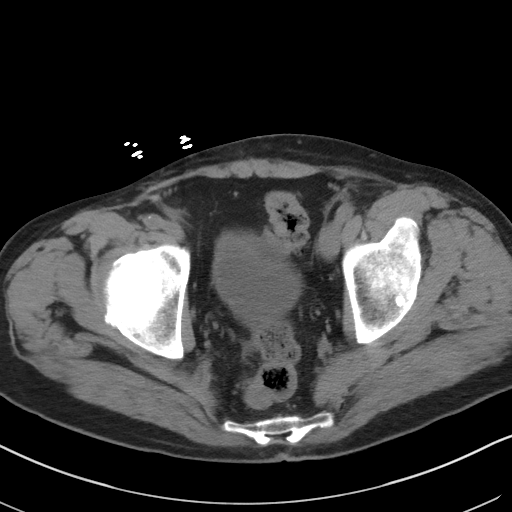
[im 28/94  soft-tissue]
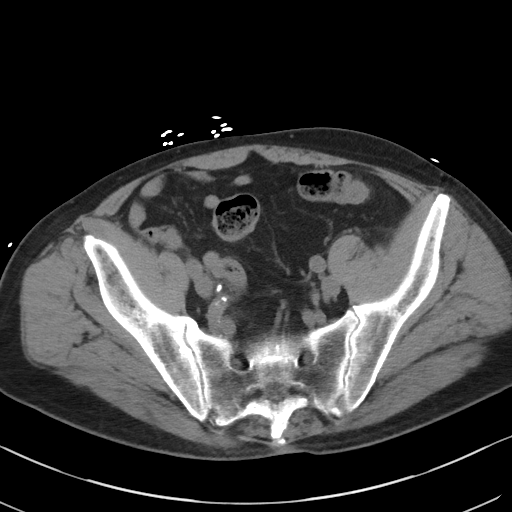
[im 35/94  soft-tissue]
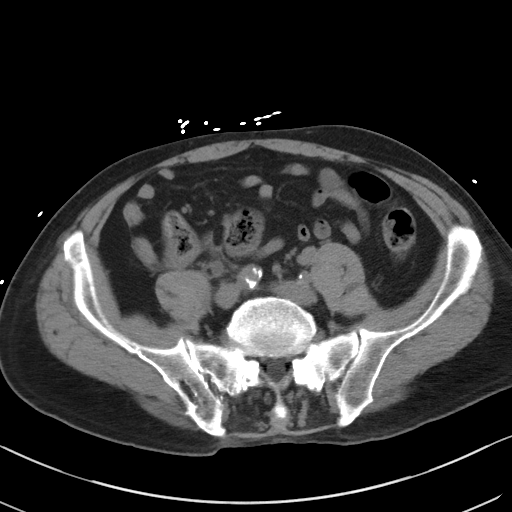
[im 43/94  soft-tissue]
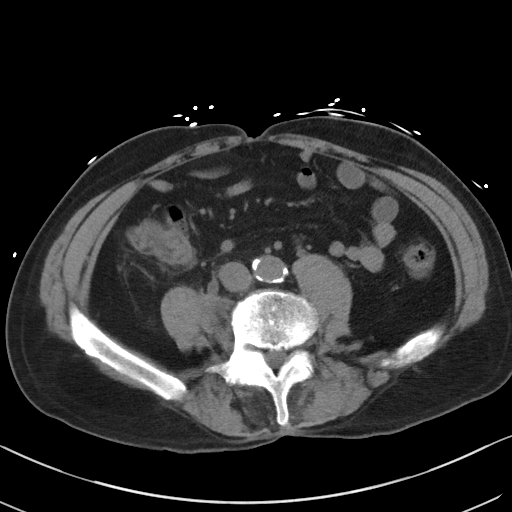
[im 51/94  soft-tissue]
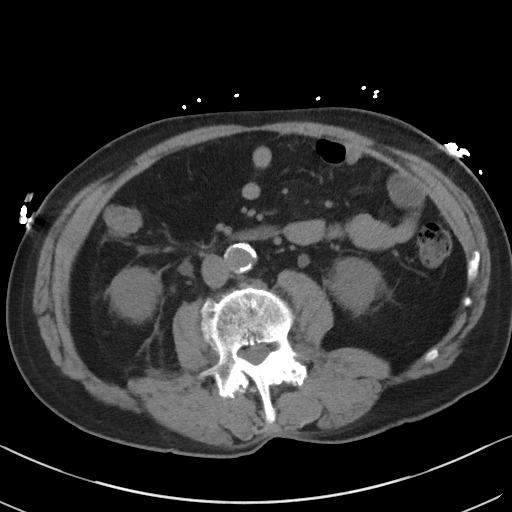
[im 59/94  soft-tissue]
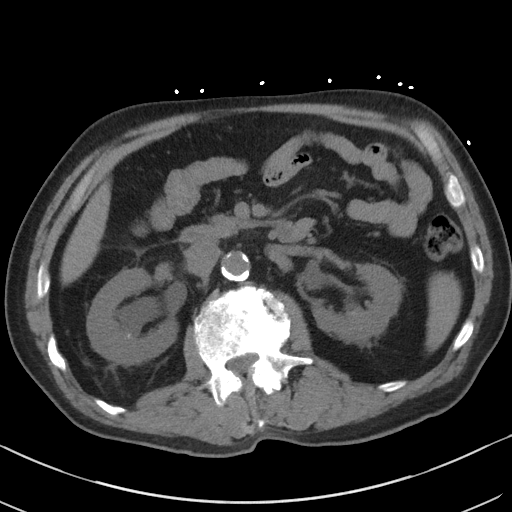
[im 66/94  soft-tissue]
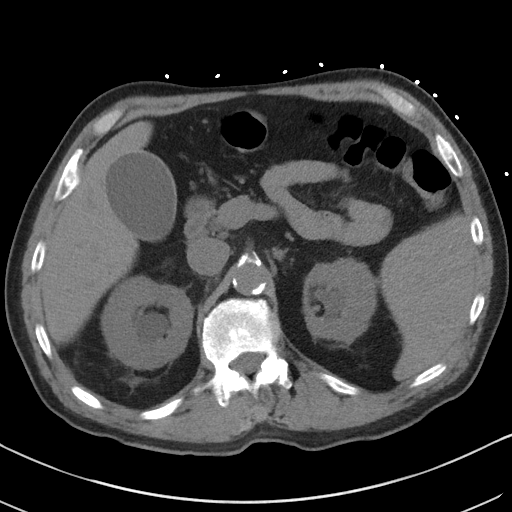
[im 66/94  bone]
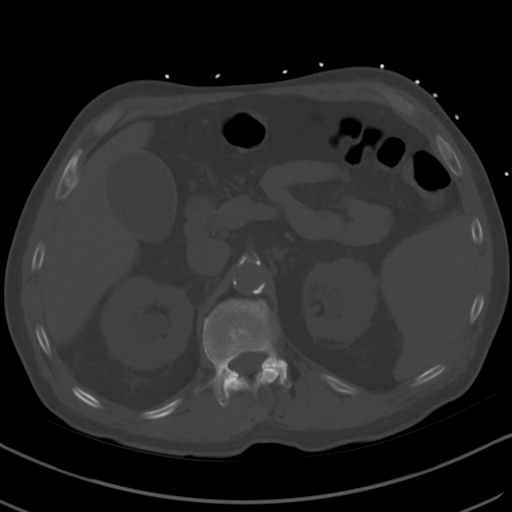
[im 74/94  soft-tissue]
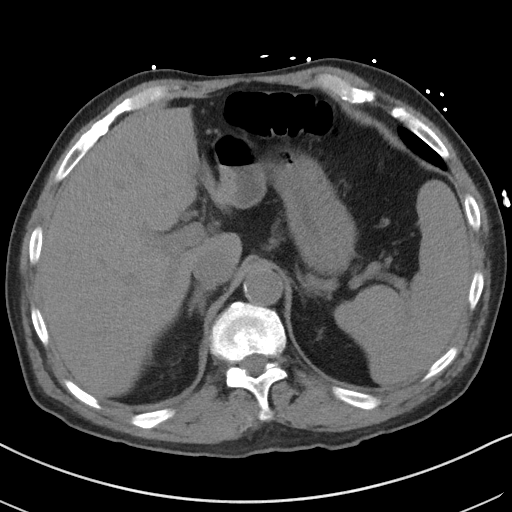
[im 82/94  soft-tissue]
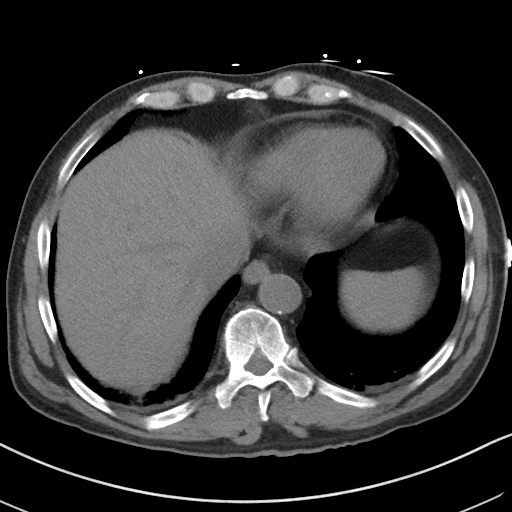
[im 90/94  soft-tissue]
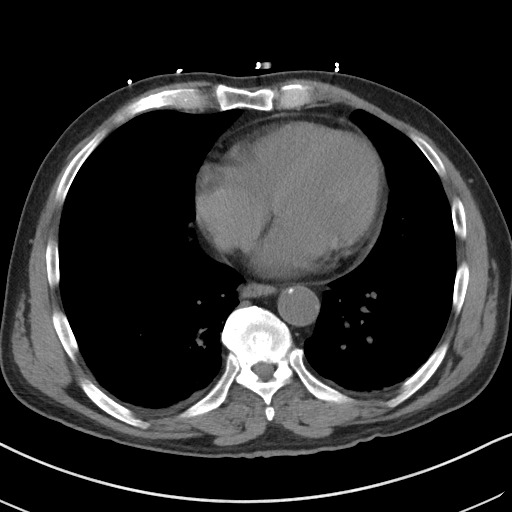

[Series 5: coronal · coronal · 0.73mm/px · 3 of 131 slices shown]
[im 44/131  soft-tissue]
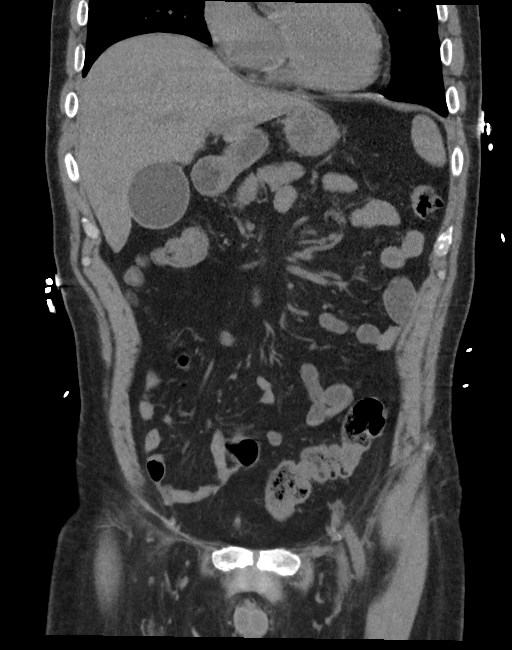
[im 58/131  soft-tissue]
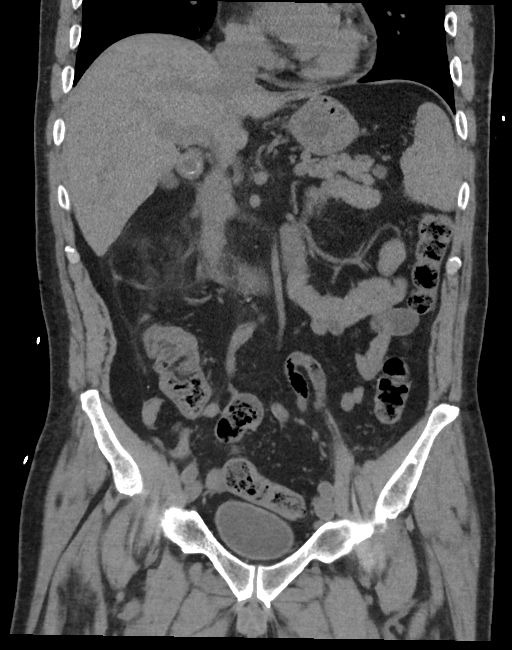
[im 73/131  soft-tissue]
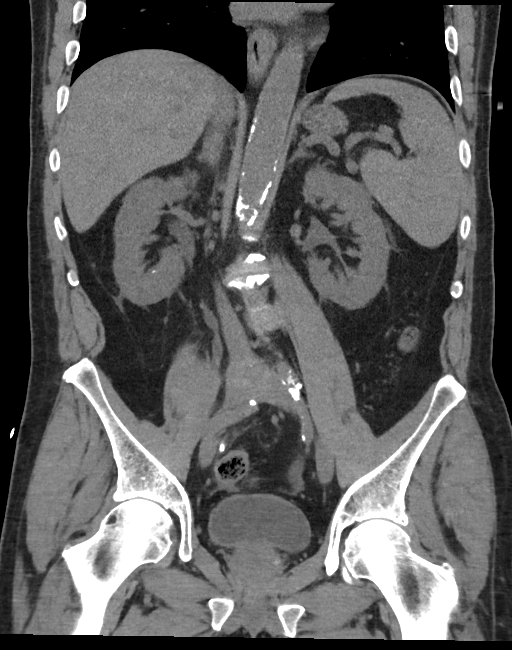

[15 of 46 positions shown; findings below may reference images not displayed]

FINDINGS: Lower chest: No acute abnormality.

Hepatobiliary: Liver is well visualized and within normal limits.
The gallbladder is well distended and demonstrates a gallstone
lodged within neck. HIDA scan may be helpful for further evaluation
as to the possibility of cystic duct obstruction. No wall thickening
or pericholecystic inflammatory changes are noted.

Pancreas: Unremarkable. No pancreatic ductal dilatation or
surrounding inflammatory changes.

Spleen: Normal in size without focal abnormality.

Adrenals/Urinary Tract: The adrenal glands are within normal limits.
The left kidney demonstrates nonobstructing stone in the lower pole
measuring almost 12 mm in dimension. Some fullness of the left
collecting system and ureter is noted although no true obstructive
changes are seen. On the right multiple renal calculi are identified
predominately in the lower pole. Hydronephrosis and hydroureter is
noted which extends to the mid to distal right ureter. A 7 mm stone
is noted in the distal ureter best seen on image number 67 of series
2 and image number 74 of series 5. The more distal right ureter is
within normal limits. The bladder is well distended.

Stomach/Bowel: The appendix has been surgically removed. No
obstructive or inflammatory changes are noted.

Vascular/Lymphatic: Vascular calcifications are noted without
aneurysmal dilatation. No significant lymphadenopathy is noted.

Reproductive: Prostate is unremarkable.

Other: No abdominal wall hernia or abnormality. No abdominopelvic
ascites.

Musculoskeletal: Degenerative changes of the lumbar spine are noted.
No acute bony abnormality is seen.
IMPRESSION: 7 mm distal right ureteral stone with obstructive changes. Bilateral
renal calculi are noted left greater than right as described.

Gallstone within the neck of the gallbladder. The gallbladder is
well distended although no definitive wall thickening or
pericholecystic fluid is noted. HIDA scan may be helpful for further
evaluation of possible cystic duct obstruction.

## 2020-06-18 IMAGING — US US ABDOMEN LIMITED
1 series · 14 of 25 positions shown · non-contrast
Comparison: CT abdomen and pelvis 12/29/2017

CLINICAL DATA: Right flank pain for 2 days.  Cholelithiasis at CT.

EXAM:
ULTRASOUND ABDOMEN LIMITED RIGHT UPPER QUADRANT

[Series 1: us abdomen limited · 0.24mm/px · 14 of 44 slices shown]
[im 1/44]
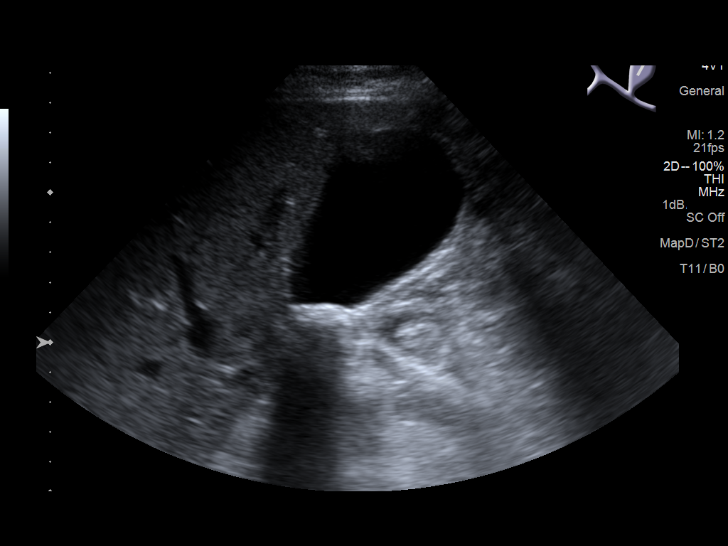
[im 4/44]
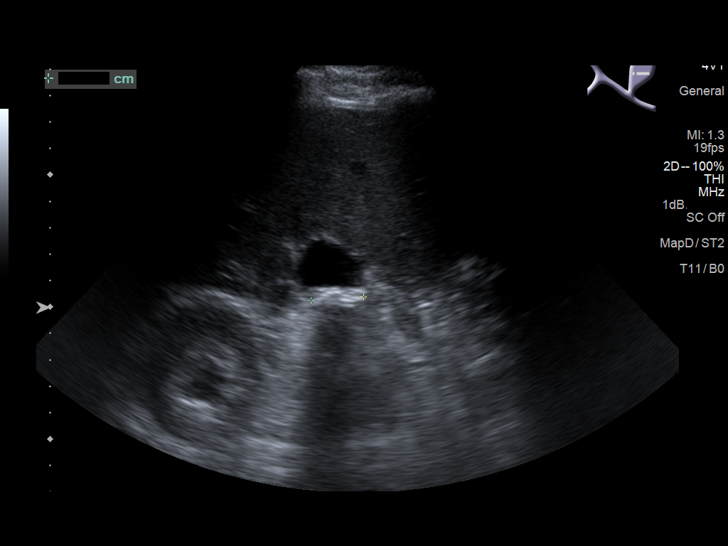
[im 8/44]
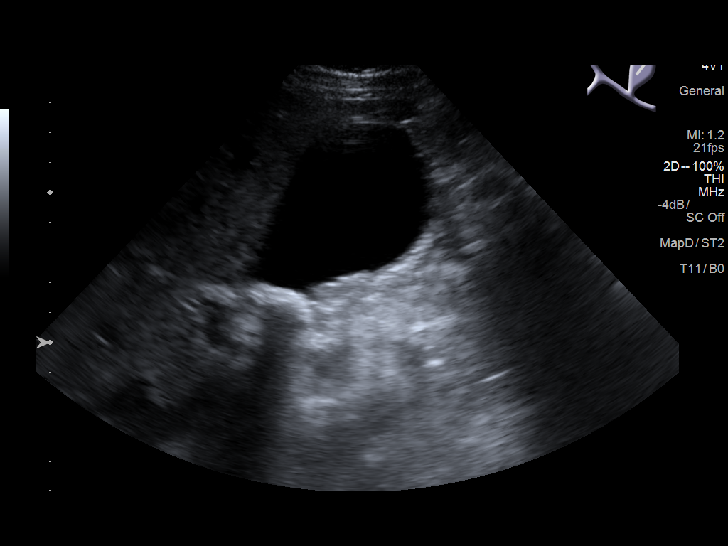
[im 11/44]
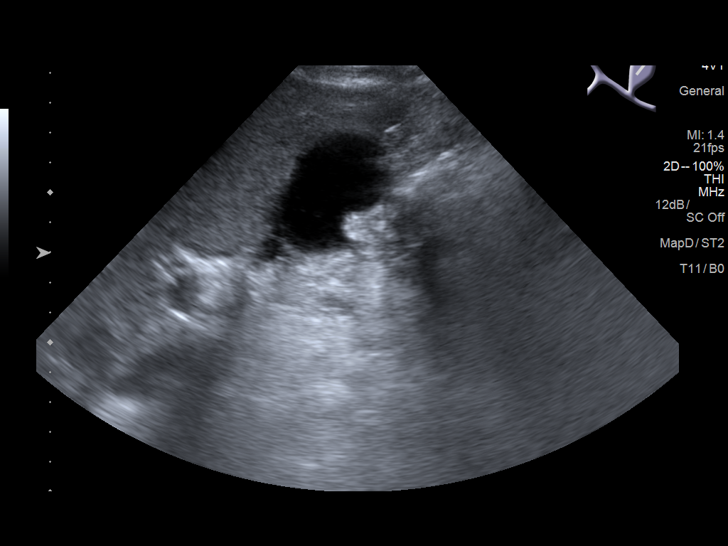
[im 15/44]
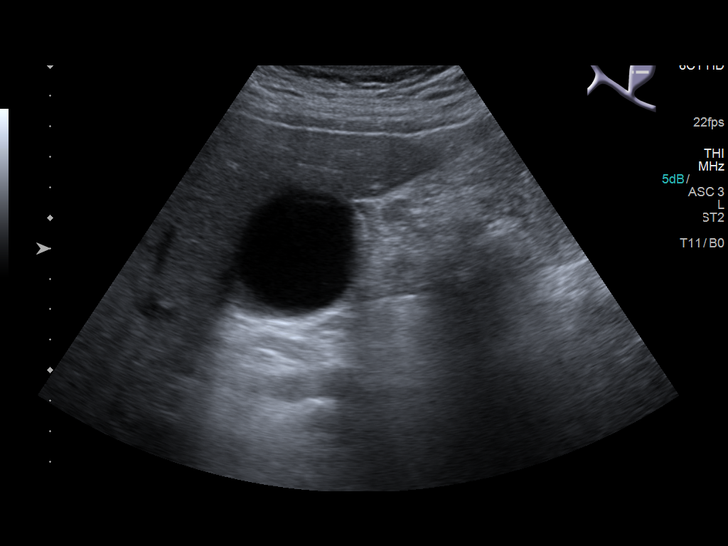
[im 17/44]
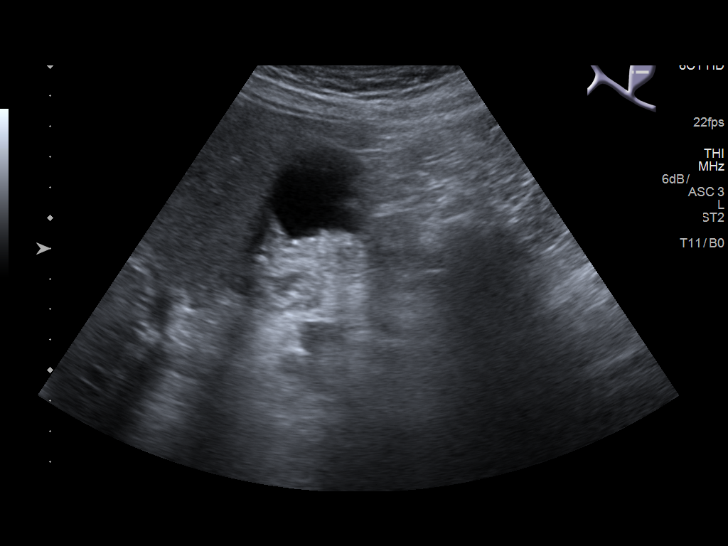
[im 20/44]
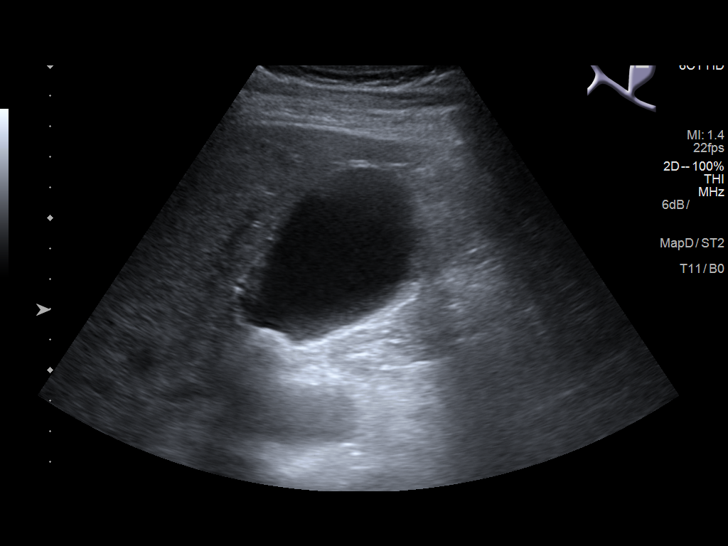
[im 24/44]
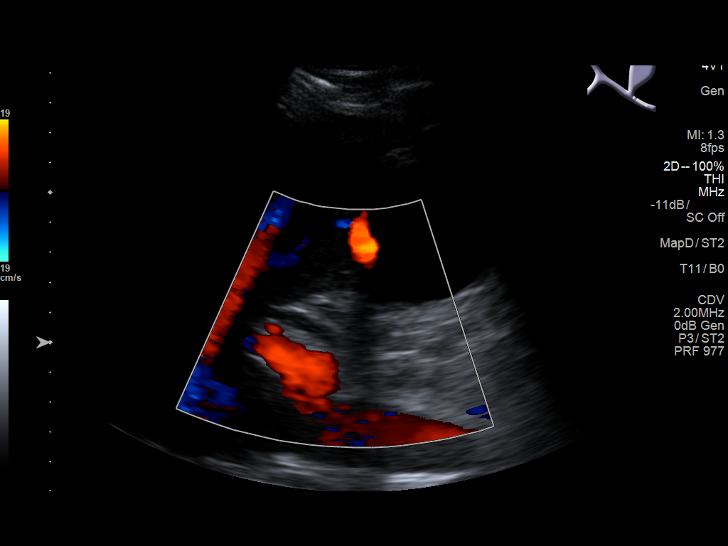
[im 27/44]
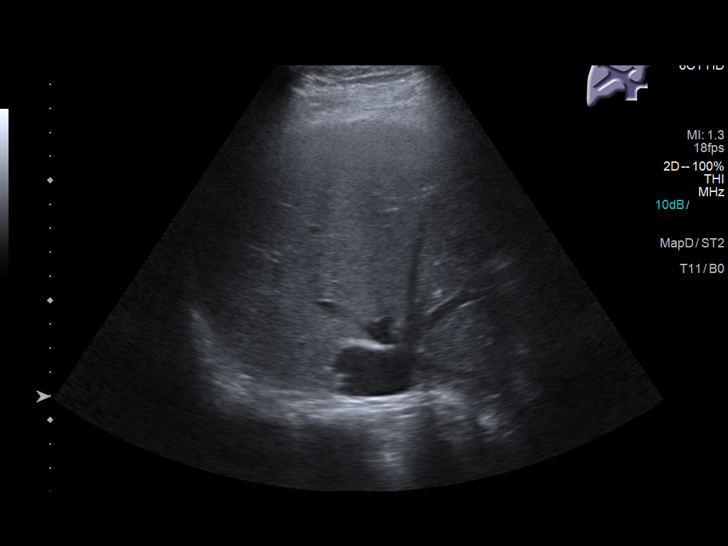
[im 29/44]
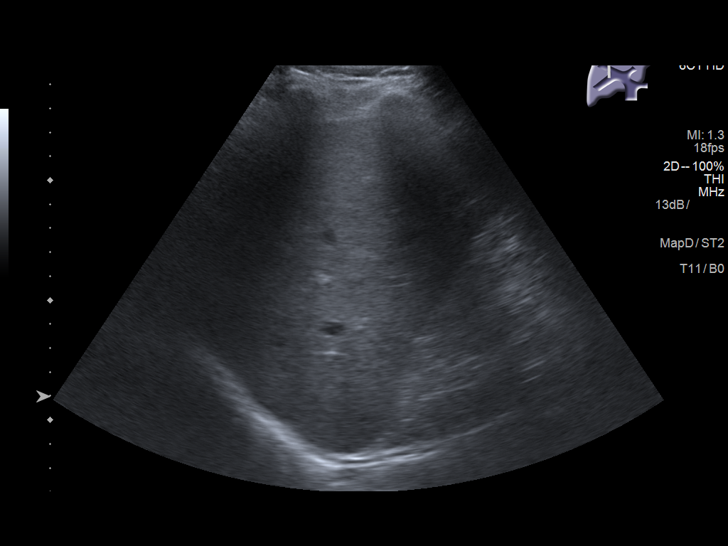
[im 33/44]
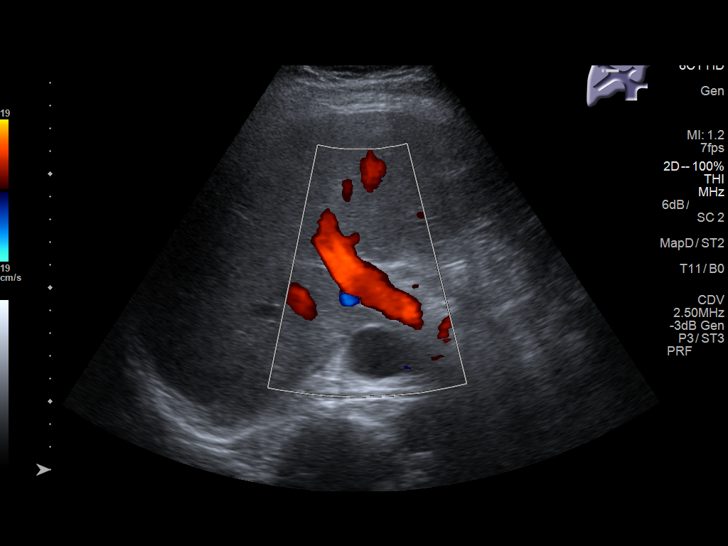
[im 36/44]
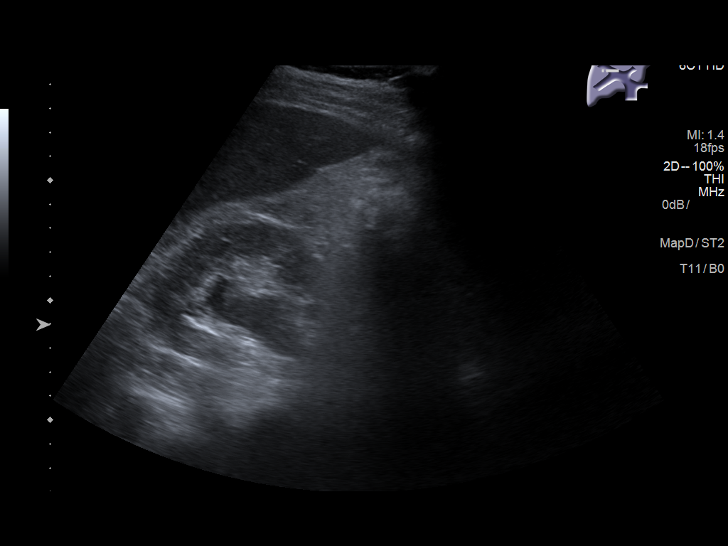
[im 40/44]
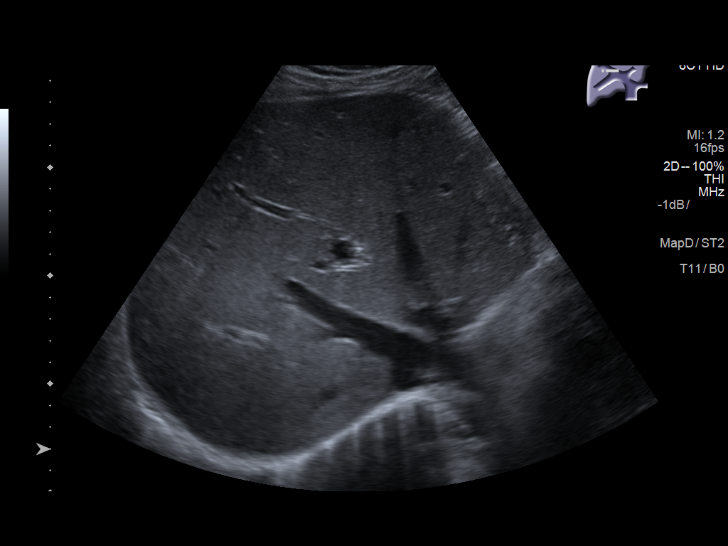
[im 44/44]
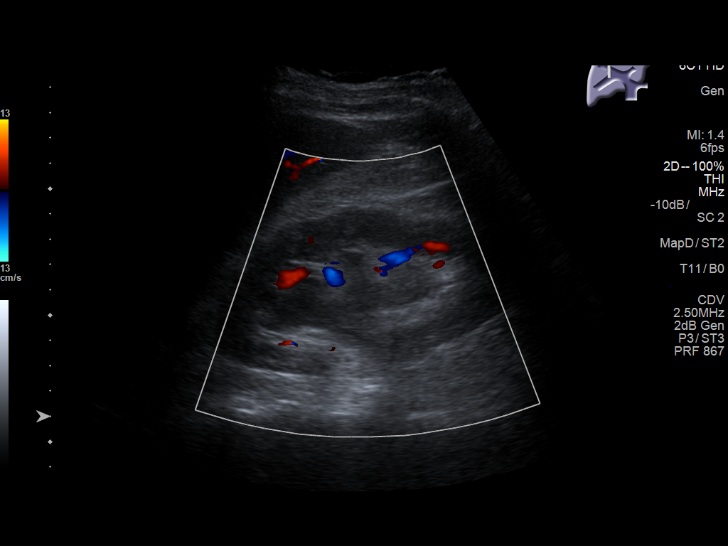

[14 of 25 positions shown; findings below may reference images not displayed]

FINDINGS: Gallbladder:

Large stone in the gallbladder neck measuring 1.9 cm in diameter.
Gallbladder sludge. No gallbladder wall thickening or edema.
Murphy's sign is negative. Murphy's sign is limited by patient
medication.

Common bile duct:

Diameter: 6.2 mm, normal

Liver:

No focal lesion identified. Within normal limits in parenchymal
echogenicity. Portal vein is patent on color Doppler imaging with
normal direction of blood flow towards the liver.

Mild hydronephrosis is demonstrated in the right kidney, as seen on
previous CT.
IMPRESSION: 1. Cholelithiasis with large stone in the gallbladder neck. No
definite additional changes to suggest cholecystitis.
2. Incidental note of right renal hydronephrosis.

## 2021-02-26 ENCOUNTER — Other Ambulatory Visit: Payer: Self-pay

## 2021-02-26 ENCOUNTER — Ambulatory Visit (HOSPITAL_COMMUNITY): Payer: No Typology Code available for payment source | Admitting: Licensed Clinical Social Worker

## 2021-03-05 ENCOUNTER — Ambulatory Visit (INDEPENDENT_AMBULATORY_CARE_PROVIDER_SITE_OTHER): Payer: No Typology Code available for payment source | Admitting: Licensed Clinical Social Worker

## 2021-03-05 ENCOUNTER — Other Ambulatory Visit: Payer: Self-pay

## 2021-03-05 ENCOUNTER — Encounter (HOSPITAL_COMMUNITY): Payer: Self-pay | Admitting: Licensed Clinical Social Worker

## 2021-03-05 DIAGNOSIS — F4310 Post-traumatic stress disorder, unspecified: Secondary | ICD-10-CM
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
## 2021-03-07 ENCOUNTER — Encounter (HOSPITAL_COMMUNITY): Payer: Self-pay

## 2021-03-07 NOTE — Plan of Care (Signed)
Pt participated in the development of the tx plan.

## 2021-03-11 ENCOUNTER — Other Ambulatory Visit: Payer: Self-pay

## 2021-03-11 ENCOUNTER — Ambulatory Visit (INDEPENDENT_AMBULATORY_CARE_PROVIDER_SITE_OTHER): Payer: No Typology Code available for payment source | Admitting: Licensed Clinical Social Worker

## 2021-03-11 ENCOUNTER — Encounter (HOSPITAL_COMMUNITY): Payer: Self-pay | Admitting: Licensed Clinical Social Worker

## 2021-03-11 DIAGNOSIS — F4310 Post-traumatic stress disorder, unspecified: Secondary | ICD-10-CM

## 2021-03-11 NOTE — Progress Notes (Signed)
Virtual Visit via Phone Note   I connected with [employer]. Knabe on 03/11/21 at 1:00pm EST by a phone-enabled telemedicine application and verified that I am speaking with the correct person using two identifiers.   I discussed the limitations of evaluation and management by telemedicine and the availability of in person appointments. The patient expressed understanding and agreed to proceed.   LOCATION: Patient: Home Provider: Home Office   History of Present Illness: Pt was referred to therapy by the [facility] for PTSD.    Participation Level: Active     Type of Therapy: Virtual Video individual therapy   Treatment Goals addressed: Improve psychiatric symptoms, Controlled Behavior, Moderated Mood, Improve Unhelpful Thought Patterns, Emotional Regulation Skills (Moderate moods, anger management, stress management), Feel and express a full Range of Emotions, Learn about Diagnosis, Healthy Coping Skills.   Interventions: CBT/Supportive/Psychoeducation   Summary: Patient presented for today's session on time and was alert, oriented x5, with no evidence or self-report of SI/HI or A/V H.  Patient reported ongoing compliance with medication.  Clinician inquired about patient's current emotional ratings, as well as any significant changes in thoughts, feelings or behavior since previous session. Patient's ratings 2/10 for depression, 2/10 for anxiety, 2/10 for anger/irritability. Today is the patient's first day of individual therapy. [name] and pt worked together in building a trusting, therapeutic relationship, reviewing treatment plan/strategies.     Plan: relationship with daughter/granddaughters (communication), thinking before talking,        Assessment and plan: Counselor will continue to meet with patient to address treatment plan goals. Patient will continue to follow recommendations of providers and implement skills learned in session.   Suicidal/Homicidal: Nowithout  intent/plan   Therapist Response: Assessed pt's current functioning and reviewed progress.. Assisted pt processing stressors, negative thoughts.  Assisted pt processing for the management of her stressors.   Participation Level: Active   Diagnosis:  PTSD     Follow Up Instructions: I discussed the assessment and treatment plan with the patient. The patient was provided an opportunity to ask questions and all were answered. The patient agreed with the plan and demonstrated an understanding of the instructions.   The patient was advised to call back or seek an in-person evaluation if the symptoms worsen or if the condition fails to improve as anticipated.   I provided 40 minutes of non-face-to-face time during this encounter.     Quashaun Lazalde S, LCAS

## 2021-03-20 ENCOUNTER — Other Ambulatory Visit: Payer: Self-pay

## 2021-03-20 ENCOUNTER — Encounter (HOSPITAL_COMMUNITY): Payer: Self-pay | Admitting: Licensed Clinical Social Worker

## 2021-03-20 ENCOUNTER — Ambulatory Visit (INDEPENDENT_AMBULATORY_CARE_PROVIDER_SITE_OTHER): Payer: No Typology Code available for payment source | Admitting: Licensed Clinical Social Worker

## 2021-03-20 DIAGNOSIS — F4310 Post-traumatic stress disorder, unspecified: Secondary | ICD-10-CM | POA: Diagnosis not present

## 2021-03-20 NOTE — Progress Notes (Signed)
Virtual Visit via Phone Note   I connected with [employer] Madlock on 03/20/21 at 3:00pm EST by a phone-enabled telemedicine application and verified that I am speaking with the correct person using two identifiers.   I discussed the limitations of evaluation and management by telemedicine and the availability of in person appointments. The patient expressed understanding and agreed to proceed.   LOCATION: Patient: Home Provider: Home Office   History of Present Illness: Pt was referred to therapy by the [facility] for PTSD.    Participation Level: Active     Type of Therapy: Virtual Video individual therapy   Treatment Goals addressed: Improve psychiatric symptoms, Controlled Behavior, Moderated Mood, Improve Unhelpful Thought Patterns, Emotional Regulation Skills (Moderate moods, anger management, stress management), Feel and express a full Range of Emotions, Learn about Diagnosis, Healthy Coping Skills.   Interventions: CBT/Supportive/Psychoeducation   Summary: Patient presented for today's session on time and was alert, oriented x5, with no evidence or self-report of SI/HI or A/V H.  Patient reported ongoing compliance with medication.  Clinician inquired about patient's current emotional ratings, as well as any significant changes in thoughts, feelings or behavior since previous session. Patient's ratings 2/10 for depression, 2/10 for anxiety, 3/10 for anger/irritability. 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 [name] and pt role played communication skills, walking away and problem solving. Pt reports that the relationship with his granddaughter has improved since he shows them gratitude for calling him daily. Pt turned in more disability paperwork at the [place] office in [place]: relationship with daughter/granddaughters  (communication), thinking before talking,        Assessment and plan: Counselor will continue to meet with patient to address treatment plan goals. Patient will continue to follow recommendations of providers and implement skills learned in session.   Suicidal/Homicidal: Nowithout intent/plan   Therapist Response: Assessed pt's current functioning and reviewed progress.. Assisted pt processing stressors, negative thoughts.  Assisted pt processing for the management of her stressors.   Participation Level: Active   Diagnosis:  PTSD     Follow Up Instructions: I discussed the assessment and treatment plan with the patient. The patient was provided an opportunity to ask questions and all were answered. The patient agreed with the plan and demonstrated an understanding of the instructions.   The patient was advised to call back or seek an in-person evaluation if the symptoms worsen or if the condition fails to improve as anticipated.   I provided 45 minutes of non-face-to-face time during this encounter.     Alexandrea Westergard S, LCAS

## 2021-03-27 ENCOUNTER — Other Ambulatory Visit: Payer: Self-pay

## 2021-03-27 ENCOUNTER — Encounter (HOSPITAL_COMMUNITY): Payer: Self-pay | Admitting: Licensed Clinical Social Worker

## 2021-03-27 ENCOUNTER — Ambulatory Visit (INDEPENDENT_AMBULATORY_CARE_PROVIDER_SITE_OTHER): Payer: No Typology Code available for payment source | Admitting: Licensed Clinical Social Worker

## 2021-03-27 DIAGNOSIS — F4310 Post-traumatic stress disorder, unspecified: Secondary | ICD-10-CM | POA: Diagnosis not present

## 2021-03-27 NOTE — Progress Notes (Signed)
Virtual Visit via Phone Note   I connected with [employer] Dern on 03/27/21 at 3:00pm EST by a phone-enabled telemedicine application and verified that I am speaking with the correct person using two identifiers.   I discussed the limitations of evaluation and management by telemedicine and the availability of in person appointments. The patient expressed understanding and agreed to proceed.   LOCATION: Patient: Home Provider: Home Office   History of Present Illness: Pt was referred to therapy by the [facility] for PTSD.    Participation Level: Active     Type of Therapy: Virtual Video individual therapy   Treatment Goals addressed: Improve psychiatric symptoms, Controlled Behavior, Moderated Mood, Improve Unhelpful Thought Patterns, Emotional Regulation Skills (Moderate moods, anger management, stress management), Feel and express a full Range of Emotions, Learn about Diagnosis, Healthy Coping Skills.   Interventions: CBT/Supportive/Psychoeducation   Summary: Patient presented for today's session on time and was alert, oriented x5, with no evidence or self-report of SI/HI or A/V H.  Patient reported ongoing compliance with medication.  Clinician inquired about patient's current emotional ratings, as well as any significant changes in thoughts, feelings or behavior since previous session. Patient's ratings 2/10 for depression, 2/10 for anxiety, 3/10 for anger/irritability. 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 Again, [name] and pt role played communication skills, walking away and problem solving.   PLAN: disability paperwork at the [place] office in [place]: relationship with daughter/granddaughters (communication), thinking before talking,        Assessment and plan: Counselor will continue to meet with patient to address  treatment plan goals. Patient will continue to follow recommendations of providers and implement skills learned in session.   Suicidal/Homicidal: Nowithout intent/plan   Therapist Response: Assessed pt's current functioning and reviewed progress.. Assisted pt processing stressors, negative thoughts.  Assisted pt processing for the management of her stressors.   Participation Level: Active   Diagnosis:  PTSD     Follow Up Instructions: I discussed the assessment and treatment plan with the patient. The patient was provided an opportunity to ask questions and all were answered. The patient agreed with the plan and demonstrated an understanding of the instructions.   The patient was advised to call back or seek an in-person evaluation if the symptoms worsen or if the condition fails to improve as anticipated.   I provided 45 minutes of non-face-to-face time during this encounter.     Jayra Choyce S, LCAS

## 2021-04-01 ENCOUNTER — Other Ambulatory Visit: Payer: Self-pay | Admitting: Nurse Practitioner

## 2021-04-01 DIAGNOSIS — N200 Calculus of kidney: Secondary | ICD-10-CM

## 2021-04-04 ENCOUNTER — Other Ambulatory Visit: Payer: Self-pay

## 2021-04-04 ENCOUNTER — Encounter (HOSPITAL_COMMUNITY): Payer: Self-pay | Admitting: Licensed Clinical Social Worker

## 2021-04-04 ENCOUNTER — Ambulatory Visit (INDEPENDENT_AMBULATORY_CARE_PROVIDER_SITE_OTHER): Payer: No Typology Code available for payment source | Admitting: Licensed Clinical Social Worker

## 2021-04-04 DIAGNOSIS — F4310 Post-traumatic stress disorder, unspecified: Secondary | ICD-10-CM | POA: Diagnosis not present

## 2021-04-04 NOTE — Progress Notes (Signed)
Virtual Visit via Phone Note   I connected with [employer] Spaziani on 04/04/21 at 9:00 am EST by a phone-enabled telemedicine application and verified that I am speaking with the correct person using two identifiers.   I discussed the limitations of evaluation and management by telemedicine and the availability of in person appointments. The patient expressed understanding and agreed to proceed.   LOCATION: Patient: Home Provider: Home Office   History of Present Illness: Pt was referred to therapy by the [facility] for PTSD.    Participation Level: Active     Type of Therapy: Virtual Video individual therapy   Treatment Goals addressed: Improve psychiatric symptoms, Controlled Behavior, Moderated Mood, Improve Unhelpful Thought Patterns, Emotional Regulation Skills (Moderate moods, anger management, stress management), Feel and express a full Range of Emotions, Learn about Diagnosis, Healthy Coping Skills.   Interventions: CBT/Supportive/Psychoeducation   Summary: Patient presented for today's session on time and was alert, oriented x5, with no evidence or self-report of SI/HI or A/V H.  Patient reported ongoing compliance with medication.  Clinician inquired about patient's current emotional ratings, as well as any significant changes in thoughts, feelings or behavior since previous session. Patient's ratings 6/10 for depression, 7/10 for anxiety, 6/10 for anger/irritability. Pt reports on his moods this week and coping skills. 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 [name] and pt role played communication skills, walking away and problem solving.   PLAN: disability paperwork at the [place] office in [place]: relationship with daughter/granddaughters (communication), thinking before talking,        Assessment and plan: Counselor  will continue to meet with patient to address treatment plan goals. Patient will continue to follow recommendations of providers and implement skills learned in session.   Suicidal/Homicidal: Nowithout intent/plan   Therapist Response: Assessed pt's current functioning and reviewed progress.. Assisted pt processing stressors, negative thoughts.  Assisted pt processing for the management of her stressors.   Participation Level: Active   Diagnosis:  PTSD     Follow Up Instructions: I discussed the assessment and treatment plan with the patient. The patient was provided an opportunity to ask questions and all were answered. The patient agreed with the plan and demonstrated an understanding of the instructions.   The patient was advised to call back or seek an in-person evaluation if the symptoms worsen or if the condition fails to improve as anticipated.   I provided 40 minutes of non-face-to-face time during this encounter.     Twisha Vanpelt S, LCAS

## 2021-04-08 ENCOUNTER — Encounter (HOSPITAL_COMMUNITY): Payer: Self-pay | Admitting: Licensed Clinical Social Worker

## 2021-04-08 ENCOUNTER — Other Ambulatory Visit: Payer: Self-pay

## 2021-04-08 ENCOUNTER — Ambulatory Visit (INDEPENDENT_AMBULATORY_CARE_PROVIDER_SITE_OTHER): Payer: No Typology Code available for payment source | Admitting: Licensed Clinical Social Worker

## 2021-04-08 DIAGNOSIS — F4310 Post-traumatic stress disorder, unspecified: Secondary | ICD-10-CM

## 2021-04-08 NOTE — Progress Notes (Signed)
Virtual Visit via Phone Note   I connected with [employer] Dalsanto on 04/08/21 at 2:00 pm EST by a phone-enabled telemedicine application and verified that I am speaking with the correct person using two identifiers.   I discussed the limitations of evaluation and management by telemedicine and the availability of in person appointments. The patient expressed understanding and agreed to proceed.   LOCATION: Patient: Home Provider: Home Office   History of Present Illness: Pt was referred to therapy by the [facility] for PTSD.    Participation Level: Active     Type of Therapy: Virtual Video individual therapy   Treatment Goals addressed: Improve psychiatric symptoms, Controlled Behavior, Moderated Mood, Improve Unhelpful Thought Patterns, Emotional Regulation Skills (Moderate moods, anger management, stress management), Feel and express a full Range of Emotions, Learn about Diagnosis, Healthy Coping Skills.   Interventions: CBT/Supportive/Psychoeducation   Summary: Patient presented for today's session on time and was alert, oriented x5, with no evidence or self-report of SI/HI or A/V H.  Patient reported ongoing compliance with medication.  Clinician inquired about patient's current emotional ratings, as well as any significant changes in thoughts, feelings or behavior since previous session. Patient's ratings 6/10 for depression, 7/10 for anxiety, 8/10 for anger/irritability. Pt reports on his moods this week and coping skills. 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 Clinician utilized MI OARS to reflect and summarize thoughts, feelings and concerns.Clinician explored updates on family discord.  [name] and pt role played communication skills, walking away and problem solving.   PLAN: disability paperwork at the [place] office in [place]: relationship with daughter/granddaughters (communication), thinking before talking,         Assessment and plan: Counselor will continue to meet with patient to address treatment plan goals. Patient will continue to follow recommendations of providers and implement skills learned in session.   Suicidal/Homicidal: Nowithout intent/plan   Therapist Response: Assessed pt's current functioning and reviewed progress.. Assisted pt processing stressors, negative thoughts.  Assisted pt processing for the management of her stressors.   Participation Level: Active   Diagnosis:  PTSD     Follow Up Instructions: I discussed the assessment and treatment plan with the patient. The patient was provided an opportunity to ask questions and all were answered. The patient agreed with the plan and demonstrated an understanding of the instructions.   The patient was advised to call back or seek an in-person evaluation if the symptoms worsen or if the condition fails to improve as anticipated.   I provided 40 minutes of non-face-to-face time during this encounter.     Aaliyha Mumford S, LCAS

## 2021-04-16 ENCOUNTER — Encounter (HOSPITAL_COMMUNITY): Payer: Self-pay | Admitting: Licensed Clinical Social Worker

## 2021-04-16 ENCOUNTER — Other Ambulatory Visit: Payer: Self-pay

## 2021-04-16 ENCOUNTER — Ambulatory Visit (INDEPENDENT_AMBULATORY_CARE_PROVIDER_SITE_OTHER): Payer: No Typology Code available for payment source | Admitting: Licensed Clinical Social Worker

## 2021-04-16 DIAGNOSIS — F4310 Post-traumatic stress disorder, unspecified: Secondary | ICD-10-CM | POA: Diagnosis not present

## 2021-04-16 NOTE — Progress Notes (Signed)
Virtual Visit via Phone Note   I connected with [employer] Lalley on 04/08/21 at 2:00 pm EST by a phone-enabled telemedicine application and verified that I am speaking with the correct person using two identifiers.   I discussed the limitations of evaluation and management by telemedicine and the availability of in person appointments. The patient expressed understanding and agreed to proceed.   LOCATION: Patient: Home Provider: Home Office   History of Present Illness: Pt was referred to therapy by the [facility] for PTSD.    Participation Level: Active     Type of Therapy: Virtual Video individual therapy   Treatment Goals addressed: Improve psychiatric symptoms, Controlled Behavior, Moderated Mood, Improve Unhelpful Thought Patterns, Emotional Regulation Skills (Moderate moods, anger management, stress management), Feel and express a full Range of Emotions, Learn about Diagnosis, Healthy Coping Skills.   Interventions: CBT/Supportive/Psychoeducation   Summary: Patient presented for today's session on time and was alert, oriented x5, with no evidence or self-report of SI/HI or A/V H.  Patient reported ongoing compliance with medication.  Clinician inquired about patient's current emotional ratings, as well as any significant changes in thoughts, feelings or behavior since previous session. Patient's ratings 6/10 for depression, 7/10 for anxiety, 8/10 for anger/irritability. Pt reports on his moods this week and coping skills. 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 Pt is struggling with emotional regulation. [name] provided education on emotional regulation skills to assist him in rethinking his current challenging situations to reduce his anger and anxiety, hiding visible signs of sadness or fear, and focusing on reasons to feel happy or calm. Pt practiced skills in session.  PLAN: disability paperwork at the [place] office in  [place]: relationship with daughter/granddaughters (communication), thinking before talking,        Assessment and plan: Counselor will continue to meet with patient to address treatment plan goals. Patient will continue to follow recommendations of providers and implement skills learned in session.   Suicidal/Homicidal: Nowithout intent/plan   Therapist Response: Assessed pt's current functioning and reviewed progress.. Assisted pt processing stressors, negative thoughts.  Assisted pt processing for the management of her stressors.   Participation Level: Active   Diagnosis:  PTSD     Follow Up Instructions: I discussed the assessment and treatment plan with the patient. The patient was provided an opportunity to ask questions and all were answered. The patient agreed with the plan and demonstrated an understanding of the instructions.   The patient was advised to call back or seek an in-person evaluation if the symptoms worsen or if the condition fails to improve as anticipated.   I provided 40 minutes of non-face-to-face time during this encounter.     Carlota Philley S, LCAS

## 2021-04-19 ENCOUNTER — Ambulatory Visit
Admission: RE | Admit: 2021-04-19 | Discharge: 2021-04-19 | Disposition: A | Discharge status: Home / Self Care | Payer: No Typology Code available for payment source | Source: Ambulatory Visit | Attending: Nurse Practitioner | Admitting: Nurse Practitioner

## 2021-04-19 ENCOUNTER — Other Ambulatory Visit: Payer: Self-pay

## 2021-04-19 DIAGNOSIS — N200 Calculus of kidney: Secondary | ICD-10-CM | POA: Insufficient documentation

## 2021-04-23 ENCOUNTER — Ambulatory Visit (HOSPITAL_COMMUNITY): Payer: Medicare PPO | Admitting: Licensed Clinical Social Worker

## 2021-04-25 ENCOUNTER — Encounter (HOSPITAL_COMMUNITY): Payer: Self-pay | Admitting: Licensed Clinical Social Worker

## 2021-04-25 ENCOUNTER — Ambulatory Visit (INDEPENDENT_AMBULATORY_CARE_PROVIDER_SITE_OTHER): Payer: No Typology Code available for payment source | Admitting: Licensed Clinical Social Worker

## 2021-04-25 ENCOUNTER — Other Ambulatory Visit: Payer: Self-pay

## 2021-04-25 DIAGNOSIS — F4310 Post-traumatic stress disorder, unspecified: Secondary | ICD-10-CM

## 2021-04-25 NOTE — Progress Notes (Signed)
Virtual Visit via Phone Note   I connected with [employer] Clapper on 04/25/21 at 2:00 pm EST by a phone-enabled telemedicine application and verified that I am speaking with the correct person using two identifiers.   I discussed the limitations of evaluation and management by telemedicine and the availability of in person appointments. The patient expressed understanding and agreed to proceed.   LOCATION: Patient: Home Provider: Home Office   History of Present Illness: Pt was referred to therapy by the [facility] for PTSD.    Participation Level: Active     Type of Therapy: Virtual Video individual therapy   Treatment Goals addressed: Improve psychiatric symptoms, Controlled Behavior, Moderated Mood, Improve Unhelpful Thought Patterns, Emotional Regulation Skills (Moderate moods, anger management, stress management), Feel and express a full Range of Emotions, Learn about Diagnosis, Healthy Coping Skills.   Interventions: CBT/Supportive/Psychoeducation   Summary: Patient presented for today's session on time and was alert, oriented x5, with no evidence or self-report of SI/HI or A/V H.  Patient reported ongoing compliance with medication.  Clinician inquired about patient's current emotional ratings, as well as any significant changes in thoughts, feelings or behavior since previous session. Patient's emotional ratings 6/10 for depression, 7/10 for anxiety, 7/10 for anger/irritability. Pt reports on his moods this week and coping skills. [name] utilized CBT  to assist pt processing his current stressors. [name] provided education on mindfulness-based coping skills. Pt practiced in session.   PLAN: disability paperwork at the [place] office in [place]: relationship with daughter/granddaughters (communication), thinking before talking,        Assessment and plan: Counselor will continue to meet with patient to address treatment plan goals. Patient will continue to follow  recommendations of providers and implement skills learned in session.   Suicidal/Homicidal: Nowithout intent/plan   Therapist Response: Assessed pt's current functioning and reviewed progress.. Assisted pt processing stressors, negative thoughts.  Assisted pt processing for the management of her stressors.   Participation Level: Active   Diagnosis:  PTSD     Follow Up Instructions: I discussed the assessment and treatment plan with the patient. The patient was provided an opportunity to ask questions and all were answered. The patient agreed with the plan and demonstrated an understanding of the instructions.   The patient was advised to call back or seek an in-person evaluation if the symptoms worsen or if the condition fails to improve as anticipated.   I provided 30 minutes of non-face-to-face time during this encounter.     Shye Doty S, LCAS

## 2021-05-01 ENCOUNTER — Encounter (HOSPITAL_COMMUNITY): Payer: Self-pay | Admitting: Licensed Clinical Social Worker

## 2021-05-01 ENCOUNTER — Other Ambulatory Visit: Payer: Self-pay

## 2021-05-01 ENCOUNTER — Ambulatory Visit (INDEPENDENT_AMBULATORY_CARE_PROVIDER_SITE_OTHER): Payer: No Typology Code available for payment source | Admitting: Licensed Clinical Social Worker

## 2021-05-01 DIAGNOSIS — F4310 Post-traumatic stress disorder, unspecified: Secondary | ICD-10-CM

## 2021-05-01 NOTE — Progress Notes (Signed)
Virtual Visit via Phone Note   I connected with [employer] [place]is on 05/01/21 at 2:00 pm EST by a phone-enabled telemedicine application and verified that I am speaking with the correct person using two identifiers.   I discussed the limitations of evaluation and management by telemedicine and the availability of in person appointments. The patient expressed understanding and agreed to proceed.   LOCATION: Patient: Home Provider: Home Office   History of Present Illness: Pt was referred to therapy by the [facility] for PTSD.    Participation Level: Active     Type of Therapy: Virtual Video individual therapy   Treatment Goals addressed: Improve psychiatric symptoms, Controlled Behavior, Moderated Mood, Improve Unhelpful Thought Patterns, Emotional Regulation Skills (Moderate moods, anger management, stress management), Feel and express a full Range of Emotions, Learn about Diagnosis, Healthy Coping Skills.   Interventions: CBT/Supportive/Psychoeducation   Summary: Patient presented for todays session on time and was alert, oriented x5, with no evidence or self-report of SI/HI or A/V H.  Patient reported ongoing compliance with medication.  Clinician inquired about patients current emotional ratings, as well as any significant changes in thoughts, feelings or behavior since previous session. Patient's emotional ratings 5/10 for depression, 5/10 for anxiety, 4/10 for anger/irritability. Pt reports on his moods this week and coping skills. Pt reports, "I'm sick, I think it's a sinus infection and I'm going to the [place] tomorrow to see my PCP." Pt was quiet during the session and reports his emotional ratings are down while he's been sick. Pt reports he's slowly stepping away from some of the duties of the [employer] he's a part of. Clinician utilized MI OARS to reflect and summarize his, thoughts, feelings and concerns. Due to pt not feeling well he asked to shorten the session  today.      PLAN: disability paperwork at the [place] office in [place]: relationship with daughter/granddaughters (communication), thinking before talking,        Assessment and plan: Counselor will continue to meet with patient to address treatment plan goals. Patient will continue to follow recommendations of providers and implement skills learned in session.   Suicidal/Homicidal: Nowithout intent/plan   Therapist Response: Assessed pt's current functioning and reviewed progress.. Assisted pt processing stressors, negative thoughts.  Assisted pt processing for the management of her stressors.   Participation Level: Active   Diagnosis:  PTSD     Follow Up Instructions: I discussed the assessment and treatment plan with the patient. The patient was provided an opportunity to ask questions and all were answered. The patient agreed with the plan and demonstrated an understanding of the instructions.   The patient was advised to call back or seek an in-person evaluation if the symptoms worsen or if the condition fails to improve as anticipated.   I provided 30 minutes of non-face-to-face time during this encounter.     Kiela Shisler S, LCAS

## 2021-05-07 ENCOUNTER — Ambulatory Visit (INDEPENDENT_AMBULATORY_CARE_PROVIDER_SITE_OTHER): Payer: No Typology Code available for payment source | Admitting: Licensed Clinical Social Worker

## 2021-05-07 ENCOUNTER — Encounter (HOSPITAL_COMMUNITY): Payer: Self-pay | Admitting: Licensed Clinical Social Worker

## 2021-05-07 ENCOUNTER — Other Ambulatory Visit: Payer: Self-pay

## 2021-05-07 DIAGNOSIS — F4310 Post-traumatic stress disorder, unspecified: Secondary | ICD-10-CM

## 2021-05-07 NOTE — Progress Notes (Signed)
Virtual Visit via Phone Note   I connected with [employer] Lefkowitz on 05/07/21 at 9:00 am EST by a phone-enabled telemedicine application and verified that I am speaking with the correct person using two identifiers.   I discussed the limitations of evaluation and management by telemedicine and the availability of in person appointments. The patient expressed understanding and agreed to proceed.   LOCATION: Patient: Home Provider: Home Office   History of Present Illness: Pt was referred to therapy by the [facility] for PTSD.    Participation Level: Active     Type of Therapy: Virtual Video individual therapy   Treatment Goals addressed: Improve psychiatric symptoms, Controlled Behavior, Moderated Mood, Improve Unhelpful Thought Patterns, Emotional Regulation Skills (Moderate moods, anger management, stress management), Feel and express a full Range of Emotions, Learn about Diagnosis, Healthy Coping Skills.   Interventions: CBT/Supportive/Psychoeducation   Summary: Patient presented for todays session on time and was alert, oriented x5, with no evidence or self-report of SI/HI or A/V H.  Patient reported ongoing compliance with medication.  Clinician inquired about patients current emotional ratings, as well as any significant changes in thoughts, feelings or behavior since previous session. Patient's emotional ratings 5/10 for depression, 5/10 for anxiety, 4/10 for anger/irritability. Pt reports on his moods this week and coping skills. Pt reports,he did go to the [place] and had a sinus infection but got medications, so now feels much better. "I'm doing better physically and emotionally." Pt reports he's decided to stay, for now, in the [employer] that I'm a part of. [name] provided education on control: :You only have control of yourself. Pt shared his holiday plans, feelings about the holiday and spending time with family (daughter and granddaughter). Clinician utilized CBT to  address thought processes.       PLAN: disability paperwork at the [place] office in [place]: relationship with daughter/granddaughters (communication), thinking before talking,        Assessment and plan: Counselor will continue to meet with patient to address treatment plan goals. Patient will continue to follow recommendations of providers and implement skills learned in session.   Suicidal/Homicidal: Nowithout intent/plan   Therapist Response: Assessed pt's current functioning and reviewed progress.. Assisted pt processing stressors, negative thoughts.  Assisted pt processing for the management of her stressors.   Participation Level: Active   Diagnosis:  PTSD     Follow Up Instructions: I discussed the assessment and treatment plan with the patient. The patient was provided an opportunity to ask questions and all were answered. The patient agreed with the plan and demonstrated an understanding of the instructions.   The patient was advised to call back or seek an in-person evaluation if the symptoms worsen or if the condition fails to improve as anticipated.   I provided 30 minutes of non-face-to-face time during this encounter.     Rosevelt Luu S, LCAS

## 2021-05-14 ENCOUNTER — Other Ambulatory Visit: Payer: Self-pay

## 2021-05-14 ENCOUNTER — Ambulatory Visit (INDEPENDENT_AMBULATORY_CARE_PROVIDER_SITE_OTHER): Payer: No Typology Code available for payment source | Admitting: Licensed Clinical Social Worker

## 2021-05-14 DIAGNOSIS — F4310 Post-traumatic stress disorder, unspecified: Secondary | ICD-10-CM | POA: Diagnosis not present

## 2021-05-16 ENCOUNTER — Encounter (HOSPITAL_COMMUNITY): Payer: Self-pay | Admitting: Licensed Clinical Social Worker

## 2021-05-16 NOTE — Progress Notes (Signed)
Virtual Visit via Phone Note   I connected with [employer] E[place] on 05/16/21 at 9:00 am EST by a phone-enabled telemedicine application and verified that I am speaking with the correct person using two identifiers.   I discussed the limitations of evaluation and management by telemedicine and the availability of in person appointments. The patient expressed understanding and agreed to proceed.   LOCATION: Patient: Home Provider: Home Office   History of Present Illness: Pt was referred to therapy by the [facility] for PTSD.    Participation Level: Active     Type of Therapy: Virtual Video individual therapy   Treatment Goals addressed: Improve psychiatric symptoms, Controlled Behavior, Moderated Mood, Improve Unhelpful Thought Patterns, Emotional Regulation Skills (Moderate moods, anger management, stress management), Feel and express a full Range of Emotions, Learn about Diagnosis, Healthy Coping Skills.   Interventions: CBT/Supportive/Psychoeducation   Summary: Patient presented for todays session on time and was alert, oriented x5, with no evidence or self-report of SI/HI or A/V H.  Patient reported ongoing compliance with medication.  Clinician inquired about patients current emotional ratings, as well as any significant changes in thoughts, feelings or behavior since previous session. Patient's emotional ratings 5/10 for depression, 5/10 for anxiety, 4/10 for anger/irritability. Pt reports on his moods this week and coping skills. "I feel like i'm doing better physically and emotionally." Pt shared about his holidays, with emotions surrounding the holidays. Clinician utilized MI OARS to reflect and summarize thoughts and feelings.       PLAN: disability paperwork at the [place] office in [place]: relationship with daughter/granddaughters (communication), thinking before talking,        Assessment and plan: Counselor will continue to meet with patient to address  treatment plan goals. Patient will continue to follow recommendations of providers and implement skills learned in session.   Suicidal/Homicidal: Nowithout intent/plan   Therapist Response: Assessed pt's current functioning and reviewed progress.. Assisted pt processing stressors, negative thoughts.  Assisted pt processing for the management of her stressors.   Participation Level: Active   Diagnosis:  PTSD     Follow Up Instructions: I discussed the assessment and treatment plan with the patient. The patient was provided an opportunity to ask questions and all were answered. The patient agreed with the plan and demonstrated an understanding of the instructions.   The patient was advised to call back or seek an in-person evaluation if the symptoms worsen or if the condition fails to improve as anticipated.   I provided 45 minutes of non-face-to-face time during this encounter.     Jachai Okazaki S, LCAS

## 2021-05-22 ENCOUNTER — Other Ambulatory Visit: Payer: Self-pay

## 2021-05-22 ENCOUNTER — Encounter (HOSPITAL_COMMUNITY): Payer: Self-pay | Admitting: Licensed Clinical Social Worker

## 2021-05-22 ENCOUNTER — Ambulatory Visit (INDEPENDENT_AMBULATORY_CARE_PROVIDER_SITE_OTHER): Payer: No Typology Code available for payment source | Admitting: Licensed Clinical Social Worker

## 2021-05-22 DIAGNOSIS — F4310 Post-traumatic stress disorder, unspecified: Secondary | ICD-10-CM | POA: Diagnosis not present

## 2021-05-22 NOTE — Progress Notes (Signed)
Virtual Visit via Phone Note   I connected with [employer] [place] on 05/16/21 at 9:00 am EST by a phone-enabled telemedicine application and verified that I am speaking with the correct person using two identifiers.   I discussed the limitations of evaluation and management by telemedicine and the availability of in person appointments. The patient expressed understanding and agreed to proceed.   LOCATION: Patient: Home Provider: Home Office   History of Present Illness: Pt was referred to therapy by the [facility] for PTSD.    Participation Level: Active     Type of Therapy: Virtual Video individual therapy   Treatment Goals addressed: Improve psychiatric symptoms, Controlled Behavior, Moderated Mood, Improve Unhelpful Thought Patterns, Emotional Regulation Skills (Moderate moods, anger management, stress management), Feel and express a full Range of Emotions, Learn about Diagnosis, Healthy Coping Skills.   Interventions: CBT/Supportive/Psychoeducation   Summary: Patient presented for todays session on time and was alert, oriented x5, with no evidence or self-report of SI/HI or A/V H.  Patient reported ongoing compliance with medication.  Clinician inquired about patients current emotional ratings, as well as any significant changes in thoughts, feelings or behavior since previous session. Patient's emotional ratings 5/10 for depression, 5/10 for anxiety, 4/10 for anger/irritability. Pt reports on his moods this week and coping skills. Pt talked about his relationship issues. Pt shared on-going communication issues. [name] provided education on effective communication skills. [name] and pt role-played effective communication skills, asking for appointment and listening skills.     PLAN: disability paperwork at the [place] office in [place]: relationship with daughter/granddaughters (communication), thinking before talking,        Assessment and plan: Counselor will continue to  meet with patient to address treatment plan goals. Patient will continue to follow recommendations of providers and implement skills learned in session.   Suicidal/Homicidal: Nowithout intent/plan   Therapist Response: Assessed pt's current functioning and reviewed progress.. Assisted pt processing stressors, negative thoughts.  Assisted pt processing for the management of her stressors.   Participation Level: Active   Diagnosis:  PTSD     Follow Up Instructions: I discussed the assessment and treatment plan with the patient. The patient was provided an opportunity to ask questions and all were answered. The patient agreed with the plan and demonstrated an understanding of the instructions.   The patient was advised to call back or seek an in-person evaluation if the symptoms worsen or if the condition fails to improve as anticipated.   I provided 45 minutes of non-face-to-face time during this encounter.     Ermal Haberer S, LCAS

## 2021-05-28 ENCOUNTER — Encounter (HOSPITAL_COMMUNITY): Payer: Self-pay | Admitting: Licensed Clinical Social Worker

## 2021-05-28 ENCOUNTER — Ambulatory Visit (INDEPENDENT_AMBULATORY_CARE_PROVIDER_SITE_OTHER): Payer: No Typology Code available for payment source | Admitting: Licensed Clinical Social Worker

## 2021-05-28 ENCOUNTER — Other Ambulatory Visit: Payer: Self-pay

## 2021-05-28 DIAGNOSIS — F4310 Post-traumatic stress disorder, unspecified: Secondary | ICD-10-CM | POA: Diagnosis not present

## 2021-05-28 NOTE — Progress Notes (Signed)
Virtual Visit via Phone Note   I connected with [employer] Jackson [name] on 05/29/21 at 3:00 pm EST by a phone-enabled telemedicine application and verified that I am speaking with the correct person using two identifiers.   I discussed the limitations of evaluation and management by telemedicine and the availability of in person appointments. The patient expressed understanding and agreed to proceed.   LOCATION: Patient: Home Provider: Home Office   History of Present Illness: Pt was referred to therapy by the [facility] for PTSD.    Participation Level: Active     Type of Therapy: Virtual Video individual therapy   Treatment Goals addressed: Improve psychiatric symptoms, Controlled Behavior, Moderated Mood, Improve Unhelpful Thought Patterns, Emotional Regulation Skills (Moderate moods, anger management, stress management), Feel and express a full Range of Emotions, Learn about Diagnosis, Healthy Coping Skills.   Interventions: CBT/Supportive/Psychoeducation   Summary: Patient presented for todays session on time and was alert, oriented x5, with no evidence or self-report of SI/HI or A/V H.  Patient reported ongoing compliance with medication.  Clinician inquired about patients current emotional ratings, as well as any significant changes in thoughts, feelings or behavior since previous session. Patient'[name] emotional ratings 5/10 for depression, 5/10 for anxiety, 4/10 for anger/irritability. Pt reports on his moods this week and coping skills. Pt went to the [place] today due to kidney stones, and was placed on antibiotics. Pt'[name] BP was high today at the [place]. [name] suggested pt make appt with his PCP at the [place] to discuss his BP, get a BP cuff from the [place]. Pt and his daughter are having problems communicating. [name] provided education on effective communication skills, role played communication skills.   PLAN: disability paperwork at the [place] office in [place]: relationship with  daughter/granddaughters (communication), thinking before talking,        Assessment and plan: Counselor will continue to meet with patient to address treatment plan goals. Patient will continue to follow recommendations of providers and implement skills learned in session.   Suicidal/Homicidal: Nowithout intent/plan   Therapist Response: Assessed pt'[name] current functioning and reviewed progress.. Assisted pt processing stressors, negative thoughts.  Assisted pt processing for the management of her stressors.   Participation Level: Active   Diagnosis:  PTSD     Follow Up Instructions: I discussed the assessment and treatment plan with the patient. The patient was provided an opportunity to ask questions and all were answered. The patient agreed with the plan and demonstrated an understanding of the instructions.   The patient was advised to call back or seek an in-person evaluation if the symptoms worsen or if the condition fails to improve as anticipated.   I provided 45 minutes of non-face-to-face time during this encounter.     Jackson [name], LCAS

## 2021-05-29 ENCOUNTER — Telehealth (HOSPITAL_COMMUNITY): Payer: Self-pay | Admitting: Licensed Clinical Social Worker

## 2021-05-29 NOTE — Telephone Encounter (Signed)
05/29/2021 3:44pm  Called the patient because he want the front desk to remove his [employer] because he states that he doesn't want [facility] to bill the [employer] just [employer] only for his visits, the patient also stated that [place] is primary and Medicare is secondary.  Will send an email to [employer] to get an answer - explained to patient that I will call back.Marland KitchenMariana Kaufman

## 2021-05-30 ENCOUNTER — Telehealth (HOSPITAL_COMMUNITY): Payer: Self-pay | Admitting: Licensed Clinical Social Worker

## 2021-05-30 NOTE — Telephone Encounter (Signed)
05/30/21 2:26pm  Called the patient to informed that I am still waiting for an answer about his insurance.  Patient stated he understands.Marland KitchenMariana Kaufman

## 2021-06-04 ENCOUNTER — Ambulatory Visit (INDEPENDENT_AMBULATORY_CARE_PROVIDER_SITE_OTHER): Payer: No Typology Code available for payment source | Admitting: Licensed Clinical Social Worker

## 2021-06-04 ENCOUNTER — Encounter (HOSPITAL_COMMUNITY): Payer: Self-pay | Admitting: Licensed Clinical Social Worker

## 2021-06-04 ENCOUNTER — Ambulatory Visit (HOSPITAL_COMMUNITY): Payer: Medicare PPO | Admitting: Licensed Clinical Social Worker

## 2021-06-04 ENCOUNTER — Telehealth (HOSPITAL_COMMUNITY): Payer: Self-pay | Admitting: Licensed Clinical Social Worker

## 2021-06-04 ENCOUNTER — Other Ambulatory Visit: Payer: Self-pay

## 2021-06-04 DIAGNOSIS — F4310 Post-traumatic stress disorder, unspecified: Secondary | ICD-10-CM

## 2021-06-04 NOTE — Progress Notes (Signed)
Virtual Visit via Phone Note   I connected with [employer] [place] on 06/04/21 at 11:00 am EST by a phone-enabled telemedicine application and verified that I am speaking with the correct person using two identifiers.   I discussed the limitations of evaluation and management by telemedicine and the availability of in person appointments. The patient expressed understanding and agreed to proceed.   LOCATION: Patient: Home Provider: Home Office   History of Present Illness: Pt was referred to therapy by the [facility] for PTSD.    Participation Level: Active     Type of Therapy: Virtual Video individual therapy   Treatment Goals addressed: Improve psychiatric symptoms, Controlled Behavior, Moderated Mood, Improve Unhelpful Thought Patterns, Emotional Regulation Skills (Moderate moods, anger management, stress management), Feel and express a full Range of Emotions, Learn about Diagnosis, Healthy Coping Skills.   Interventions: CBT/Supportive/Psychoeducation   Summary: Patient presented for todays session on time and was alert, oriented x5, with no evidence or self-report of SI/HI or A/V H.  Patient reported ongoing compliance with medication.  Clinician inquired about patients current emotional ratings, as well as any significant changes in thoughts, feelings or behavior since previous session. Patient's emotional ratings 5/10 for depression, 5/10 for anxiety, 4/10 for anger/irritability. Pt reports on his moods this week and coping skills.  Pt reports he continues to struggle with low frustration tolerance, "getting angry and frustrated with things I don't have control over." [name] used CBT to assist patient in increasing his frustration tolerance. Pt has been putting himself into frustration-causing scenarios, which increased his frustration intolerance. [name] role played increasing frustration tolerance.  Pt reports his bp has been fluctuating. [name] suggested pt contact his PCP at the  [place].  PLAN: disability paperwork at the [place] office in [place]: relationship with daughter/granddaughters (communication), thinking before talking,        Assessment and plan: Counselor will continue to meet with patient to address treatment plan goals. Patient will continue to follow recommendations of providers and implement skills learned in session.   Suicidal/Homicidal: Nowithout intent/plan   Therapist Response: Assessed pt's current functioning and reviewed progress.. Assisted pt processing stressors, negative thoughts.  Assisted pt processing for the management of her stressors.   Participation Level: Active   Diagnosis:  PTSD     Follow Up Instructions: I discussed the assessment and treatment plan with the patient. The patient was provided an opportunity to ask questions and all were answered. The patient agreed with the plan and demonstrated an understanding of the instructions.   The patient was advised to call back or seek an in-person evaluation if the symptoms worsen or if the condition fails to improve as anticipated.   I provided 45 minutes of non-face-to-face time during this encounter.     Sera Hitsman S, LCAS

## 2021-06-10 ENCOUNTER — Encounter (HOSPITAL_COMMUNITY): Payer: Self-pay | Admitting: Licensed Clinical Social Worker

## 2021-06-10 ENCOUNTER — Other Ambulatory Visit: Payer: Self-pay

## 2021-06-10 ENCOUNTER — Ambulatory Visit (INDEPENDENT_AMBULATORY_CARE_PROVIDER_SITE_OTHER): Payer: No Typology Code available for payment source | Admitting: Licensed Clinical Social Worker

## 2021-06-10 DIAGNOSIS — F4310 Post-traumatic stress disorder, unspecified: Secondary | ICD-10-CM

## 2021-06-10 NOTE — Progress Notes (Signed)
Virtual Visit via Phone Note   I connected with [employer] Jackson [name] on 06/10/21 at 11:00 am EST by a phone-enabled telemedicine application and verified that I am speaking with the correct person using two identifiers.   I discussed the limitations of evaluation and management by telemedicine and the availability of in person appointments. The patient expressed understanding and agreed to proceed.   LOCATION: Patient: Home Provider: Home Office   History of Present Illness: Pt was referred to therapy by the [facility] for PTSD.    Participation Level: Active     Type of Therapy: Virtual Video individual therapy   Treatment Goals addressed: Improve psychiatric symptoms, Controlled Behavior, Moderated Mood, Improve Unhelpful Thought Patterns, Emotional Regulation Skills (Moderate moods, anger management, stress management), Feel and express a full Range of Emotions, Learn about Diagnosis, Healthy Coping Skills.   Interventions: CBT/Supportive/Psychoeducation   Summary: Patient presented for todays session on time and was alert, oriented x5, with no evidence or self-report of SI/HI or A/V H.  Patient reported ongoing compliance with medication.  Clinician inquired about patients current emotional ratings, as well as any significant changes in thoughts, feelings or behavior since previous session. Patient's emotional ratings 5/10 for depression, 5/10 for anxiety, 4/10 for anger/irritability. Pt reports on his moods this week and coping skills. Pt reports he's having some health issues which are increasing his emotional ratings. "My bp continues to rise, I have kidney stones." [name] and pt explored next steps to address his health issues at the [place]. "I don't feel heard by my providers at the [place]." [name] and pt explored which provider to contact to ask pertinent questions. [name] and pt explored effective communication skills to ask for what he needs. [name] and pt role played contacting his providers, asking  for what he needs to address his health concerns. [name] briefly reviewed his low frustration tolerance, and how to increase it.   PLAN: disability paperwork at the [place] office in [place]: relationship with daughter/granddaughters (communication), thinking before talking,        Assessment and plan: Counselor will continue to meet with patient to address treatment plan goals. Patient will continue to follow recommendations of providers and implement skills learned in session.   Suicidal/Homicidal: Nowithout intent/plan   Therapist Response: Assessed pt's current functioning and reviewed progress.. Assisted pt processing stressors, negative thoughts.  Assisted pt processing for the management of her stressors.   Participation Level: Active   Diagnosis:  PTSD     Follow Up Instructions: I discussed the assessment and treatment plan with the patient. The patient was provided an opportunity to ask questions and all were answered. The patient agreed with the plan and demonstrated an understanding of the instructions.   The patient was advised to call back or seek an in-person evaluation if the symptoms worsen or if the condition fails to improve as anticipated.   I provided 45 minutes of non-face-to-face time during this encounter.     Jelesa Mangini S, LCAS

## 2021-06-17 ENCOUNTER — Ambulatory Visit (INDEPENDENT_AMBULATORY_CARE_PROVIDER_SITE_OTHER): Payer: No Typology Code available for payment source | Admitting: Licensed Clinical Social Worker

## 2021-06-17 ENCOUNTER — Encounter (HOSPITAL_COMMUNITY): Payer: Self-pay | Admitting: Licensed Clinical Social Worker

## 2021-06-17 ENCOUNTER — Other Ambulatory Visit: Payer: Self-pay

## 2021-06-17 DIAGNOSIS — F4310 Post-traumatic stress disorder, unspecified: Secondary | ICD-10-CM | POA: Diagnosis not present

## 2021-06-17 NOTE — Progress Notes (Signed)
Virtual Visit via Phone Note   I connected with [employer] [place] on 06/10/21 at 11:00 am EST by a phone-enabled telemedicine application and verified that I am speaking with the correct person using two identifiers.   I discussed the limitations of evaluation and management by telemedicine and the availability of in person appointments. The patient expressed understanding and agreed to proceed.   LOCATION: Patient: Home Provider: Home Office   History of Present Illness: Pt was referred to therapy by the [facility] for PTSD.    Participation Level: Active     Type of Therapy: Virtual Video individual therapy   Treatment Goals addressed: Improve psychiatric symptoms, Controlled Behavior, Moderated Mood, Improve Unhelpful Thought Patterns, Emotional Regulation Skills (Moderate moods, anger management, stress management), Feel and express a full Range of Emotions, Learn about Diagnosis, Healthy Coping Skills.   Interventions: CBT/Supportive/Psychoeducation   Summary: Patient presented for todays session on time and was alert, oriented x5, with no evidence or self-report of SI/HI or A/V H.  Patient reported ongoing compliance with medication.  Clinician inquired about patients current emotional ratings, as well as any significant changes in thoughts, feelings or behavior since previous session. Patient's emotional ratings 5/10 for depression, 5/10 for anxiety, 4/10 for anger/irritability. Pt reports on his moods this week and coping skills. Pt reports he's having some health issues which are increasing his emotional ratings. "My bp continues to rise. I went to the [place] saw a psychiatrist who placed him on Remron and Cymbalta. PCP placed him on BP meds, "but they're not working." [name] suggested he call the [place] about is continued rise in BP. Pt reports he has been doing better with his low frustration tolerance and using him communication skills. [name] provided education on his new psychiatric  meds.     PLAN: disability paperwork at the [place] office in [place]: relationship with daughter/granddaughters (communication), thinking before talking,        Assessment and plan: Counselor will continue to meet with patient to address treatment plan goals. Patient will continue to follow recommendations of providers and implement skills learned in session.   Suicidal/Homicidal: Nowithout intent/plan   Therapist Response: Assessed pt's current functioning and reviewed progress.. Assisted pt processing stressors, negative thoughts.  Assisted pt processing for the management of her stressors.   Participation Level: Active   Diagnosis:  PTSD     Follow Up Instructions: I discussed the assessment and treatment plan with the patient. The patient was provided an opportunity to ask questions and all were answered. The patient agreed with the plan and demonstrated an understanding of the instructions.   The patient was advised to call back or seek an in-person evaluation if the symptoms worsen or if the condition fails to improve as anticipated.   I provided 45 minutes of non-face-to-face time during this encounter.     Durk Carmen S, LCAS

## 2021-06-24 ENCOUNTER — Ambulatory Visit (INDEPENDENT_AMBULATORY_CARE_PROVIDER_SITE_OTHER): Payer: No Typology Code available for payment source | Admitting: Licensed Clinical Social Worker

## 2021-06-24 ENCOUNTER — Other Ambulatory Visit: Payer: Self-pay

## 2021-06-24 ENCOUNTER — Encounter (HOSPITAL_COMMUNITY): Payer: Self-pay | Admitting: Licensed Clinical Social Worker

## 2021-06-24 DIAGNOSIS — F4310 Post-traumatic stress disorder, unspecified: Secondary | ICD-10-CM | POA: Diagnosis not present

## 2021-06-24 NOTE — Progress Notes (Signed)
Virtual Visit via Phone Note   I connected with [employer] [place] on 06/24/21 at 11:00 am EST by a phone-enabled telemedicine application and verified that I am speaking with the correct person using two identifiers.   I discussed the limitations of evaluation and management by telemedicine and the availability of in person appointments. The patient expressed understanding and agreed to proceed.   LOCATION: Patient: Home Provider: Home Office   History of Present Illness: Pt was referred to therapy by the [facility] for PTSD.    Participation Level: Active     Type of Therapy: Virtual Video individual therapy   Treatment Goals addressed: Improve psychiatric symptoms, Controlled Behavior, Moderated Mood, Improve Unhelpful Thought Patterns, Emotional Regulation Skills (Moderate moods, anger management, stress management), Feel and express a full Range of Emotions, Learn about Diagnosis, Healthy Coping Skills.   Interventions: CBT/Supportive/Psychoeducation   Summary: Patient presented for todays session on time and was alert, oriented x5, with no evidence or self-report of SI/HI or A/V H.  Patient reported ongoing compliance with medication.  Clinician inquired about patients current emotional ratings, as well as any significant changes in thoughts, feelings or behavior since previous session. Patient's emotional ratings 5/10 for depression, 5/10 for anxiety, 4/10 for anger/irritability. Pt reports on his moods this week and coping skills. Pt reports he's having some health issues which are increasing his emotional ratings. "My bp continues to rise. I went to the [place] clinic and saw a dr virtually and the changed his bp medication and made a dr appt next week. [name] and pt explored alternatives for pt if his bp increases to dangerous levels (ED, Urgent Care, [place] [facility], EMS). PT agreed he would follow through with suggestions. Pt reports he has been doing better with his low frustration tolerance  and using him communication skills.   Plan: Remeron,  Cymbalta   PLAN: disability paperwork at the [place] office in [place]: relationship with daughter/granddaughters (communication), thinking before talking,        Assessment and plan: Counselor will continue to meet with patient to address treatment plan goals. Patient will continue to follow recommendations of providers and implement skills learned in session.   Suicidal/Homicidal: Nowithout intent/plan   Therapist Response: Assessed pt's current functioning and reviewed progress.. Assisted pt processing stressors, negative thoughts.  Assisted pt processing for the management of her stressors.   Participation Level: Active   Diagnosis:  PTSD     Follow Up Instructions: I discussed the assessment and treatment plan with the patient. The patient was provided an opportunity to ask questions and all were answered. The patient agreed with the plan and demonstrated an understanding of the instructions.   The patient was advised to call back or seek an in-person evaluation if the symptoms worsen or if the condition fails to improve as anticipated.   I provided 45 minutes of non-face-to-face time during this encounter.     Justyn Boyson S, LCAS

## 2021-07-01 ENCOUNTER — Ambulatory Visit (INDEPENDENT_AMBULATORY_CARE_PROVIDER_SITE_OTHER): Payer: No Typology Code available for payment source | Admitting: Licensed Clinical Social Worker

## 2021-07-01 ENCOUNTER — Encounter (HOSPITAL_COMMUNITY): Payer: Self-pay | Admitting: Licensed Clinical Social Worker

## 2021-07-01 ENCOUNTER — Other Ambulatory Visit: Payer: Self-pay

## 2021-07-01 DIAGNOSIS — F4310 Post-traumatic stress disorder, unspecified: Secondary | ICD-10-CM

## 2021-07-01 NOTE — Progress Notes (Signed)
Virtual Visit via Phone Note   I connected with [employer] [place] on 07/01/21 at 11:00 am EST by a phone-enabled telemedicine application and verified that I am speaking with the correct person using two identifiers.   I discussed the limitations of evaluation and management by telemedicine and the availability of in person appointments. The patient expressed understanding and agreed to proceed.   LOCATION: Patient: Home Provider: Home Office   History of Present Illness: Pt was referred to therapy by the [facility] for PTSD.    Participation Level: Active     Type of Therapy: Virtual Video individual therapy   Treatment Goals addressed: Improve psychiatric symptoms, Controlled Behavior, Moderated Mood, Improve Unhelpful Thought Patterns, Emotional Regulation Skills (Moderate moods, anger management, stress management), Feel and express a full Range of Emotions, Learn about Diagnosis, Healthy Coping Skills.   Interventions: CBT/Supportive/Psychoeducation   Summary: Patient presented for todays session on time and was alert, oriented x5, with no evidence or self-report of SI/HI or A/V H.  Patient reported ongoing compliance with medication.  Clinician inquired about patients current emotional ratings, as well as any significant changes in thoughts, feelings or behavior since previous session. Patient's emotional ratings 5/10 for depression, 5/10 for anxiety, 4/10 for anger/irritability. Pt reports on his moods this week and coping skills. Pt reports he's having some health issues which are increasing his emotional ratings. Pt reports he went to the [place] about his bp last week, but it was WNL while at the [place]. Pt is still having sleep issues. [name] provided education on sleep hygiene. Pt continues to have racing thoughts in the night. Remeron should be helping with that. [name] suggested pt speak to his psychiatrist about the medication. Pt reported he has backed away from some of the [place]  groups he was a part of due to the stress, anxiety and anger it was causing him. [name] asked open ended questions.      Plan: Remeron,  Cymbalta   PLAN: disability paperwork at the [place] office in [place]: relationship with daughter/granddaughters (communication), thinking before talking,        Assessment and plan: Counselor will continue to meet with patient to address treatment plan goals. Patient will continue to follow recommendations of providers and implement skills learned in session.   Suicidal/Homicidal: Nowithout intent/plan   Therapist Response: Assessed pt's current functioning and reviewed progress.. Assisted pt processing stressors, negative thoughts.  Assisted pt processing for the management of her stressors.   Participation Level: Active   Diagnosis:  PTSD     Follow Up Instructions: I discussed the assessment and treatment plan with the patient. The patient was provided an opportunity to ask questions and all were answered. The patient agreed with the plan and demonstrated an understanding of the instructions.   The patient was advised to call back or seek an in-person evaluation if the symptoms worsen or if the condition fails to improve as anticipated.   I provided 45 minutes of non-face-to-face time during this encounter.     Trenell Moxey S, LCAS

## 2021-07-08 ENCOUNTER — Encounter (HOSPITAL_COMMUNITY): Payer: Self-pay | Admitting: Licensed Clinical Social Worker

## 2021-07-08 ENCOUNTER — Other Ambulatory Visit: Payer: Self-pay

## 2021-07-08 ENCOUNTER — Ambulatory Visit (INDEPENDENT_AMBULATORY_CARE_PROVIDER_SITE_OTHER): Payer: No Typology Code available for payment source | Admitting: Licensed Clinical Social Worker

## 2021-07-08 DIAGNOSIS — F4310 Post-traumatic stress disorder, unspecified: Secondary | ICD-10-CM

## 2021-07-08 NOTE — Progress Notes (Signed)
Virtual Visit via Phone Note   I connected with [employer] Jackson [name] on 07/08/21 at 11:00 am EST by a phone-enabled telemedicine application and verified that I am speaking with the correct person using two identifiers.   I discussed the limitations of evaluation and management by telemedicine and the availability of in person appointments. The patient expressed understanding and agreed to proceed.   LOCATION: Patient: Home Provider: Home Office   History of Present Illness: Pt was referred to therapy by the [facility] for PTSD.    Participation Level: Active     Type of Therapy: Virtual Video individual therapy   Treatment Goals addressed: Improve psychiatric symptoms, Controlled Behavior, Moderated Mood, Improve Unhelpful Thought Patterns, Emotional Regulation Skills (Moderate moods, anger management, stress management), Feel and express a full Range of Emotions, Learn about Diagnosis, Healthy Coping Skills.   Interventions: CBT/Supportive/Psychoeducation   Summary: Patient presented for todays session on time and was alert, oriented x5, with no evidence or self-report of SI/HI or A/V H.  Patient reported ongoing compliance with medication.  Clinician inquired about patients current emotional ratings, as well as any significant changes in thoughts, feelings or behavior since previous session. Patient'[name] emotional ratings 6/10 for depression, 5/10 for anxiety, 4/10 for anger/irritability. Pt reports on his moods this week and coping skills. Pt reports he'[name] experienced several veteran deaths within the past week. [name] asked open-ended questions. [name] provided education on the stages of grief. 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 [name] asked open ended questions. Pt reports on his continued bp issues. [facility] to work with him on BP cuff compliance. Pt reports he has used the sleep hygiene suggestions but he is still  not getting but a few hours sleep nightly. [name] suggested pt reach out to dr who prescribed remeron.   Collaboration of Care: Primary Care Provider [place] PCP for medication issues and Psychiatrist [facility] for [facility]sleep issues  Patient/Guardian was advised Release of Information must be obtained prior to any record release in order to collaborate their care with an outside provider. Patient/Guardian was advised if they have not already done so to contact the registration department to sign all necessary forms in order for [place] to release information regarding their care.   Consent: Patient/Guardian gives verbal consent for treatment and assignment of benefits for services provided during this visit. Patient/Guardian expressed understanding and agreed to proceed.      Plan: Remeron,  Cymbalta   PLAN: disability paperwork at the [place] office in [place]: relationship with daughter/granddaughters (communication), thinking before talking,        Assessment and plan: Counselor will continue to meet with patient to address treatment plan goals. Patient will continue to follow recommendations of providers and implement skills learned in session.   Suicidal/Homicidal: Nowithout intent/plan   Therapist Response: Assessed pt'[name] current functioning and reviewed progress.. Assisted pt processing stressors, negative thoughts.  Assisted pt processing for the management of her stressors.   Participation Level: Active   Diagnosis:  PTSD     Follow Up Instructions: I discussed the assessment and treatment plan with the patient. The patient was provided an opportunity to ask questions and all were answered. The patient agreed with the plan and demonstrated an understanding of the instructions.   The patient was advised to call back or seek an in-person evaluation if the symptoms worsen or if the condition fails to improve  as anticipated.   I provided 45 minutes of non-face-to-face  time during this encounter.     Jackson [name], LCAS

## 2021-07-15 ENCOUNTER — Other Ambulatory Visit: Payer: Self-pay

## 2021-07-15 ENCOUNTER — Encounter (HOSPITAL_COMMUNITY): Payer: Self-pay | Admitting: Licensed Clinical Social Worker

## 2021-07-15 ENCOUNTER — Ambulatory Visit (INDEPENDENT_AMBULATORY_CARE_PROVIDER_SITE_OTHER): Payer: No Typology Code available for payment source | Admitting: Licensed Clinical Social Worker

## 2021-07-15 DIAGNOSIS — F4310 Post-traumatic stress disorder, unspecified: Secondary | ICD-10-CM

## 2021-07-15 NOTE — Progress Notes (Signed)
Virtual Visit via Phone Note   I connected with [employer] [place] on 07/15/21 at 11:00 am EST by a phone-enabled telemedicine application and verified that I am speaking with the correct person using two identifiers.   I discussed the limitations of evaluation and management by telemedicine and the availability of in person appointments. The patient expressed understanding and agreed to proceed.   LOCATION: Patient: Home Provider: Home Office   History of Present Illness: Pt was referred to therapy by the [facility] for PTSD.    Participation Level: Active     Type of Therapy: Virtual Video individual therapy   Treatment Goal addressed: [place] will experience a 20% reduction in exaggerated beliefs about self and other that interfere with trauma AEB by self report.  Progression towards Goal: Progressing   Interventions: CBT/Supportive/Psychoeducation   Summary: Patient presented for todays session on time and was alert, oriented x5, with no evidence or self-report of SI/HI or A/V H.  Patient reported ongoing compliance with medication.  Clinician inquired about patients current emotional ratings, as well as any significant changes in thoughts, feelings or behavior since previous session. Patient's emotional ratings 6/10 for depression, 5/10 for anxiety, 4/10 for anger/irritability, 10% reduction in exaggerated beliefs of self and other veterans AEB self report. Pt reports on his moods this week and coping skills AEB by self report. Patient reports he has an appointment Wednesday for education on BP cuff. [name] suggested pt "to keep an open mind and listen to the RN." Pt agreed to listen. [name] and pt reviewed his Veteran groups he's a part of and pt was able talk about his belief of veterans in general. Clinician utilized [place] OARS to affirm concerns.   Collaboration of Care: No collaboration of care needed at this session  Patient/Guardian was advised Release of Information must be obtained  prior to any record release in order to collaborate their care with an outside provider. Patient/Guardian was advised if they have not already done so to contact the registration department to sign all necessary forms in order for [place] to release information regarding their care.   Consent: Patient/Guardian gives verbal consent for treatment and assignment of benefits for services provided during this visit. Patient/Guardian expressed understanding and agreed to proceed.      Plan: Remeron,  Cymbalta   PLAN: disability paperwork at the [place] office in [place]: relationship with daughter/granddaughters (communication), thinking before talking,        Assessment and plan: Counselor will continue to meet with patient to address treatment plan goals. Patient will continue to follow recommendations of providers and implement skills learned in session.   Suicidal/Homicidal: Nowithout intent/plan   Therapist Response: Assessed pt's current functioning and reviewed progress.. Assisted pt processing stressors, negative thoughts.  Assisted pt processing for the management of her stressors.   Participation Level: Active   Diagnosis:  PTSD     Follow Up Instructions: I discussed the assessment and treatment plan with the patient. The patient was provided an opportunity to ask questions and all were answered. The patient agreed with the plan and demonstrated an understanding of the instructions.   The patient was advised to call back or seek an in-person evaluation if the symptoms worsen or if the condition fails to improve as anticipated.   I provided 45 minutes of non-face-to-face time during this encounter.     Coda Mathey S, LCAS

## 2021-07-22 ENCOUNTER — Other Ambulatory Visit: Payer: Self-pay

## 2021-07-22 ENCOUNTER — Ambulatory Visit (HOSPITAL_COMMUNITY): Payer: No Typology Code available for payment source | Admitting: Licensed Clinical Social Worker

## 2021-07-22 ENCOUNTER — Ambulatory Visit (INDEPENDENT_AMBULATORY_CARE_PROVIDER_SITE_OTHER): Payer: No Typology Code available for payment source | Admitting: Licensed Clinical Social Worker

## 2021-07-22 DIAGNOSIS — F4310 Post-traumatic stress disorder, unspecified: Secondary | ICD-10-CM

## 2021-07-29 ENCOUNTER — Other Ambulatory Visit: Payer: Self-pay

## 2021-07-29 ENCOUNTER — Encounter (HOSPITAL_COMMUNITY): Payer: Self-pay | Admitting: Licensed Clinical Social Worker

## 2021-07-29 ENCOUNTER — Ambulatory Visit (INDEPENDENT_AMBULATORY_CARE_PROVIDER_SITE_OTHER): Payer: No Typology Code available for payment source | Admitting: Licensed Clinical Social Worker

## 2021-07-29 DIAGNOSIS — F4310 Post-traumatic stress disorder, unspecified: Secondary | ICD-10-CM

## 2021-07-29 NOTE — Progress Notes (Signed)
Virtual Visit via Phone Note ?  ?I connected with Jackson Guise. [name] on 07/22/21 at 11:00 am EST by a phone-enabled telemedicine application and verified that I am speaking with the correct person using two identifiers. ?  ?I discussed the limitations of evaluation and management by telemedicine and the availability of in person appointments. The patient expressed understanding and agreed to proceed. ?  ?LOCATION: ?Patient: Home ?Provider: Home Office ?  ?History of Present Illness: Pt was referred to therapy by the [facility] for PTSD.  ?  ?Participation Level: Active ?  ?  ?Type of Therapy: Virtual Video individual therapy ?  ?Treatment Goal addressed: Wen will experience a 20% reduction in exaggerated beliefs about self and other that interfere with trauma AEB by self report. ? ?Progression towards Goal: Progressing ?  ?Interventions: CBT/Supportive/Psychoeducation ?  ?Summary: Patient presented for today?s session on time and was alert, oriented x5, with no evidence or self-report of SI/HI or A/V H.  Patient reported ongoing compliance with medication.  Clinician inquired about patient?s current emotional ratings, as well as any significant changes in thoughts, feelings or behavior since previous session. Patient's emotional ratings 6/10 for depression, 5/10 for anxiety, 4/10 for anger/irritability, 10% reduction in exaggerated beliefs of self and others that interfere with trauma, AEB self report. Pt reports on his moods this week and coping skills AEB by self report. Patient reports he's having more back pain, "I fell out the window."  ?[name] suggested pt go to his back dr to be checked out and pain relief.Clinician utilized CBT to process thoughts, feelings, and behaviors. Clinician noted the challenge in being productive and feeling positive when physical health is poor. Clinician provided supportive feedback. ? ? ?Collaboration of Care: [name] suggested pt return to his back dr  (orthopedic). ? ?Patient/Guardian was advised Release of Information must be obtained prior to any record release in order to collaborate their care with an outside provider. Patient/Guardian was advised if they have not already done so to contact the registration department to sign all necessary forms in order for [place] to release information regarding their care.  ? ?Consent: Patient/Guardian gives verbal consent for treatment and assignment of benefits for services provided during this visit. Patient/Guardian expressed understanding and agreed to proceed.  ? ?  ? ?Plan: Remeron,  Cymbalta ? ? ?PLAN: disability paperwork at the [place] office in [place] ? ?Plan: relationship with daughter/granddaughters (communication), thinking before talking,  ?  ?  ?  ?Assessment and plan: Counselor will continue to meet with patient to address treatment plan goals. Patient will continue to follow recommendations of providers and implement skills learned in session. ?  ?Suicidal/Homicidal: Nowithout intent/plan ?  ?Therapist Response: Assessed pt's current functioning and reviewed progress.. Assisted pt processing stressors, negative thoughts.  Assisted pt processing for the management of her stressors. ?  ?Participation Level: Active ?  ?Diagnosis:  PTSD ?  ?  ?Follow Up Instructions: I discussed the assessment and treatment plan with the patient. The patient was provided an opportunity to ask questions and all were answered. The patient agreed with the plan and demonstrated an understanding of the instructions. ?  ?The patient was advised to call back or seek an in-person evaluation if the symptoms worsen or if the condition fails to improve as anticipated. ?  ?I provided 45 minutes of non-face-to-face time during this encounter. ?  ?  ?Agatha Duplechain S, LCAS ?  ?  ?

## 2021-07-31 ENCOUNTER — Encounter (HOSPITAL_COMMUNITY): Payer: Self-pay | Admitting: Licensed Clinical Social Worker

## 2021-07-31 NOTE — Progress Notes (Signed)
Virtual Visit via Phone Note ?  ?I connected with Jackson [name] on 07/29/21 at 11:00 am EST by a phone-enabled telemedicine application and verified that I am speaking with the correct person using two identifiers. ?  ?I discussed the limitations of evaluation and management by telemedicine and the availability of in person appointments. The patient expressed understanding and agreed to proceed. ?  ?LOCATION: ?Patient: Home ?Provider: Home Office ?  ?History of Present Illness: Pt was referred to therapy by the [facility] for PTSD.  ?  ?Participation Level: Active ?  ?  ?Type of Therapy: Virtual ndividual therapy ?  ?Treatment Goal addressed: Jackson [name] will experience a 20% reduction in exaggerated beliefs about self and other that interfere with trauma AEB by self report. ? ?Progression towards Goal: Progressing ?  ?Interventions: CBT/Supportive/Psychoeducation ?  ?Summary: Patient presented for today?[name] session on time and was alert, oriented x5, with no evidence or self-report of SI/HI or A/V H.  Patient reported ongoing compliance with medication.  Clinician inquired about patient?[name] current emotional ratings, as well as any significant changes in thoughts, feelings or behavior since previous session. Patient'[name] emotional ratings 6/10 for depression, 6/10 for anxiety, 5/10 for anger/irritability, 10% reduction in exaggerated beliefs of self and others that interfere with trauma, AEB self report. Pt reports on his moods this week and coping skills AEB by self report. 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 "I'm sad, have more depressive symptoms but I could just be grieving." [name] provided education on the grief process. Pt provided an update on his back: more injections, so I'm hopeful. Clinician utilized [place] OARS to affirm his concerns. ? ?Collaboration of Care: No collaboration of care was needed at this session ? ?Patient/Guardian was advised Release of Information  must be obtained prior to any record release in order to collaborate their care with an outside provider. Patient/Guardian was advised if they have not already done so to contact the registration department to sign all necessary forms in order for [place] to release information regarding their care.  ? ?Consent: Patient/Guardian gives verbal consent for treatment and assignment of benefits for services provided during this visit. Patient/Guardian expressed understanding and agreed to proceed.  ? ?  ? ?Plan: Remeron,  Cymbalta ? ? ?PLAN: disability paperwork at the [place] office in [place] ? ?Plan: relationship with daughter/granddaughters (communication), thinking before talking,  ?  ?  ?  ?Assessment and plan: Counselor will continue to meet with patient to address treatment plan goals. Patient will continue to follow recommendations of providers and implement skills learned in session. ?  ?Suicidal/Homicidal: Nowithout intent/plan ?  ?Therapist Response: Assessed pt'[name] current functioning and reviewed progress.. Assisted pt processing stressors, negative thoughts.  Assisted pt processing for the management of her stressors. ?  ?Participation Level: Active ?  ?Diagnosis:  PTSD ?  ?  ?Follow Up Instructions: I discussed the assessment and treatment plan with the patient. The patient was provided an opportunity to ask questions and all were answered. The patient agreed with the plan and demonstrated an understanding of the instructions. ?  ?The patient was advised to call back or seek an in-person evaluation if the symptoms worsen or if the condition fails to improve as anticipated. ?  ?I provided 45 minutes of non-face-to-face time during this encounter. ?  ?  ?Jackson [name], LCAS ?  ?  ?

## 2021-08-05 ENCOUNTER — Other Ambulatory Visit: Payer: Self-pay

## 2021-08-05 ENCOUNTER — Ambulatory Visit (HOSPITAL_COMMUNITY): Payer: No Typology Code available for payment source | Admitting: Licensed Clinical Social Worker

## 2021-08-05 ENCOUNTER — Ambulatory Visit (INDEPENDENT_AMBULATORY_CARE_PROVIDER_SITE_OTHER): Payer: No Typology Code available for payment source | Admitting: Licensed Clinical Social Worker

## 2021-08-05 DIAGNOSIS — F4310 Post-traumatic stress disorder, unspecified: Secondary | ICD-10-CM | POA: Diagnosis not present

## 2021-08-06 ENCOUNTER — Encounter (HOSPITAL_COMMUNITY): Payer: Self-pay | Admitting: Licensed Clinical Social Worker

## 2021-08-06 NOTE — Progress Notes (Signed)
Virtual Visit via Phone Note ?  ?I connected with Jackson [name] on 08/05/21 at 3:00 pm by a phone-enabled telemedicine application and verified that I am speaking with the correct person using two identifiers. ?  ?I discussed the limitations of evaluation and management by telemedicine and the availability of in person appointments. The patient expressed understanding and agreed to proceed. ?  ?LOCATION: ?Patient: Home ?Provider: Home Office ?  ?History of Present Illness: Pt was referred to therapy by the [facility] for PTSD.  ?  ?Participation Level: Active ?  ?  ?Type of Therapy: Virtual ndividual therapy ?  ?Treatment Goal addressed: [place] will experience a 20% reduction in exaggerated beliefs about self and other that interfere with trauma AEB by self report. ? ?Progression towards Goal: Progressing ?  ?Interventions: CBT/Supportive/Psychoeducation ?  ?Summary: Patient presented for today?s session on time and was alert, oriented x5, with no evidence or self-report of SI/HI or A/V H.  Patient reported ongoing compliance with medication.  Clinician inquired about patient?s current emotional ratings, as well as any significant changes in thoughts, feelings or behavior since previous session. Patient's emotional ratings 6/10 for depression, 6/10 for anxiety, 5/10 for anger/irritability, 10% reduction in exaggerated beliefs of self and others that interfere with trauma, AEB self report. Pt reports on his moods this week and coping skills AEB by self report. Patient reports he's having problems communicating with family recently. I've been using my communication skills." [name] provided education on all communication skills. ? ? ?Collaboration of Care: No collaboration of care was needed at this session ? ?Patient/Guardian was advised Release of Information must be obtained prior to any record release in order to collaborate their care with an outside provider. Patient/Guardian was advised if they have not  already done so to contact the registration department to sign all necessary forms in order for [place] to release information regarding their care.  ? ?Consent: Patient/Guardian gives verbal consent for treatment and assignment of benefits for services provided during this visit. Patient/Guardian expressed understanding and agreed to proceed.  ? ?  ? ?Plan: Remeron,  Cymbalta ? ? ?PLAN: disability paperwork at the [place] office in [place] ? ?Plan: relationship with daughter/granddaughters (communication), thinking before talking,  ?  ?  ?  ?Assessment and plan: Counselor will continue to meet with patient to address treatment plan goals. Patient will continue to follow recommendations of providers and implement skills learned in session. ?  ?Suicidal/Homicidal: Nowithout intent/plan ?  ?Therapist Response: Assessed pt's current functioning and reviewed progress.. Assisted pt processing stressors, negative thoughts.  Assisted pt processing for the management of her stressors. ?  ?Participation Level: Active ?  ?Diagnosis:  PTSD ?  ?  ?Follow Up Instructions: I discussed the assessment and treatment plan with the patient. The patient was provided an opportunity to ask questions and all were answered. The patient agreed with the plan and demonstrated an understanding of the instructions. ?  ?The patient was advised to call back or seek an in-person evaluation if the symptoms worsen or if the condition fails to improve as anticipated. ?  ?I provided 45 minutes of non-face-to-face time during this encounter. ?  ?  ?Brentlee Sciara S, LCAS ?  ?  ?

## 2021-08-12 ENCOUNTER — Other Ambulatory Visit: Payer: Self-pay

## 2021-08-12 ENCOUNTER — Encounter (HOSPITAL_COMMUNITY): Payer: Self-pay | Admitting: Licensed Clinical Social Worker

## 2021-08-12 ENCOUNTER — Ambulatory Visit (INDEPENDENT_AMBULATORY_CARE_PROVIDER_SITE_OTHER): Payer: No Typology Code available for payment source | Admitting: Licensed Clinical Social Worker

## 2021-08-12 DIAGNOSIS — F4310 Post-traumatic stress disorder, unspecified: Secondary | ICD-10-CM

## 2021-08-12 NOTE — Progress Notes (Signed)
Virtual Visit via Phone Note ?  ?I connected with Jackson [name]. Jackson [name] on 08/12/21 at 3:00 pm by a phone-enabled telemedicine application and verified that I am speaking with the correct person using two identifiers. ?  ?I discussed the limitations of evaluation and management by telemedicine and the availability of in person appointments. The patient expressed understanding and agreed to proceed. ?  ?LOCATION: ?Patient: Home ?Provider: Home Office ?  ?History of Present Illness: Pt was referred to therapy by the [facility] for PTSD.  ?  ?Participation Level: Active ?  ?  ?Type of Therapy: Virtual ndividual therapy ?  ?Treatment Goal addressed: Arles will experience a 20% reduction in exaggerated beliefs about self and other that interfere with trauma AEB by self report. ? ?Progression towards Goal: Progressing ?  ?Interventions: CBT/Supportive/Psychoeducation ?  ?Summary: Patient presented for today?s session on time and was alert, oriented x5, with no evidence or self-report of SI/HI or A/V H.  Patient reported ongoing compliance with medication.  Clinician inquired about patient?s current emotional ratings, as well as any significant changes in thoughts, feelings or behavior since previous session. Patient's emotional ratings 6/10 for depression, 6/10 for anxiety, 5/10 for anger/irritability, 10% reduction in exaggerated beliefs of self and others that interfere with trauma, AEB self report. Pt reports on his moods this week and coping skills AEB by self report. "Pt reports his nightmares have returned which is interfering with his sleep. Also, my anxiety has increased a lot." [name] suggested pt contact psychiatry to let them know about his increase in symptoms." ? ? ? ?Collaboration of Care: Other: [name] suggested pt contact psychiatry at the [facility] to discuss increase in symptoms. ? ?Patient/Guardian was advised Release of Information must be obtained prior to any record release in order to collaborate their care with  an outside provider. Patient/Guardian was advised if they have not already done so to contact the registration department to sign all necessary forms in order for [place] to release information regarding their care.  ? ?Consent: Patient/Guardian gives verbal consent for treatment and assignment of benefits for services provided during this visit. Patient/Guardian expressed understanding and agreed to proceed.  ? ?  ? ?Plan: Remeron,  Cymbalta ? ? ?PLAN: disability paperwork at the [place] office in [place] ? ?Plan: relationship with daughter/granddaughters (communication), thinking before talking,  ?  ?  ?  ?Assessment and plan: Counselor will continue to meet with patient to address treatment plan goals. Patient will continue to follow recommendations of providers and implement skills learned in session. ?  ?Suicidal/Homicidal: Nowithout intent/plan ?  ?Therapist Response: Assessed pt's current functioning and reviewed progress.. Assisted pt processing stressors, negative thoughts.  Assisted pt processing for the management of her stressors. ?  ?Participation Level: Active ?  ?Diagnosis:  PTSD ?  ?  ?Follow Up Instructions: I discussed the assessment and treatment plan with the patient. The patient was provided an opportunity to ask questions and all were answered. The patient agreed with the plan and demonstrated an understanding of the instructions. ?  ?The patient was advised to call back or seek an in-person evaluation if the symptoms worsen or if the condition fails to improve as anticipated. ?  ?I provided 45 minutes of non-face-to-face time during this encounter. ?  ?  ?Daniela Siebers S, LCAS ?  ?  ?

## 2021-08-19 ENCOUNTER — Ambulatory Visit (INDEPENDENT_AMBULATORY_CARE_PROVIDER_SITE_OTHER): Payer: No Typology Code available for payment source | Admitting: Licensed Clinical Social Worker

## 2021-08-19 DIAGNOSIS — F4310 Post-traumatic stress disorder, unspecified: Secondary | ICD-10-CM

## 2021-08-20 ENCOUNTER — Encounter (HOSPITAL_COMMUNITY): Payer: Self-pay | Admitting: Licensed Clinical Social Worker

## 2021-08-20 NOTE — Progress Notes (Signed)
Virtual Visit via Phone Note ?  ?I connected with Jackson [name] on 08/19/21 at 3:00 pm by a phone-enabled telemedicine application and verified that I am speaking with the correct person using two identifiers. ?  ?I discussed the limitations of evaluation and management by telemedicine and the availability of in person appointments. The patient expressed understanding and agreed to proceed. ?  ?LOCATION: ?Patient: Home ?Provider: Home Office ?  ?History of Present Illness: Pt was referred to therapy by the [facility] for PTSD.  ?  ?Participation Level: Active ?  ?  ?Type of Therapy: Virtual ndividual therapy ?  ?Treatment Goal addressed: Muhammadali will experience a 20% reduction in exaggerated beliefs about self and other that interfere with trauma AEB by self report. ? ?Progression towards Goal: Progressing ?  ?Interventions: CBT/Supportive/Psychoeducation ?  ?Summary: Patient presented for today?s session on time and was alert, oriented x5, with no evidence or self-report of SI/HI or A/V H.  Patient reported ongoing compliance with medication.  Clinician inquired about patient?s current emotional ratings, as well as any significant changes in thoughts, feelings or behavior since previous session. Patient's emotional ratings 6/10 for depression, 6/10 for anxiety, 5/10 for anger/irritability, 10% reduction in exaggerated beliefs of self and others that interfere with trauma, AEB self report. Pt reports on his moods this week and coping skills AEB by self report. Pt reports on his veteran activities within the past week. "I used my communication skills, didn't get angry or frustrated. I mainly listened." Clinician utilized MI OARS to reflect and summarize his thoughts and feelings about his choices in participating in veteran activities. ? ?Collaboration of Care: Other: None needed at this session ? ?Patient/Guardian was advised Release of Information must be obtained prior to any record release in order to  collaborate their care with an outside provider. Patient/Guardian was advised if they have not already done so to contact the registration department to sign all necessary forms in order for [place] to release information regarding their care.  ? ?Consent: Patient/Guardian gives verbal consent for treatment and assignment of benefits for services provided during this visit. Patient/Guardian expressed understanding and agreed to proceed.  ? ?  ? ?Plan: Remeron,  Cymbalta, nightmares? ? ? ?PLAN: disability paperwork at the [place] office in [place] ? ? ?  ?  ?  ?Assessment and plan: Counselor will continue to meet with patient to address treatment plan goals. Patient will continue to follow recommendations of providers and implement skills learned in session. ?  ?Suicidal/Homicidal: Nowithout intent/plan ?  ?Therapist Response: Assessed pt's current functioning and reviewed progress.. Assisted pt processing stressors, negative thoughts.  Assisted pt processing for the management of her stressors. ?  ?Participation Level: Active ?  ?Diagnosis:  PTSD ?  ?  ?Follow Up Instructions: I discussed the assessment and treatment plan with the patient. The patient was provided an opportunity to ask questions and all were answered. The patient agreed with the plan and demonstrated an understanding of the instructions. ?  ?The patient was advised to call back or seek an in-person evaluation if the symptoms worsen or if the condition fails to improve as anticipated. ?  ?I provided 45 minutes of non-face-to-face time during this encounter. ?  ?  ?Azrielle Springsteen S, LCAS ?  ?  ?

## 2021-08-26 ENCOUNTER — Ambulatory Visit (HOSPITAL_COMMUNITY): Payer: No Typology Code available for payment source | Admitting: Licensed Clinical Social Worker

## 2021-08-27 ENCOUNTER — Ambulatory Visit (INDEPENDENT_AMBULATORY_CARE_PROVIDER_SITE_OTHER): Payer: No Typology Code available for payment source | Admitting: Licensed Clinical Social Worker

## 2021-08-27 ENCOUNTER — Encounter (HOSPITAL_COMMUNITY): Payer: Self-pay | Admitting: Licensed Clinical Social Worker

## 2021-08-27 DIAGNOSIS — F4310 Post-traumatic stress disorder, unspecified: Secondary | ICD-10-CM | POA: Diagnosis not present

## 2021-08-27 NOTE — Progress Notes (Signed)
Virtual Visit via Phone Note ?  ?I connected with Jackson [name]. Valliant on 08/27/21 at 3:00 pm by a phone-enabled telemedicine application and verified that I am speaking with the correct person using two identifiers. ?  ?I discussed the limitations of evaluation and management by telemedicine and the availability of in person appointments. The patient expressed understanding and agreed to proceed. ?  ?LOCATION: ?Patient: Home ?Provider: Home Office ?  ?History of Present Illness: Pt was referred to therapy by the [facility] for PTSD.  ?  ?Participation Level: Active ?  ?  ?Type of Therapy: Virtual ndividual therapy ?  ?Treatment Goal addressed: [name] will experience a 20% reduction in exaggerated beliefs about self and other that interfere with trauma AEB by self report. ? ?Progression towards Goal: Progressing ?  ?Interventions: CBT/Supportive/Psychoeducation ?  ?Summary: Patient presented for today?s session on time and was alert, oriented x5, with no evidence or self-report of SI/HI or A/V H.  Patient reported ongoing compliance with medication.  Clinician inquired about patient?s current emotional ratings, as well as any significant changes in thoughts, feelings or behavior since previous session. Patient's emotional ratings 6/10 for depression, 6/10 for anxiety, 5/10 for anger/irritability, 10% reduction in exaggerated beliefs of self and others that interfere with trauma, AEB self report. Pt reports on his moods this week and coping skills AEB by self report. Pt reports on his veteran activities within the past week. 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 I feel good when I help other veterans."  [name] asked open-ended questions. Pt reports he went to the [place] yesterday for pre-surgery testing for cataracts. Clinician utilized CBT to process thoughts, feelings, and behaviors. Clinician noted the challenge in being productive, volunteering, and feeling positive when physical health  is poor. Clinician provided supportive feedback. 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 ? ? ?Collaboration of Care: Other: None needed at this session ? ?Patient/Guardian was advised Release of Information must be obtained prior to any record release in order to collaborate their care with an outside provider. Patient/Guardian was advised if they have not alreay done so to contact the registration department to sign all necessary forms in order for [place] to release information regarding their care.  ? ?Consent: Patient/Guardian gives verbal consent for treatment and assignment of benefits for services provided during this visit. Patient/Guardian expressed understanding and agreed to proceed.  ? ?  ? ?Plan: Remeron,  Cymbalta, nightmares? ? ? ?PLAN: disability paperwork at the [place] office in [place] ? ? ?  ?  ?  ?Assessment and plan: Counselor will continue to meet with patient to address treatment plan goals. Patient will continue to follow recommendations of providers and implement skills learned in session. ?  ?Suicidal/Homicidal: Nowithout intent/plan ?  ?Therapist Response: Assessed pt's current functioning and reviewed progress.. Assisted pt processing stressors, negative thoughts.  Assisted pt processing for the management of her stressors. ?  ?Participation Level: Active ?  ?Diagnosis:  PTSD ?  ?  ?Follow Up Instructions: I discussed the assessment and treatment plan with the patient. The patient was provided an opportunity to ask questions and all were answered. The patient agreed with the plan and demonstrated an understanding of the instructions. ?  ?The patient was advised to call back or seek an in-person evaluation if the symptoms worsen or if the condition fails to improve as anticipated. ?  ?I provided 45 minutes of non-face-to-face time during this encounter. ?  ?  ?  Estefany Goebel S, LCAS ?  ?  ?

## 2021-09-02 ENCOUNTER — Encounter (HOSPITAL_COMMUNITY): Payer: Self-pay | Admitting: Licensed Clinical Social Worker

## 2021-09-02 ENCOUNTER — Ambulatory Visit (INDEPENDENT_AMBULATORY_CARE_PROVIDER_SITE_OTHER): Payer: No Typology Code available for payment source | Admitting: Licensed Clinical Social Worker

## 2021-09-02 DIAGNOSIS — F4310 Post-traumatic stress disorder, unspecified: Secondary | ICD-10-CM

## 2021-09-02 NOTE — Progress Notes (Signed)
Virtual Visit via Phone Note ?  ?I connected with Jackson [name]. Binion on 09/02/21 at 11:00 am by a phone-enabled telemedicine application and verified that I am speaking with the correct person using two identifiers. ?  ?I discussed the limitations of evaluation and management by telemedicine and the availability of in person appointments. The patient expressed understanding and agreed to proceed. ?  ?LOCATION: ?Patient: Home ?Provider: Home Office ?  ?History of Present Illness: Jackson [name] was referred to therapy by the [facility] for PTSD.  ?  ?Participation Level: Active ?  ?  ?Type of Therapy: Virtual Individual therapy ?  ?Treatment Goal addressed: Thos will experience a 20% reduction in exaggerated beliefs about self and other that interfere with trauma AEB by self report. ? ?Progression towards Goal: Progressing ?  ?Interventions: CBT/Supportive/Psychoeducation ?  ?Summary: Patient presented for today?s session on time and was alert, oriented x5, with no evidence or self-report of SI/HI or A/V H.  Patient reported ongoing compliance with medication.  Clinician inquired about patient?s current emotional ratings, as well as any significant changes in thoughts, feelings or behavior since previous session. Patient's emotional ratings 6/10 for depression, 6/10 for anxiety, 6/10 for anger/irritability, 10% reduction in exaggerated beliefs of self and others that interfere with trauma, AEB self report. Jackson [name] reports on his moods this week and coping skills AEB by self report. Jackson [name] reports on his veteran activities within the past week. [name] asked open-ended questions. "I'm agitated." Jackson [name] is experiencing low frustration tolerance. [name] provided education on increasing low frustration tolerance. "I bought a truck. I was tired of asking for transportation of others." "I'm feeling overwhelmed due to 5 dr appts this week." [name] and Jackson [name] reviewed each dr visit and goal of each appointment. Clinician utilized MI OARS to affirm  concerns. ? ? ? ? ?Collaboration of Care: Other: None needed at this session ? ?Patient/Guardian was advised Release of Information must be obtained prior to any record release in order to collaborate their care with an outside provider. Patient/Guardian was advised if they have not alreay done so to contact the registration department to sign all necessary forms in order for [place] to release information regarding their care.  ? ?Consent: Patient/Guardian gives verbal consent for treatment and assignment of benefits for services provided during this visit. Patient/Guardian expressed understanding and agreed to proceed.  ? ?  ? ?Plan: Remeron,  Cymbalta, nightmares? ? ? ?PLAN: disability paperwork at the [place] office in [place] ? ? ?  ?  ?  ?Assessment and plan: Counselor will continue to meet with patient to address treatment plan goals. Patient will continue to follow recommendations of providers and implement skills learned in session. ?  ?Suicidal/Homicidal: Nowithout intent/plan ?  ?Therapist Response: Assessed Jackson [name]'s current functioning and reviewed progress.. Assisted Jackson [name] processing stressors, negative thoughts.  Assisted Jackson [name] processing for the management of her stressors. ?  ?Participation Level: Active ?  ?Diagnosis:  PTSD ?  ?  ?Follow Up Instructions: I discussed the assessment and treatment plan with the patient. The patient was provided an opportunity to ask questions and all were answered. The patient agreed with the plan and demonstrated an understanding of the instructions. ?  ?The patient was advised to call back or seek an in-person evaluation if the symptoms worsen or if the condition fails to improve as anticipated. ?  ?I provided 45 minutes of non-face-to-face time during this encounter. ?  ?  ?Chivonne Rascon S, LCAS ?  ?  ?

## 2021-09-09 ENCOUNTER — Ambulatory Visit (INDEPENDENT_AMBULATORY_CARE_PROVIDER_SITE_OTHER): Payer: No Typology Code available for payment source | Admitting: Licensed Clinical Social Worker

## 2021-09-09 ENCOUNTER — Encounter (HOSPITAL_COMMUNITY): Payer: Self-pay | Admitting: Licensed Clinical Social Worker

## 2021-09-09 DIAGNOSIS — F4310 Post-traumatic stress disorder, unspecified: Secondary | ICD-10-CM

## 2021-09-09 NOTE — Progress Notes (Signed)
Virtual Visit via Video Note ?  ?I connected with Jackson [name] on 09/09/21 at 11:00 am by a video-enabled telemedicine application and verified that I am speaking with the correct person using two identifiers. ?  ?I discussed the limitations of evaluation and management by telemedicine and the availability of in person appointments. The patient expressed understanding and agreed to proceed. ?  ?LOCATION: ?Patient: Home ?Provider: Home Office ?  ?History of Present Illness: Pt was referred to therapy by the [facility] for PTSD.  ?  ?Participation Level: Active ?  ?  ?Type of Therapy: Virtual Individual therapy ?  ?Treatment Goal addressed: Jackson [name] will experience a 20% reduction in exaggerated beliefs about self and other that interfere with trauma AEB by self report. ? ?Progression towards Goal: Progressing ?  ?Interventions: CBT/Supportive/Psychoeducation ?  ?Summary: Patient presented for today?s session on time and was alert, oriented x5, with no evidence or self-report of SI/HI or A/V H.  Patient reported ongoing compliance with medication.  Clinician inquired about patient?s current emotional ratings, as well as any significant changes in thoughts, feelings or behavior since previous session. Patient's emotional ratings 6/10 for depression, 6/10 for anxiety, 4/10 for anger/irritability, 10% reduction in exaggerated beliefs of self and others that interfere with trauma, AEB self report. Pt reports on his moods this week and coping skills AEB by self report. Pt reports on his dr visits at the [place] last week: Cataracts & hearing aid. [name] asked open-ended questions. [name] provided an update on his veteran activities, family, health, lady friend, eating habits. Pt reports he participated in veteran activities, eating healthier, communication errors with lady friend, upcoming cataract surgery, getting used to hearing aid. Pt reports on his agitation, and low frustration tolerance. Pt reports "My daughter and  granddaughter came over last week to set up "my chart" so I can now do video visits. It was a good visit to see my family." Clinician utilized MI OARS to reflect and summarize thoughts and feelings. ? ? ? ? ?Collaboration of Care: Other: None needed at this session ? ?Patient/Guardian was advised Release of Information must be obtained prior to any record release in order to collaborate their care with an outside provider. Patient/Guardian was advised if they have not alreay done so to contact the registration department to sign all necessary forms in order for [place] to release information regarding their care.  ? ?Consent: Patient/Guardian gives verbal consent for treatment and assignment of benefits for services provided during this visit. Patient/Guardian expressed understanding and agreed to proceed.  ? ?  ? ?Plan: Remeron,  Cymbalta, nightmares? ? ? ?PLAN: disability paperwork at the [place] office in [place] ? ? ?  ?  ?  ?Assessment and plan: Counselor will continue to meet with patient to address treatment plan goals. Patient will continue to follow recommendations of providers and implement skills learned in session. ?  ?Suicidal/Homicidal: Nowithout intent/plan ?  ?Therapist Response: Assessed pt's current functioning and reviewed progress.. Assisted pt processing stressors, negative thoughts.  Assisted pt processing for the management of her stressors. ?  ?Participation Level: Active ?  ?Diagnosis:  PTSD ?  ?  ?Follow Up Instructions: I discussed the assessment and treatment plan with the patient. The patient was provided an opportunity to ask questions and all were answered. The patient agreed with the plan and demonstrated an understanding of the instructions. ?  ?The patient was advised to call back or seek an in-person evaluation if the symptoms worsen or if  the condition fails to improve as anticipated. ?  ?I provided 45 minutes of non-face-to-face time during this encounter. ?  ?  ?Cabell Lazenby S,  LCAS ?  ?  ?

## 2021-09-16 ENCOUNTER — Ambulatory Visit (INDEPENDENT_AMBULATORY_CARE_PROVIDER_SITE_OTHER): Payer: No Typology Code available for payment source | Admitting: Licensed Clinical Social Worker

## 2021-09-16 ENCOUNTER — Encounter (HOSPITAL_COMMUNITY): Payer: Self-pay | Admitting: Licensed Clinical Social Worker

## 2021-09-16 DIAGNOSIS — F4310 Post-traumatic stress disorder, unspecified: Secondary | ICD-10-CM

## 2021-09-16 NOTE — Progress Notes (Signed)
Virtual Visit via Video Note ?  ?I connected with Jackson Guise. Colden on 09/16/21 at 11:00 am by a video-enabled telemedicine application and verified that I am speaking with the correct person using two identifiers. ?  ?I discussed the limitations of evaluation and management by telemedicine and the availability of in person appointments. The patient expressed understanding and agreed to proceed. ?  ?LOCATION: ?Patient: Home ?Provider: Home Office ?  ?History of Present Illness: Pt was referred to therapy by the [facility] for PTSD.  ?  ?Participation Level: Active ?  ?  ?Type of Therapy: Virtual Individual therapy ?  ?Treatment Goal addressed: Pt will eliminate his maladaptive behaviors and thinking patterns which interfere with resolution of trauma by using Cognitive Restructuring 3/5 times per week AEB self-report. ?Progression towards Goal: Progressing ?  ?Interventions: CBT/Supportive/Psychoeducation ?  ?Summary: Patient presented for today?s session on time and was alert, oriented x5, with no evidence or self-report of SI/HI or A/V H.  Patient reported ongoing compliance with medication.  Clinician inquired about patient?s current emotional ratings, as well as any significant changes in thoughts, feelings or behavior since previous session. Patient's emotional ratings 6/10 for depression, 6/10 for anxiety, 4/10 for anger/irritability. Pt reports on his moods this week and coping skills AEB by self report. Pt reports on his dr visits and his cataract surgery this week at the [place], hearing aid. Pt reported on his plans for his surgery (transportation, meals, care). Pt reports on his maladaptive behaviors and thinking patterns, which interferes with his trauma symptoms. [name] provided education on cognitive restructuring.Pt provided updates: veteran activities, eating healthier,  upcoming cataract surgery, getting used to hearing aid. 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 [name] will share this with other veterans. Pt is frustrated with the VA/disability. [name] gave pt information on [facility] phone #. ? ? ? ? ?Collaboration of Care: Other: None needed at this session ? ?Patient/Guardian was advised Release of Information must be obtained prior to any record release in order to collaborate their care with an outside provider. Patient/Guardian was advised if they have not alreay done so to contact the registration department to sign all necessary forms in order for [place] to release information regarding their care.  ? ?Consent: Patient/Guardian gives verbal consent for treatment and assignment of benefits for services provided during this visit. Patient/Guardian expressed understanding and agreed to proceed.  ? ?  ? ?Plan: Remeron,  Cymbalta, nightmares? ? ? ?PLAN: disability paperwork at the [place] office in [place] ? ? ?  ?  ?  ?Assessment and plan: Counselor will continue to meet with patient to address treatment plan goals. Patient will continue to follow recommendations of providers and implement skills learned in session. ?  ?Suicidal/Homicidal: Nowithout intent/plan ?  ?Therapist Response: Assessed pt's current functioning and reviewed progress.. Assisted pt processing stressors, negative thoughts.  Assisted pt processing for the management of her stressors. ?  ?Participation Level: Active ?  ?Diagnosis:  PTSD ?  ?  ?Follow Up Instructions: I discussed the assessment and treatment plan with the patient. The patient was provided an opportunity to ask questions and all were answered. The patient agreed with the plan and demonstrated an understanding of the instructions. ?  ?The patient was advised to call back or seek an in-person evaluation if the symptoms worsen or if the condition fails to improve as anticipated. ?  ?I provided 45 minutes of non-face-to-face  time during this encounter. ?  ?  ?Nesiah Jump S, LCAS ?  ?  ?

## 2021-09-23 ENCOUNTER — Encounter (HOSPITAL_COMMUNITY): Payer: Self-pay | Admitting: Licensed Clinical Social Worker

## 2021-09-23 ENCOUNTER — Ambulatory Visit (INDEPENDENT_AMBULATORY_CARE_PROVIDER_SITE_OTHER): Payer: No Typology Code available for payment source | Admitting: Licensed Clinical Social Worker

## 2021-09-23 DIAGNOSIS — F4310 Post-traumatic stress disorder, unspecified: Secondary | ICD-10-CM

## 2021-09-23 NOTE — Progress Notes (Signed)
Virtual Visit via Video Note ?  ?I connected with Jackson [name]. Jackson [name] on 09/23/21 at 11:00 am by a video-enabled telemedicine application and verified that I am speaking with the correct person using two identifiers. ?  ?I discussed the limitations of evaluation and management by telemedicine and the availability of in person appointments. The patient expressed understanding and agreed to proceed. ?  ?LOCATION: ?Patient: Home ?Provider: Home Office ?  ?History of Present Illness: Pt was referred to therapy by the [facility] for PTSD.  ?  ?Participation Level: Active ?  ?  ?Type of Therapy: Virtual Individual therapy ?  ?Treatment Goal addressed: Pt will eliminate his maladaptive behaviors and thinking patterns which interfere with resolution of trauma by using Cognitive Restructuring 3/5 times per week AEB self-report. ? ?Progression towards Goal: Progressing ?  ?Interventions: CBT/Supportive/Psychoeducation ?  ?Summary: Patient presented for today?s session on time and was alert, oriented x5, with no evidence or self-report of SI/HI or A/V H.  Patient reported ongoing compliance with medication.  Clinician inquired about patient?s current emotional ratings, as well as any significant changes in thoughts, feelings or behavior since previous session. Patient's emotional ratings 6/10 for depression, 6/10 for anxiety, 4/10 for anger/irritability. Pt reports on his moods this week and coping skills AEB by self report. Pt reports on his cataract surgery last week at the [place]. He had his brother and daughter provide transportation. Pt reports on his maladaptive behaviors and thinking patterns, which interferes with his trauma symptoms. Pt provided updates: veteran activities, eating healthier, cataract surgery, getting used to hearing aid, relationship, family.  ?Clinician validated importance of self-compassion and positive self talk. Clinician utilized CBT to process thoughts, feelings, and behaviors.  ? ?Collaboration  of Care: Other: None needed at this session.  ? ?Patient/Guardian was advised Release of Information must be obtained prior to any record release in order to collaborate their care with an outside provider. Patient/Guardian was advised if they have not alreay done so to contact the registration department to sign all necessary forms in order for [place] to release information regarding their care.  ? ?Consent: Patient/Guardian gives verbal consent for treatment and assignment of benefits for services provided during this visit. Patient/Guardian expressed understanding and agreed to proceed.  ? ?  ? ?Plan: Remeron,  Cymbalta, nightmares? ? ? ?PLAN: disability paperwork at the [place] office in [place] ? ? ?  ?  ?  ?Assessment and plan: Counselor will continue to meet with patient to address treatment plan goals. Patient will continue to follow recommendations of providers and implement skills learned in session. ?  ?Suicidal/Homicidal: Nowithout intent/plan ?  ?Therapist Response: Assessed pt's current functioning and reviewed progress.. Assisted pt processing stressors, negative thoughts.  Assisted pt processing for the management of her stressors. ?  ?Participation Level: Active ?  ?Diagnosis:  PTSD ?  ?  ?Follow Up Instructions: I discussed the assessment and treatment plan with the patient. The patient was provided an opportunity to ask questions and all were answered. The patient agreed with the plan and demonstrated an understanding of the instructions. ?  ?The patient was advised to call back or seek an in-person evaluation if the symptoms worsen or if the condition fails to improve as anticipated. ?  ?I provided 45 minutes of non-face-to-face time during this encounter. ?  ?  ?Shontae Rosiles S, LCAS ?  ?  ?

## 2021-09-30 ENCOUNTER — Ambulatory Visit (INDEPENDENT_AMBULATORY_CARE_PROVIDER_SITE_OTHER): Payer: No Typology Code available for payment source | Admitting: Licensed Clinical Social Worker

## 2021-09-30 ENCOUNTER — Encounter (HOSPITAL_COMMUNITY): Payer: Self-pay | Admitting: Licensed Clinical Social Worker

## 2021-09-30 DIAGNOSIS — F4310 Post-traumatic stress disorder, unspecified: Secondary | ICD-10-CM | POA: Diagnosis not present

## 2021-09-30 NOTE — Progress Notes (Signed)
Virtual Visit via Phone Note ?  ?I connected with Jackson [name]. Loya on 09/30/21 at 11:00 am by a phone-enabled telemedicine application and verified that I am speaking with the correct person using two identifiers. ?  ?I discussed the limitations of evaluation and management by telemedicine and the availability of in person appointments. The patient expressed understanding and agreed to proceed. ?  ?LOCATION: ?Patient: Home ?Provider: Home Office ?  ?History of Present Illness: Pt was referred to therapy by the [facility] for PTSD.  ?  ?Participation Level: Active ?  ?  ?Type of Therapy: Virtual Individual therapy ?  ?Treatment Goal addressed: Pt will eliminate his maladaptive behaviors and thinking patterns which interfere with resolution of trauma by Cognitive Restructuring 3/5 X per week AEB self-report. ? ?Progression towards Goal: Progressing ?  ?Interventions: CBT/Supportive/Psychoeducation ?  ?Summary: Patient presented for today?s session on time and was alert, oriented x5, with no evidence or self-report of SI/HI or A/V H.  Patient reported ongoing compliance with medication.  Clinician inquired about patient?s current emotional ratings, as well as any significant changes in thoughts, feelings or behavior since previous session. Patient's emotional ratings 6/10 for depression, 6/10 for anxiety, 4/10 for anger/irritability. Pt reports on his moods this week and coping skills AEB by self report. Pt reports he is meeting with [facility] tomorrow to schedule the next cataract surgery at the [place]. Pt reports on his maladaptive behaviors and thinking patterns, which interfered with his trauma symptoms by using the 4 part process of cognitive structuring. Pt provided updates: veteran activities, eating healthier, relationship, family. Clinician utilized CBT to process thoughts, feelings, and behaviors.  ? ?Collaboration of Care: Other: Contact attorney to help with VA disability paperwork ? ?Patient/Guardian was  advised Release of Information must be obtained prior to any record release in order to collaborate their care with an outside provider. Patient/Guardian was advised if they have not alreay done so to contact the registration department to sign all necessary forms in order for [place] to release information regarding their care.  ? ?Consent: Patient/Guardian gives verbal consent for treatment and assignment of benefits for services provided during this visit. Patient/Guardian expressed understanding and agreed to proceed.  ? ?  ? ?Plan: nightmares? BP  ? ? ?PLAN: disability paperwork? Attorney? ? ? ?  ?  ?  ?Assessment and plan: Counselor will continue to meet with patient to address treatment plan goals. Patient will continue to follow recommendations of providers and implement skills learned in session. ?  ?Suicidal/Homicidal: Nowithout intent/plan ?  ?Therapist Response: Assessed pt's current functioning and reviewed progress.. Assisted pt processing stressors, negative thoughts.  Assisted pt processing for the management of her stressors. ?  ?Participation Level: Active ?  ?Diagnosis:  PTSD ?  ?  ?Follow Up Instructions: I discussed the assessment and treatment plan with the patient. The patient was provided an opportunity to ask questions and all were answered. The patient agreed with the plan and demonstrated an understanding of the instructions. ?  ?The patient was advised to call back or seek an in-person evaluation if the symptoms worsen or if the condition fails to improve as anticipated. ?  ?I provided 45 minutes of non-face-to-face time during this encounter. ?  ?  ?Rennie Rouch S, LCAS ?  ?  ?

## 2021-10-07 ENCOUNTER — Ambulatory Visit (INDEPENDENT_AMBULATORY_CARE_PROVIDER_SITE_OTHER): Payer: No Typology Code available for payment source | Admitting: Licensed Clinical Social Worker

## 2021-10-07 ENCOUNTER — Encounter (HOSPITAL_COMMUNITY): Payer: Self-pay | Admitting: Licensed Clinical Social Worker

## 2021-10-07 DIAGNOSIS — F4310 Post-traumatic stress disorder, unspecified: Secondary | ICD-10-CM

## 2021-10-07 NOTE — Progress Notes (Signed)
Virtual Visit via Video Note   I connected with [employer] Schaab on 10/07/21 at 11:00 am by a video-enabled telemedicine application and verified that I am speaking with the correct person using two identifiers.   I discussed the limitations of evaluation and management by telemedicine and the availability of in person appointments. The patient expressed understanding and agreed to proceed.   LOCATION: Patient: Home Provider: Home Office   History of Present Illness: Pt was referred to therapy by the [facility] for PTSD.    Participation Level: Active     Type of Therapy: Virtual Individual therapy   Treatment Goal addressed: Pt will eliminate his maladaptive behaviors and thinking patterns which interfere with resolution of trauma by Cognitive Restructuring 3/5 X per week AEB self-report.  Progression towards Goal: Progressing   Interventions: CBT/Supportive/Psychoeducation   Summary: Patient presented for today's session on time and was alert, oriented x5, with no evidence or self-report of SI/HI or A/V H.  Patient reported ongoing compliance with medication.  Clinician inquired about patient's current emotional ratings, as well as any significant changes in thoughts, feelings or behavior since previous session. Patient's emotional ratings 4/10 for depression, 4/10 for anxiety, 3/10 for anger/irritability. Pt reports on his moods this week and coping skills AEB by self report. Pt reports on his medical providers at the [place]: upcoming cataract surgery, CT scan of kidneys (kidney stones), UTI, Hearing Aid adjustment. Clinician utilized MI OARS to affirm concerns. Pt reports on his maladaptive behaviors and thinking patterns, which interfered with his trauma symptoms which cause behavior patterns that are detrimental, counterproductive, or otherwise interfere with his optimal functioning.    Collaboration of Care: Other: Contact attorney to help with VA disability  paperwork  Patient/Guardian was advised Release of Information must be obtained prior to any record release in order to collaborate their care with an outside provider. Patient/Guardian was advised if they have not alreay done so to contact the registration department to sign all necessary forms in order for [place] to release information regarding their care.   Consent: Patient/Guardian gives verbal consent for treatment and assignment of benefits for services provided during this visit. Patient/Guardian expressed understanding and agreed to proceed.      Plan: nightmares? BP    PLAN: disability paperwork? Attorney?         Assessment and plan: Counselor will continue to meet with patient to address treatment plan goals. Patient will continue to follow recommendations of providers and implement skills learned in session.   Suicidal/Homicidal: Nowithout intent/plan   Therapist Response: Assessed pt's current functioning and reviewed progress.. Assisted pt processing stressors, negative thoughts.  Assisted pt processing for the management of her stressors.   Participation Level: Active   Diagnosis:  PTSD     Follow Up Instructions: I discussed the assessment and treatment plan with the patient. The patient was provided an opportunity to ask questions and all were answered. The patient agreed with the plan and demonstrated an understanding of the instructions.   The patient was advised to call back or seek an in-person evaluation if the symptoms worsen or if the condition fails to improve as anticipated.   I provided 45 minutes of non-face-to-face time during this encounter.     Kalden Wanke S, LCAS

## 2021-10-16 ENCOUNTER — Encounter (HOSPITAL_COMMUNITY): Payer: Self-pay | Admitting: Licensed Clinical Social Worker

## 2021-10-16 ENCOUNTER — Ambulatory Visit (INDEPENDENT_AMBULATORY_CARE_PROVIDER_SITE_OTHER): Payer: No Typology Code available for payment source | Admitting: Licensed Clinical Social Worker

## 2021-10-16 DIAGNOSIS — F4310 Post-traumatic stress disorder, unspecified: Secondary | ICD-10-CM

## 2021-10-16 NOTE — Progress Notes (Signed)
Virtual Visit via Video Note   I connected with [employer] Reimers on 10/16/21 at 11:00 am by a video-enabled telemedicine application and verified that I am speaking with the correct person using two identifiers.   I discussed the limitations of evaluation and management by telemedicine and the availability of in person appointments. The patient expressed understanding and agreed to proceed.   LOCATION: Patient: Home Provider: Home Office   History of Present Illness: Pt was referred to therapy by the [facility] for PTSD.    Participation Level: Active     Type of Therapy: Virtual Individual therapy   Treatment Goal addressed: Pt will eliminate his maladaptive behaviors and thinking patterns which interfere with resolution of trauma by Cognitive Restructuring 3/5 X per week AEB self-report.  Progression towards Goal: Progressing   Interventions: CBT/Supportive/Psychoeducation   Summary: Patient presented for today's session on time and was alert, oriented x5, with no evidence or self-report of SI/HI or A/V H.  Patient reported ongoing compliance with medication.  Clinician inquired about patient's current emotional ratings, as well as any significant changes in thoughts, feelings or behavior since previous session. Patient's emotional ratings 4/10 for depression, 4/10 for anxiety, 3/10 for anger/irritability. Pt reports on his moods this week and coping skills AEB by self report. Pt reports on his upcoming appointments at the [facility]: upcoming cataract surgery, CT scan of kidneys (kidney stones), UTI, Hearing Aid adjustment. Clinician utilized MI OARS to affirm concerns. Pt reports on his maladaptive behaviors and thinking patterns over the past week, which interfere with his trauma symptoms which cause behavior patterns that are detrimental, counterproductive, or otherwise interfere with his optimal functioning. 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 [name] asked open ended questions.    Collaboration of Care: Other: Contact attorney to help with VA disability paperwork  Patient/Guardian was advised Release of Information must be obtained prior to any record release in order to collaborate their care with an outside provider. Patient/Guardian was advised if they have not alreay done so to contact the registration department to sign all necessary forms in order for [place] to release information regarding their care.   Consent: Patient/Guardian gives verbal consent for treatment and assignment of benefits for services provided during this visit. Patient/Guardian expressed understanding and agreed to proceed.      Plan: nightmares? BP    PLAN: disability paperwork? Attorney?         Assessment and plan: Counselor will continue to meet with patient to address treatment plan goals. Patient will continue to follow recommendations of providers and implement skills learned in session.   Suicidal/Homicidal: Nowithout intent/plan   Therapist Response: Assessed pt's current functioning and reviewed progress.. Assisted pt processing stressors, negative thoughts.  Assisted pt processing for the management of her stressors.   Participation Level: Active   Diagnosis:  PTSD     Follow Up Instructions: I discussed the assessment and treatment plan with the patient. The patient was provided an opportunity to ask questions and all were answered. The patient agreed with the plan and demonstrated an understanding of the instructions.   The patient was advised to call back or seek an in-person evaluation if the symptoms worsen or if the condition fails to improve as anticipated.   I provided 45 minutes of non-face-to-face time during this encounter.     Keyan Folson S, LCAS

## 2021-10-21 ENCOUNTER — Encounter (HOSPITAL_COMMUNITY): Payer: Self-pay | Admitting: Licensed Clinical Social Worker

## 2021-10-21 ENCOUNTER — Ambulatory Visit (INDEPENDENT_AMBULATORY_CARE_PROVIDER_SITE_OTHER): Payer: No Typology Code available for payment source | Admitting: Licensed Clinical Social Worker

## 2021-10-21 DIAGNOSIS — F4310 Post-traumatic stress disorder, unspecified: Secondary | ICD-10-CM | POA: Diagnosis not present

## 2021-10-21 NOTE — Progress Notes (Addendum)
Virtual Visit via Phone Note   I connected with [employer] Feher on 10/21/21 at 11:00 am by a phone-enabled telemedicine application and verified that I am speaking with the correct person using two identifiers.   I discussed the limitations of evaluation and management by telemedicine and the availability of in person appointments. The patient expressed understanding and agreed to proceed.   LOCATION: Patient: Home Provider: Home Office   History of Present Illness: Pt was referred to therapy by the [facility] for PTSD.    Participation Level: Active     Type of Therapy: Virtual Individual therapy   Treatment Goal addressed: Pt will eliminate his maladaptive behaviors and thinking patterns which interfere with resolution of trauma by Cognitive Restructuring 3/5 X per week AEB self-report.  Progression towards Goal: Progressing   Interventions: CBT/Supportive/Psychoeducation   Summary: Patient presented for today's session on time and was alert, oriented x5, with no evidence or self-report of SI/HI or A/V H.  Patient reported ongoing compliance with medication.  Clinician inquired about patient's current emotional ratings, as well as any significant changes in thoughts, feelings or behavior since previous session. Patient's emotional ratings 4/10 for depression, 4/10 for anxiety, 3/10 for anger/irritability. Pt reports on his moods this week and coping skills AEB by self report. Pt reports on his upcoming appointments at the [place]: upcoming cataract surgery 6/14, CT scan of kidneys (kidney stones) 6/12, UTI, Hearing Aid adjustment, BP. Clinician utilized MI OARS to affirm concerns. Pt reports on his maladaptive behaviors and thinking patterns over the past week, which interfere with resolution of trauma symptoms 2 X within the past week. Pt reports on his veteran organizations over the past week  (motorcycle ride, [employer], [employer]). "Going to these meetings I see veterans that  we've lost over the past year." [name] again, encouraged pt to seek an attorney to help with his disability paperwork.    Collaboration of Care: Other: Contact attorney to help with VA disability paperwork  Patient/Guardian was advised Release of Information must be obtained prior to any record release in order to collaborate their care with an outside provider. Patient/Guardian was advised if they have not alreay done so to contact the registration department to sign all necessary forms in order for [place] to release information regarding their care.   Consent: Patient/Guardian gives verbal consent for treatment and assignment of benefits for services provided during this visit. Patient/Guardian expressed understanding and agreed to proceed.      Plan: nightmares? BP    PLAN: disability paperwork? Attorney?         Assessment and plan: Counselor will continue to meet with patient to address treatment plan goals. Patient will continue to follow recommendations of providers and implement skills learned in session.   Suicidal/Homicidal: Nowithout intent/plan   Therapist Response: Assessed pt's current functioning and reviewed progress.. Assisted pt processing stressors, negative thoughts.  Assisted pt processing for the management of her stressors.   Participation Level: Active   Diagnosis:  PTSD     Follow Up Instructions: I discussed the assessment and treatment plan with the patient. The patient was provided an opportunity to ask questions and all were answered. The patient agreed with the plan and demonstrated an understanding of the instructions.   The patient was advised to call back or seek an in-person evaluation if the symptoms worsen or if the condition fails to improve as anticipated.   I provided 45 minutes of non-face-to-face time during this encounter.  Jackson [name] S, LCAS

## 2021-10-28 ENCOUNTER — Ambulatory Visit (HOSPITAL_COMMUNITY): Payer: No Typology Code available for payment source | Admitting: Licensed Clinical Social Worker

## 2021-10-28 ENCOUNTER — Encounter (HOSPITAL_COMMUNITY): Payer: Self-pay | Admitting: Licensed Clinical Social Worker

## 2021-10-28 ENCOUNTER — Ambulatory Visit (INDEPENDENT_AMBULATORY_CARE_PROVIDER_SITE_OTHER): Payer: No Typology Code available for payment source | Admitting: Licensed Clinical Social Worker

## 2021-10-28 DIAGNOSIS — F4310 Post-traumatic stress disorder, unspecified: Secondary | ICD-10-CM | POA: Diagnosis not present

## 2021-10-28 NOTE — Progress Notes (Signed)
Virtual Visit via Phone Note   I connected with [employer] [place] on 10/28/21 at 3:00pm by a phone-enabled telemedicine application and verified that I am speaking with the correct person using two identifiers.   I discussed the limitations of evaluation and management by telemedicine and the availability of in person appointments. The patient expressed understanding and agreed to proceed.   LOCATION: Patient: Home Provider: Home Office   History of Present Illness: Pt was referred to therapy by the [facility] for PTSD.    Participation Level: Active     Type of Therapy: Virtual Individual therapy   Treatment Goal addressed: Pt will eliminate his maladaptive behaviors and thinking patterns which interfere with resolution of trauma by Cognitive Restructuring 3/5 X per week AEB self-report.  Progression towards Goal: Progressing   Interventions: MI OARS/Supportive/Psychoeducation   Summary: Patient presented for today's session on time and was alert, oriented x5, with no evidence or self-report of SI/HI or A/V H.  Patient reported ongoing compliance with medication.  Clinician inquired about patient's current emotional ratings, as well as any significant changes in thoughts, feelings or behavior since previous session. Patient's emotional ratings 8/10 for depression, 4/10 for anxiety, 2/10 for anger/irritability. Pt reports, " I just got home from the [place] having a kidney CT scan. They found a mass on one of my kidneys and the other kidney is shrinking." [name] asked open ended questions and next steps. Pt reports on his moods this week and coping skills AEB by self report. Pt reports on his upcoming appointments at the [place]: upcoming cataract surgery 6/14, UTI, Hearing Aid adjustment, BP. Clinician utilized MI OARS to affirm concerns. Pt reports on his maladaptive behaviors and thinking patterns over the past week, which interferes with resolution of trauma symptoms 2 X within the past week. Pt  reports on his veteran organizations over the past week  (veteran's breakfast, [employer] meeting, [employer]). " [name] again, encouraged pt to seek an attorney to help with his disability paperwork, giving pt the name of atty and phone #.   Collaboration of Care: Other: Contact attorney to help with [place] disability paperwork  Patient/Guardian was advised Release of Information must be obtained prior to any record release in order to collaborate their care with an outside provider. Patient/Guardian was advised if they have not alreay done so to contact the registration department to sign all necessary forms in order for [place] to release information regarding their care.   Consent: Patient/Guardian gives verbal consent for treatment and assignment of benefits for services provided during this visit. Patient/Guardian expressed understanding and agreed to proceed.      Plan: nightmares? BP    PLAN: disability paperwork? Attorney?         Assessment and plan: Counselor will continue to meet with patient to address treatment plan goals. Patient will continue to follow recommendations of providers and implement skills learned in session.   Suicidal/Homicidal: Nowithout intent/plan   Therapist Response: Assessed pt's current functioning and reviewed progress.. Assisted pt processing stressors, negative thoughts.  Assisted pt processing for the management of her stressors.   Participation Level: Active   Diagnosis:  PTSD     Follow Up Instructions: I discussed the assessment and treatment plan with the patient. The patient was provided an opportunity to ask questions and all were answered. The patient agreed with the plan and demonstrated an understanding of the instructions.   The patient was advised to call back or seek an in-person evaluation  if the symptoms worsen or if the condition fails to improve as anticipated.   I provided 45 minutes of non-face-to-face time during this encounter.      Camrie Stock S, LCAS

## 2021-11-04 ENCOUNTER — Encounter (HOSPITAL_COMMUNITY): Payer: Self-pay | Admitting: Licensed Clinical Social Worker

## 2021-11-04 ENCOUNTER — Ambulatory Visit (INDEPENDENT_AMBULATORY_CARE_PROVIDER_SITE_OTHER): Payer: No Typology Code available for payment source | Admitting: Licensed Clinical Social Worker

## 2021-11-04 DIAGNOSIS — F4310 Post-traumatic stress disorder, unspecified: Secondary | ICD-10-CM | POA: Diagnosis not present

## 2021-11-04 NOTE — Progress Notes (Signed)
Virtual Visit via Phone Note   I connected with [employer] Kirchner on 11/04/21 at 11:00am by a phone-enabled telemedicine application and verified that I am speaking with the correct person using two identifiers.   I discussed the limitations of evaluation and management by telemedicine and the availability of in person appointments. The patient expressed understanding and agreed to proceed.   LOCATION: Patient: Home Provider: Home Office   History of Present Illness: Pt was referred to therapy by the [facility] for PTSD.    Participation Level: Active     Type of Therapy: Virtual Individual therapy   Treatment Goal addressed: Pt will eliminate his maladaptive behaviors and thinking patterns which interfere with resolution of trauma by Cognitive Restructuring 3/5 X per week AEB self-report.  Progression towards Goal: Progressing   Interventions: MI OARS/Supportive/Psychoeducation   Summary: Patient presented for today's session on time and was alert, oriented x5, with no evidence or self-report of SI/HI or A/V H.  Patient reported ongoing compliance with medication.  Clinician inquired about patient's current emotional ratings, as well as any significant changes in thoughts, feelings or behavior since previous session. Patient's emotional ratings 8/10 for depression, 4/10 for anxiety, 2/10 for anger/irritability. Pt reports on his moods this week and coping skills AEB by self report. Pt reports on his appointments at the [place]: completed cataract surgery 6/14, Hearing Aid adjustment, BP, lesion on kidney (upcoming MRI). Clinician utilized MI OARS to affirm concerns. Pt reports on his maladaptive behaviors and thinking patterns over the past week, which interferes with resolution of trauma symptoms 2 X within the past week. Pt reports on his veteran organizations over the past week  (veteran's breakfast, [employer] meeting, [employer]). [name] and pt explored his change in behaviors: using more  boundaries, staying away from negative people, staying away from chaos, not bailing out everyone.    Collaboration of Care: Other: Contact attorney to help with VA disability paperwork  Patient/Guardian was advised Release of Information must be obtained prior to any record release in order to collaborate their care with an outside provider. Patient/Guardian was advised if they have not alreay done so to contact the registration department to sign all necessary forms in order for [place] to release information regarding their care.   Consent: Patient/Guardian gives verbal consent for treatment and assignment of benefits for services provided during this visit. Patient/Guardian expressed understanding and agreed to proceed.      Plan: nightmares? BP    PLAN: disability paperwork? Attorney?         Assessment and plan: Counselor will continue to meet with patient to address treatment plan goals. Patient will continue to follow recommendations of providers and implement skills learned in session.   Suicidal/Homicidal: Nowithout intent/plan   Therapist Response: Assessed pt's current functioning and reviewed progress.. Assisted pt processing stressors, negative thoughts.  Assisted pt processing for the management of her stressors.   Participation Level: Active   Diagnosis:  PTSD     Follow Up Instructions: I discussed the assessment and treatment plan with the patient. The patient was provided an opportunity to ask questions and all were answered. The patient agreed with the plan and demonstrated an understanding of the instructions.   The patient was advised to call back or seek an in-person evaluation if the symptoms worsen or if the condition fails to improve as anticipated.   I provided 45 minutes of non-face-to-face time during this encounter.     Elman Dettman S, LCAS

## 2021-11-08 ENCOUNTER — Other Ambulatory Visit: Payer: Self-pay | Admitting: Nurse Practitioner

## 2021-11-08 DIAGNOSIS — N281 Cyst of kidney, acquired: Secondary | ICD-10-CM

## 2021-11-11 ENCOUNTER — Encounter (HOSPITAL_COMMUNITY): Payer: Self-pay | Admitting: Licensed Clinical Social Worker

## 2021-11-11 ENCOUNTER — Ambulatory Visit (INDEPENDENT_AMBULATORY_CARE_PROVIDER_SITE_OTHER): Payer: No Typology Code available for payment source | Admitting: Licensed Clinical Social Worker

## 2021-11-11 DIAGNOSIS — F4310 Post-traumatic stress disorder, unspecified: Secondary | ICD-10-CM

## 2021-11-18 ENCOUNTER — Ambulatory Visit (HOSPITAL_COMMUNITY): Payer: No Typology Code available for payment source | Admitting: Licensed Clinical Social Worker

## 2021-11-23 ENCOUNTER — Ambulatory Visit
Admission: RE | Admit: 2021-11-23 | Discharge: 2021-11-23 | Disposition: A | Discharge status: Home / Self Care | Payer: No Typology Code available for payment source | Source: Ambulatory Visit | Attending: Nurse Practitioner | Admitting: Nurse Practitioner

## 2021-11-23 ENCOUNTER — Ambulatory Visit: Payer: No Typology Code available for payment source

## 2021-11-23 DIAGNOSIS — N281 Cyst of kidney, acquired: Secondary | ICD-10-CM | POA: Diagnosis not present

## 2021-11-23 MED ORDER — GADOBUTROL 1 MMOL/ML IV SOLN
7.5000 mL | Freq: Once | INTRAVENOUS | Status: AC | PRN
  Administered 2021-11-23: 7.5 mL via INTRAVENOUS

## 2021-11-25 ENCOUNTER — Ambulatory Visit (INDEPENDENT_AMBULATORY_CARE_PROVIDER_SITE_OTHER): Payer: No Typology Code available for payment source | Admitting: Licensed Clinical Social Worker

## 2021-11-25 ENCOUNTER — Encounter (HOSPITAL_COMMUNITY): Payer: Self-pay | Admitting: Licensed Clinical Social Worker

## 2021-11-25 DIAGNOSIS — F4310 Post-traumatic stress disorder, unspecified: Secondary | ICD-10-CM

## 2021-11-25 NOTE — Progress Notes (Signed)
Virtual Visit via Video Note   I connected with [employer] Keefe on 11/25/21 at 11:00am by a video-enabled telemedicine application and verified that I am speaking with the correct person using two identifiers.   I discussed the limitations of evaluation and management by telemedicine and the availability of in person appointments. The patient expressed understanding and agreed to proceed.   LOCATION: Patient: Home Provider: Home Office   History of Present Illness: Pt was referred to therapy by the [facility] for PTSD.    Participation Level: Active     Type of Therapy: Virtual Individual therapy   Treatment Goal addressed: Pt will eliminate his maladaptive behaviors and thinking patterns which interfere with resolution of trauma by Cognitive Restructuring 3/5 X per week AEB self-report.  Progression towards Goal: Progressing   Interventions: CBT/Supportive/Psychoeducation   Summary: Patient presented for today's session on time and was alert, oriented x5, with no evidence or self-report of SI/HI or A/V H.  Patient reported ongoing compliance with medication.  Clinician inquired about patient's current emotional ratings, as well as any significant changes in thoughts, feelings or behavior since previous session. Patient's emotional ratings 5/10 for depression, 5/10 for anxiety, 2/10 for anger/irritability. Pt reports on his moods this week and coping skills AEB by self report. Pt provides an update on his appointments at the [place]: BP, lesion on kidney (MRI completed, no results given). Clinician utilized CBT to process concerns and  worries health issues and challenges. Clinician processed thoughts, feelings, and behaviors. Clinician discussed coping skills and options for supporting himself through these challenging times.Pt reports on his maladaptive behaviors and thinking patterns over the past week, which interferes with resolution of trauma symptoms 2 X within the past week. 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, [employer]) and what veterans they assisted. [name] and pt discussed questions for PCP: nightmares, sleep hours. Will continue with a list before PCP appointment.    Collaboration of Care: Other: Contact attorney to help with VA disability paperwork  Patient/Guardian was advised Release of Information must be obtained prior to any record release in order to collaborate their care with an outside provider. Patient/Guardian was advised if they have not alreay done so to contact the registration department to sign all necessary forms in order for [place] to release information regarding their care.   Consent: Patient/Guardian gives verbal consent for treatment and assignment of benefits for services provided during this visit. Patient/Guardian expressed understanding and agreed to proceed.      Plan: nightmares?   PLAN: disability paperwork? Attorney?         Assessment and plan: Counselor will continue to meet with patient to address treatment plan goals. Patient will continue to follow recommendations of providers and implement skills learned in session.   Suicidal/Homicidal: Nowithout intent/plan   Therapist Response: Assessed pt's current functioning and reviewed progress.. Assisted pt processing stressors, negative thoughts.  Assisted pt processing for the management of her stressors.   Participation Level: Active   Diagnosis:  PTSD     Follow Up Instructions: I discussed the assessment and treatment plan with the patient. The patient was provided an opportunity to ask questions and all were answered. The patient agreed with the plan and demonstrated an understanding of the instructions.   The patient was advised to call back or seek an in-person evaluation if the symptoms worsen or if the condition fails to improve as anticipated.   I  provided 45 minutes of non-face-to-face time during this  encounter.     Irianna Gilday S, LCAS

## 2021-12-02 ENCOUNTER — Ambulatory Visit (INDEPENDENT_AMBULATORY_CARE_PROVIDER_SITE_OTHER): Payer: No Typology Code available for payment source | Admitting: Licensed Clinical Social Worker

## 2021-12-02 ENCOUNTER — Encounter (HOSPITAL_COMMUNITY): Payer: Self-pay | Admitting: Licensed Clinical Social Worker

## 2021-12-02 DIAGNOSIS — F4310 Post-traumatic stress disorder, unspecified: Secondary | ICD-10-CM

## 2021-12-02 NOTE — Progress Notes (Signed)
Virtual Visit via Video Note   I connected with [employer] Valliant on 12/02/21 at 11:00am by a video-enabled telemedicine application and verified that I am speaking with the correct person using two identifiers.   I discussed the limitations of evaluation and management by telemedicine and the availability of in person appointments. The patient expressed understanding and agreed to proceed.   LOCATION: Patient: Home Provider: Home Office   History of Present Illness: Pt was referred to therapy by the [facility] for PTSD.    Participation Level: Active     Type of Therapy: Virtual Individual therapy   Treatment Goal addressed: Pt will eliminate his maladaptive behaviors and thinking patterns which interfere with resolution of trauma by Cognitive Restructuring 3/5 X per week AEB self-report.  Progression towards Goal: Progressing   Interventions: CBT/Supportive/Psychoeducation   Summary: Patient presented for today's session on time and was alert, oriented x5, with no evidence or self-report of SI/HI or A/V H.  Patient reported ongoing compliance with medication.  Clinician inquired about patient's current emotional ratings, as well as any significant changes in thoughts, feelings or behavior since previous session. Patient's emotional ratings 6/10 for depression, 6/10 for anxiety, 2/10 for anger/irritability. Pt reports on his moods this week and coping skills AEB by self report. Pt provides an update on his appointments at the [place]: BP, lesion on kidney, gall bladder, PCP . Clinician utilized CBT to process concerns and  worries with health issues and challenges. Clinician processed thoughts, feelings, and behaviors. Clinician discussed coping skills and options for supporting himself through these challenging times. Pt reports on his maladaptive behaviors and thinking patterns over the past week, which interferes with resolution of trauma symptoms 3 X within the past week. Pt provides an  update on his veteran organizations over the past week  ([employer], [employer]) and what veterans they assisted. "This makes me feel good to help these other vets.who are more damaged than me." Pt reports on his experiences with being discharged from the [employer]. Will continue at next session.  Collaboration of Care: Other: Contact attorney to help with VA disability paperwork  Patient/Guardian was advised Release of Information must be obtained prior to any record release in order to collaborate their care with an outside provider. Patient/Guardian was advised if they have not alreay done so to contact the registration department to sign all necessary forms in order for [place] to release information regarding their care.   Consent: Patient/Guardian gives verbal consent for treatment and assignment of benefits for services provided during this visit. Patient/Guardian expressed understanding and agreed to proceed.       Assessment and plan: Counselor will continue to meet with patient to address treatment plan goals. Patient will continue to follow recommendations of providers and implement skills learned in session.   Suicidal/Homicidal: Nowithout intent/plan   Therapist Response: Assessed pt's current functioning and reviewed progress.. Assisted pt processing stressors, negative thoughts.  Assisted pt processing for the management of her stressors.   Participation Level: Active   Diagnosis:  PTSD     Follow Up Instructions: I discussed the assessment and treatment plan with the patient. The patient was provided an opportunity to ask questions and all were answered. The patient agreed with the plan and demonstrated an understanding of the instructions.   The patient was advised to call back or seek an in-person evaluation if the symptoms worsen or if the condition fails to improve as anticipated.   I provided 45 minutes  of non-face-to-face time during this encounter.      Beth Spackman S, LCAS

## 2021-12-09 ENCOUNTER — Ambulatory Visit (HOSPITAL_COMMUNITY): Payer: No Typology Code available for payment source | Admitting: Licensed Clinical Social Worker

## 2021-12-16 ENCOUNTER — Encounter (HOSPITAL_COMMUNITY): Payer: Self-pay | Admitting: Licensed Clinical Social Worker

## 2021-12-16 ENCOUNTER — Ambulatory Visit (INDEPENDENT_AMBULATORY_CARE_PROVIDER_SITE_OTHER): Payer: No Typology Code available for payment source | Admitting: Licensed Clinical Social Worker

## 2021-12-16 DIAGNOSIS — F4310 Post-traumatic stress disorder, unspecified: Secondary | ICD-10-CM

## 2021-12-16 NOTE — Progress Notes (Signed)
Virtual Visit via Video Note   I connected with [employer] Stegman on 12/16/21 at 11:00am by a video-enabled telemedicine application and verified that I am speaking with the correct person using two identifiers.   I discussed the limitations of evaluation and management by telemedicine and the availability of in person appointments. The patient expressed understanding and agreed to proceed.   LOCATION: Patient: Home Provider: Home Office   History of Present Illness: Pt was referred to therapy by the [facility] for PTSD.    Participation Level: Active     Type of Therapy: Virtual Individual therapy   Treatment Goal addressed: Pt will eliminate his maladaptive behaviors and thinking patterns which interfere with resolution of trauma by Cognitive Restructuring 3/5 X per week AEB self-report.  Progression towards Goal: Progressing   Interventions: CBT/Supportive/Psychoeducation   Summary: Patient presented for today's session on time and was alert, oriented x5, with no evidence or self-report of SI/HI or A/V H.  Patient reported ongoing compliance with medication.  Clinician inquired about patient's current emotional ratings, as well as any significant changes in thoughts, feelings or behavior since previous session. Patient's emotional ratings 6/10 for depression, 6/10 for anxiety, 2/10 for anger/irritability. Pt reports on his moods this week and coping skills AEB by self report. . Clinician utilized CBT to process concerns and  worries with health issues and challenges. Clinician processed thoughts, feelings, and behaviors. Clinician discussed coping skills and options for supporting himself through these challenging times. Pt reports on his maladaptive behaviors and thinking patterns over the past week, which interferes with resolution of trauma symptoms 2 X within the past week. 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, [employer]) and  what veterans they assisted. "This helps with my trauma symptoms.." Pt continue to talk about his experiences in the [employer].  Collaboration of Care: Other: Contact attorney to help with VA disability paperwork  Patient/Guardian was advised Release of Information must be obtained prior to any record release in order to collaborate their care with an outside provider. Patient/Guardian was advised if they have not alreay done so to contact the registration department to sign all necessary forms in order for [place] to release information regarding their care.   Consent: Patient/Guardian gives verbal consent for treatment and assignment of benefits for services provided during this visit. Patient/Guardian expressed understanding and agreed to proceed.       Assessment and plan: Counselor will continue to meet with patient to address treatment plan goals. Patient will continue to follow recommendations of providers and implement skills learned in session.   Suicidal/Homicidal: Nowithout intent/plan   Therapist Response: Assessed pt's current functioning and reviewed progress.. Assisted pt processing stressors, negative thoughts.  Assisted pt processing for the management of her stressors.   Participation Level: Active   Diagnosis:  PTSD     Follow Up Instructions: I discussed the assessment and treatment plan with the patient. The patient was provided an opportunity to ask questions and all were answered. The patient agreed with the plan and demonstrated an understanding of the instructions.   The patient was advised to call back or seek an in-person evaluation if the symptoms worsen or if the condition fails to improve as anticipated.   I provided 45 minutes of non-face-to-face time during this encounter.     Aloni Chuang S, LCAS

## 2021-12-23 ENCOUNTER — Encounter (HOSPITAL_COMMUNITY): Payer: Self-pay | Admitting: Licensed Clinical Social Worker

## 2021-12-23 ENCOUNTER — Ambulatory Visit (INDEPENDENT_AMBULATORY_CARE_PROVIDER_SITE_OTHER): Payer: No Typology Code available for payment source | Admitting: Licensed Clinical Social Worker

## 2021-12-23 DIAGNOSIS — F4310 Post-traumatic stress disorder, unspecified: Secondary | ICD-10-CM | POA: Diagnosis not present

## 2021-12-23 NOTE — Progress Notes (Signed)
Virtual Visit via Video Note   I connected with [employer] Sherley on 12/23/21 at 11:00am by a video-enabled telemedicine application and verified that I am speaking with the correct person using two identifiers.   I discussed the limitations of evaluation and management by telemedicine and the availability of in person appointments. The patient expressed understanding and agreed to proceed.   LOCATION: Patient: Home Provider: Home Office   History of Present Illness: Pt was referred to therapy by the [facility] for PTSD.    Participation Level: Active     Type of Therapy: Virtual Individual therapy   Treatment Goal addressed: Pt will eliminate his maladaptive behaviors and thinking patterns which interfere with resolution of trauma by Cognitive Restructuring 3/5 X per week AEB self-report.  Progression towards Goal: Progressing   Interventions: CBT/Supportive/Psychoeducation   Summary: Patient presented for today's session on time and was alert, oriented x5, with no evidence or self-report of SI/HI or A/V H. Patient reported ongoing compliance with medication.  Clinician inquired about patient's current emotional ratings, as well as any significant changes in thoughts, feelings or behavior since previous session. Patient's emotional ratings 6/10 for depression, 6/10 for anxiety, 4/10 for anger/irritability. Pt reports on his moods this week and coping skills AEB by self report. . Clinician utilized CBT to process concerns and  worries with health issues and challenges. Clinician processed thoughts, feelings, and behaviors. Clinician discussed coping skills and options for supporting himself through these challenging times. Pt reports on his maladaptive behaviors and thinking patterns over the past week, which interferes with resolution of trauma symptoms 2/5 X within the past week. 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, [employer]) and  what veterans they assisted. Pt continues working with the [place] [facility] in [place] helping with his appeal of his disability request. [name] asked open ended questions.  Pt continues to talk about his experiences in the [place], including his trauma-related his experiences.  Collaboration of Care: Other: Contact [employer] to help with VA disability paperwork  Patient/Guardian was advised Release of Information must be obtained prior to any record release in order to collaborate their care with an outside provider. Patient/Guardian was advised if they have not alreay done so to contact the registration department to sign all necessary forms in order for [place] to release information regarding their care.   Consent: Patient/Guardian gives verbal consent for treatment and assignment of benefits for services provided during this visit. Patient/Guardian expressed understanding and agreed to proceed.       Assessment and plan: Counselor will continue to meet with patient to address treatment plan goals. Patient will continue to follow recommendations of providers and implement skills learned in session.   Suicidal/Homicidal: Nowithout intent/plan   Therapist Response: Assessed pt's current functioning and reviewed progress.. Assisted pt processing stressors, negative thoughts.  Assisted pt processing for the management of her stressors.   Participation Level: Active   Diagnosis:  PTSD     Follow Up Instructions: I discussed the assessment and treatment plan with the patient. The patient was provided an opportunity to ask questions and all were answered. The patient agreed with the plan and demonstrated an understanding of the instructions.   The patient was advised to call back or seek an in-person evaluation if the symptoms worsen or if the condition fails to improve as anticipated.   I provided 45 minutes of non-face-to-face time during this encounter.     Garik Diamant  S, LCAS

## 2021-12-30 ENCOUNTER — Ambulatory Visit (HOSPITAL_COMMUNITY): Payer: Medicare PPO | Admitting: Licensed Clinical Social Worker

## 2022-01-06 ENCOUNTER — Ambulatory Visit (HOSPITAL_COMMUNITY): Payer: Medicare PPO | Admitting: Licensed Clinical Social Worker

## 2022-01-13 ENCOUNTER — Ambulatory Visit (INDEPENDENT_AMBULATORY_CARE_PROVIDER_SITE_OTHER): Payer: No Typology Code available for payment source | Admitting: Licensed Clinical Social Worker

## 2022-01-13 DIAGNOSIS — F4310 Post-traumatic stress disorder, unspecified: Secondary | ICD-10-CM

## 2022-01-21 ENCOUNTER — Ambulatory Visit (INDEPENDENT_AMBULATORY_CARE_PROVIDER_SITE_OTHER): Payer: No Typology Code available for payment source | Admitting: Licensed Clinical Social Worker

## 2022-01-21 DIAGNOSIS — F4310 Post-traumatic stress disorder, unspecified: Secondary | ICD-10-CM

## 2022-01-27 ENCOUNTER — Ambulatory Visit (INDEPENDENT_AMBULATORY_CARE_PROVIDER_SITE_OTHER): Payer: No Typology Code available for payment source | Admitting: Licensed Clinical Social Worker

## 2022-01-27 ENCOUNTER — Encounter (HOSPITAL_COMMUNITY): Payer: Self-pay | Admitting: Licensed Clinical Social Worker

## 2022-01-27 DIAGNOSIS — F4310 Post-traumatic stress disorder, unspecified: Secondary | ICD-10-CM

## 2022-01-27 NOTE — Progress Notes (Signed)
Virtual Visit via Video Note   I connected with [employer] Stankovic on 01/27/22 at 11:00am by a video-enabled telemedicine application and verified that I am speaking with the correct person using two identifiers.   I discussed the limitations of evaluation and management by telemedicine and the availability of in person appointments. The patient expressed understanding and agreed to proceed.   LOCATION: Patient: Home Provider: Home Office   History of Present Illness: Pt was referred to therapy by the [facility] for PTSD.    Participation Level: Active     Type of Therapy: Virtual Individual therapy   Treatment Goal addressed: Pt will eliminate his maladaptive behaviors and thinking patterns which interfere with resolution of trauma by Cognitive Restructuring 3/5 X per week AEB self-report.  Progression towards Goal: Progressing   Interventions: CBT/Supportive/Psychoeducation   Summary: Patient presented for today's session on time and was alert, oriented x5, with no evidence or self-report of SI/HI or A/V H. Patient reported ongoing compliance with medication.  Clinician inquired about patient's current emotional ratings, as well as any significant changes in thoughts, feelings or behavior since previous session. Patient's emotional ratings 6/10 for depression, 6/10 for anxiety, 4/10 for anger/irritability. Pt reports on his moods this week and coping skills AEB by self report.  Today cln gave pt opportunity to talk about 9/11, how it affected him then and now. [name] asked open ended questions and trauma triggers discussing 9/11, which may cause maladaptive behaviors and thinking patterns. [name] checked with pt before end of session to confirm patient was not having any maladaptive thinking patterns.  Collaboration of Care: Other: Contact [employer] to help with VA disability paperwork  Patient/Guardian was advised Release of Information must be obtained prior to any record release in order to  collaborate their care with an outside provider. Patient/Guardian was advised if they have not alreay done so to contact the registration department to sign all necessary forms in order for [place] to release information regarding their care.   Consent: Patient/Guardian gives verbal consent for treatment and assignment of benefits for services provided during this visit. Patient/Guardian expressed understanding and agreed to proceed.       Assessment and plan: Counselor will continue to meet with patient to address treatment plan goals. Patient will continue to follow recommendations of providers and implement skills learned in session.   Suicidal/Homicidal: Nowithout intent/plan   Therapist Response: Assessed pt's current functioning and reviewed progress.. Assisted pt processing stressors, negative thoughts.  Assisted pt processing for the management of her stressors.   Participation Level: Active   Diagnosis:  PTSD     Follow Up Instructions: I discussed the assessment and treatment plan with the patient. The patient was provided an opportunity to ask questions and all were answered. The patient agreed with the plan and demonstrated an understanding of the instructions.   The patient was advised to call back or seek an in-person evaluation if the symptoms worsen or if the condition fails to improve as anticipated.   I provided 45 minutes of non-face-to-face time during this encounter.     Caitlin Hillmer S, LCAS

## 2022-02-03 ENCOUNTER — Ambulatory Visit (INDEPENDENT_AMBULATORY_CARE_PROVIDER_SITE_OTHER): Payer: No Typology Code available for payment source | Admitting: Licensed Clinical Social Worker

## 2022-02-03 ENCOUNTER — Encounter (HOSPITAL_COMMUNITY): Payer: Self-pay | Admitting: Licensed Clinical Social Worker

## 2022-02-03 DIAGNOSIS — F4310 Post-traumatic stress disorder, unspecified: Secondary | ICD-10-CM | POA: Diagnosis not present

## 2022-02-03 NOTE — Progress Notes (Signed)
Virtual Visit via Video Note   I connected with [employer] Plaisted on 02/03/22 at 11:00am by a video-enabled telemedicine application and verified that I am speaking with the correct person using two identifiers.   I discussed the limitations of evaluation and management by telemedicine and the availability of in person appointments. The patient expressed understanding and agreed to proceed.   LOCATION: Patient: Home Provider: Home Office   History of Present Illness: Pt was referred to therapy by the [facility] for PTSD.    Participation Level: Active     Type of Therapy: Virtual Individual therapy   Treatment Goal addressed: Pt will eliminate his maladaptive behaviors and thinking patterns which interfere with resolution of trauma by Cognitive Restructuring 3/5 X per week AEB self-report.  Progression towards Goal: Progressing   Interventions: CBT/Supportive/Psychoeducation   Summary: Patient presented for today's session on time and was alert, oriented x5, with no evidence or self-report of SI/HI or A/V H. Patient reported ongoing compliance with medication.  Clinician inquired about patient's current emotional ratings, as well as any significant changes in thoughts, feelings or behavior since previous session. Patient's emotional ratings 6/10 for depression, 6/10 for anxiety, 4/10 for anger/irritability. Pt reports on his moods this week and coping skills AEB by self report. "This has not been a good week. My step-daughter passed away 15-Aug-2022." [name] provided education on the stages of grief. "My daughter also has cancer and is meeting with her surgeon this week."[name] asked open ended questions. Clinician utilized CBT to process concerns and  worries with his daughter's cancer diagnosis issues and challenges. Clinician processed thoughts, feelings, and behaviors. Clinician discussed coping skills and options for supporting himself through these challenging times. Pt reports on his  maladaptive behaviors and thinking patterns over the past week, which interferes with resolution of trauma symptoms 2/5 X within the past week. 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, [employer]) and what veterans they assisted.  [name] asked open ended questions.  Pt continues to talk about his experiences in the [place], including his trauma-related his experiences.  Collaboration of Care: Other: Contact [employer] to help with VA disability paperwork  Patient/Guardian was advised Release of Information must be obtained prior to any record release in order to collaborate their care with an outside provider. Patient/Guardian was advised if they have not alreay done so to contact the registration department to sign all necessary forms in order for [place] to release information regarding their care.   Consent: Patient/Guardian gives verbal consent for treatment and assignment of benefits for services provided during this visit. Patient/Guardian expressed understanding and agreed to proceed.       Assessment and plan: Counselor will continue to meet with patient to address treatment plan goals. Patient will continue to follow recommendations of providers and implement skills learned in session.   Suicidal/Homicidal: Nowithout intent/plan   Therapist Response: Assessed pt's current functioning and reviewed progress.. Assisted pt processing stressors, negative thoughts.  Assisted pt processing for the management of her stressors.   Participation Level: Active   Diagnosis:  PTSD     Follow Up Instructions: I discussed the assessment and treatment plan with the patient. The patient was provided an opportunity to ask questions and all were answered. The patient agreed with the plan and demonstrated an understanding of the instructions.   The patient was advised to call back or seek an in-person evaluation if the symptoms worsen or if the  condition fails to  improve as anticipated.   I provided 45 minutes of non-face-to-face time during this encounter.     Hang Ammon S, LCAS

## 2022-02-08 ENCOUNTER — Encounter (HOSPITAL_COMMUNITY): Payer: Self-pay | Admitting: Licensed Clinical Social Worker

## 2022-02-08 NOTE — Progress Notes (Signed)
Virtual Visit via Video Note   I connected with [employer] Gum on 01/21/22 at 11:00am by a video-enabled telemedicine application and verified that I am speaking with the correct person using two identifiers.   I discussed the limitations of evaluation and management by telemedicine and the availability of in person appointments. The patient expressed understanding and agreed to proceed.   LOCATION: Patient: Home Provider: Home Office   History of Present Illness: Pt was referred to therapy by the [facility] for PTSD.    Participation Level: Active     Type of Therapy: Virtual Individual therapy   Treatment Goal addressed: Pt will eliminate his maladaptive behaviors and thinking patterns which interfere with resolution of trauma by Cognitive Restructuring 3/5 X per week AEB self-report.  Progression towards Goal: Progressing   Interventions: CBT/Supportive/Psychoeducation   Summary: Patient presented for today's session on time and was alert, oriented x5, with no evidence or self-report of SI/HI or A/V H. Patient reported ongoing compliance with medication.  Clinician inquired about patient's current emotional ratings, as well as any significant changes in thoughts, feelings or behavior since previous session. Patient's emotional ratings 6/10 for depression, 6/10 for anxiety, 4/10 for anger/irritability. Pt reports on his moods this week and coping skills AEB by self report. . Clinician utilized CBT to process concerns with health issues and challenges. Clinician processed thoughts, feelings, and behaviors. Clinician discussed coping skills and options for supporting himself through these challenging times. Pt reports on his maladaptive behaviors and thinking patterns over the past week, which interferes with resolution of trauma symptoms 2/5 X within the past week. 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, [employer]) and what veterans  they assisted. Pt continues working with the [place] office in [place] helping with his appeal of his disability request. [name] asked open ended questions.  Pt continues to talk about his experiences in the [place], including his trauma-related his experiences. [name] used CPT to assist patient with "stuck points."  Collaboration of Care: Other: Contact [employer] to help with VA disability paperwork  Patient/Guardian was advised Release of Information must be obtained prior to any record release in order to collaborate their care with an outside provider. Patient/Guardian was advised if they have not alreay done so to contact the registration department to sign all necessary forms in order for [place] to release information regarding their care.   Consent: Patient/Guardian gives verbal consent for treatment and assignment of benefits for services provided during this visit. Patient/Guardian expressed understanding and agreed to proceed.       Assessment and plan: Counselor will continue to meet with patient to address treatment plan goals. Patient will continue to follow recommendations of providers and implement skills learned in session.   Suicidal/Homicidal: Nowithout intent/plan   Therapist Response: Assessed pt's current functioning and reviewed progress.. Assisted pt processing stressors, negative thoughts.  Assisted pt processing for the management of her stressors.   Participation Level: Active   Diagnosis:  PTSD     Follow Up Instructions: I discussed the assessment and treatment plan with the patient. The patient was provided an opportunity to ask questions and all were answered. The patient agreed with the plan and demonstrated an understanding of the instructions.   The patient was advised to call back or seek an in-person evaluation if the symptoms worsen or if the condition fails to improve as anticipated.   I provided 45 minutes of non-face-to-face time during this  encounter.      Riot Waterworth S, LCAS

## 2022-02-09 ENCOUNTER — Encounter (HOSPITAL_COMMUNITY): Payer: Self-pay | Admitting: Licensed Clinical Social Worker

## 2022-02-09 NOTE — Progress Notes (Signed)
Virtual Visit via Video Note   I connected with [employer] Nickelson on 01/13/22 at 11:00am by a video-enabled telemedicine application and verified that I am speaking with the correct person using two identifiers.   I discussed the limitations of evaluation and management by telemedicine and the availability of in person appointments. The patient expressed understanding and agreed to proceed.   LOCATION: Patient: Home Provider: Home Office   History of Present Illness: Pt was referred to therapy by the [facility] for PTSD.    Participation Level: Active     Type of Therapy: Virtual Individual therapy   Treatment Goal addressed: Pt will eliminate his maladaptive behaviors and thinking patterns which interfere with resolution of trauma by Cognitive Restructuring 3/5 X per week AEB self-report.  Progression towards Goal: Progressing   Interventions: CBT/Supportive/Psychoeducation   Summary: Patient presented for today's session on time and was alert, oriented x5, with no evidence or self-report of SI/HI or A/V H. Patient reported ongoing compliance with medication.  Clinician inquired about patient's current emotional ratings, as well as any significant changes in thoughts, feelings or behavior since previous session. Patient's emotional ratings 6/10 for depression, 6/10 for anxiety, 4/10 for anger/irritability. Pt reports on his moods this week and coping skills AEB by self report. . Clinician utilized CBT to process concerns and  worries with health issues and challenges. Clinician processed thoughts, feelings, and behaviors. Clinician discussed coping skills and options for supporting himself. Pt reports on his maladaptive behaviors and thinking patterns over the past week, which interferes with resolution of trauma symptoms 3/5 X within the past week. 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, [employer]) and what veterans they assisted. Pt  continues working with the [place] office in [place] helping with his appeal of his disability request. [name] asked open ended questions.  Pt continues to talk about his experiences in the [place], including his trauma-related his experiences. [name] used CPT to assist patient with trauma memories from his expereinces in the [employer].  Collaboration of Care: Other: Contact [employer] to help with VA disability paperwork  Patient/Guardian was advised Release of Information must be obtained prior to any record release in order to collaborate their care with an outside provider. Patient/Guardian was advised if they have not alreay done so to contact the registration department to sign all necessary forms in order for [place] to release information regarding their care.   Consent: Patient/Guardian gives verbal consent for treatment and assignment of benefits for services provided during this visit. Patient/Guardian expressed understanding and agreed to proceed.       Assessment and plan: Counselor will continue to meet with patient to address treatment plan goals. Patient will continue to follow recommendations of providers and implement skills learned in session.   Suicidal/Homicidal: Nowithout intent/plan   Therapist Response: Assessed pt's current functioning and reviewed progress.. Assisted pt processing stressors, negative thoughts.  Assisted pt processing for the management of her stressors.   Participation Level: Active   Diagnosis:  PTSD     Follow Up Instructions: I discussed the assessment and treatment plan with the patient. The patient was provided an opportunity to ask questions and all were answered. The patient agreed with the plan and demonstrated an understanding of the instructions.   The patient was advised to call back or seek an in-person evaluation if the symptoms worsen or if the condition fails to improve as anticipated.   I provided 45 minutes  of non-face-to-face time during this encounter.      Arissa Fagin S, LCAS

## 2022-02-10 ENCOUNTER — Encounter (HOSPITAL_COMMUNITY): Payer: Self-pay | Admitting: Licensed Clinical Social Worker

## 2022-02-10 ENCOUNTER — Ambulatory Visit (INDEPENDENT_AMBULATORY_CARE_PROVIDER_SITE_OTHER): Payer: No Typology Code available for payment source | Admitting: Licensed Clinical Social Worker

## 2022-02-10 DIAGNOSIS — F4310 Post-traumatic stress disorder, unspecified: Secondary | ICD-10-CM

## 2022-02-10 NOTE — Progress Notes (Signed)
Virtual Visit via Video Note   I connected with [employer] Clevinger on 02/10/22 at 11:00am by a video-enabled telemedicine application and verified that I am speaking with the correct person using two identifiers.   I discussed the limitations of evaluation and management by telemedicine and the availability of in person appointments. The patient expressed understanding and agreed to proceed.   LOCATION: Patient: Home Provider: Home Office   History of Present Illness: Pt was referred to therapy by the [facility] for PTSD.    Participation Level: Active     Type of Therapy: Virtual Individual therapy   Treatment Goal addressed: Pt will eliminate his maladaptive behaviors and thinking patterns which interfere with resolution of trauma by Cognitive Restructuring 3/5 X per week AEB self-report.  Progression towards Goal: Progressing   Interventions: CBT/Supportive/Psychoeducation   Summary: Patient presented for today's session on time and was alert, oriented x5, with no evidence or self-report of SI/HI or A/V H. Patient reported ongoing compliance with medication.  Clinician inquired about patient's current emotional ratings, as well as any significant changes in thoughts, feelings or behavior since previous session. Patient's emotional ratings 5/10 for depression, 5/10 for anxiety, 2/10 for anger/irritability. Pt reports on his moods this week and coping skills AEB by self report. "This has not been a good week. "My step-daughter' funeral is this week.." Again, [name] provided education on the stages of grief. "My daughter has cervical cancer and is met with her surgeon this week."[name] asked open ended questions. Clinician utilized CBT to process concerns and  worries with his daughter's cancer diagnosis issues and challenges. Clinician processed thoughts, feelings, and behaviors. Clinician discussed coping skills and options for supporting himself through these challenging times. Pt reports on  his maladaptive behaviors and thinking patterns over the past week, which interferes with resolution of trauma symptoms 2/5 X within the past week. 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, [employer]) and what veterans they assisted.  [name] asked open ended questions.  Pt continues to talk about his experiences in the [place], including his trauma-related his experiences, joining the [employer], getting out of the [employer], drinking too much. [name] processed some of his experiences while in the [employer] and after discharge.  Collaboration of Care: Other: Contact [employer] to help with VA disability paperwork  Patient/Guardian was advised Release of Information must be obtained prior to any record release in order to collaborate their care with an outside provider. Patient/Guardian was advised if they have not alreay done so to contact the registration department to sign all necessary forms in order for [place] to release information regarding their care.   Consent: Patient/Guardian gives verbal consent for treatment and assignment of benefits for services provided during this visit. Patient/Guardian expressed understanding and agreed to proceed.       Assessment and plan: Counselor will continue to meet with patient to address treatment plan goals. Patient will continue to follow recommendations of providers and implement skills learned in session.   Suicidal/Homicidal: Nowithout intent/plan   Therapist Response: Assessed pt's current functioning and reviewed progress.. Assisted pt processing stressors, negative thoughts.  Assisted pt processing for the management of her stressors.   Participation Level: Active   Diagnosis:  PTSD     Follow Up Instructions: I discussed the assessment and treatment plan with the patient. The patient was provided an opportunity to ask questions and all were answered. The patient agreed with the plan and demonstrated  an  understanding of the instructions.   The patient was advised to call back or seek an in-person evaluation if the symptoms worsen or if the condition fails to improve as anticipated.   I provided 45 minutes of non-face-to-face time during this encounter.     Bellanie Matthew S, LCAS

## 2022-02-17 ENCOUNTER — Encounter (HOSPITAL_COMMUNITY): Payer: Self-pay | Admitting: Licensed Clinical Social Worker

## 2022-02-17 ENCOUNTER — Ambulatory Visit (INDEPENDENT_AMBULATORY_CARE_PROVIDER_SITE_OTHER): Payer: No Typology Code available for payment source | Admitting: Licensed Clinical Social Worker

## 2022-02-17 DIAGNOSIS — F4310 Post-traumatic stress disorder, unspecified: Secondary | ICD-10-CM | POA: Diagnosis not present
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
## 2022-02-21 ENCOUNTER — Other Ambulatory Visit: Payer: Self-pay | Admitting: Internal Medicine

## 2022-02-21 ENCOUNTER — Other Ambulatory Visit (HOSPITAL_COMMUNITY): Payer: Self-pay | Admitting: Internal Medicine

## 2022-02-21 ENCOUNTER — Encounter: Payer: Self-pay | Admitting: Internal Medicine

## 2022-03-03 ENCOUNTER — Other Ambulatory Visit: Payer: Self-pay | Admitting: Internal Medicine

## 2022-03-03 ENCOUNTER — Other Ambulatory Visit (HOSPITAL_COMMUNITY): Payer: Self-pay | Admitting: Internal Medicine

## 2022-03-03 DIAGNOSIS — Z0189 Encounter for other specified special examinations: Secondary | ICD-10-CM

## 2022-03-05 ENCOUNTER — Ambulatory Visit
Admission: RE | Admit: 2022-03-05 | Discharge: 2022-03-05 | Disposition: A | Discharge status: Home / Self Care | Payer: No Typology Code available for payment source | Source: Ambulatory Visit | Attending: Internal Medicine | Admitting: Internal Medicine

## 2022-03-05 DIAGNOSIS — Z0189 Encounter for other specified special examinations: Secondary | ICD-10-CM | POA: Insufficient documentation

## 2022-03-13 ENCOUNTER — Other Ambulatory Visit: Payer: Self-pay

## 2022-03-13 ENCOUNTER — Encounter: Payer: Self-pay | Admitting: Emergency Medicine

## 2022-03-13 ENCOUNTER — Emergency Department
Admission: EM | Admit: 2022-03-13 | Discharge: 2022-03-13 | Disposition: A | Discharge status: Home / Self Care | Payer: No Typology Code available for payment source | Attending: Emergency Medicine | Admitting: Emergency Medicine

## 2022-03-13 ENCOUNTER — Emergency Department: Payer: No Typology Code available for payment source

## 2022-03-13 DIAGNOSIS — Z23 Encounter for immunization: Secondary | ICD-10-CM | POA: Insufficient documentation

## 2022-03-13 DIAGNOSIS — W540XXA Bitten by dog, initial encounter: Secondary | ICD-10-CM | POA: Insufficient documentation

## 2022-03-13 DIAGNOSIS — S51832A Puncture wound without foreign body of left forearm, initial encounter: Secondary | ICD-10-CM | POA: Insufficient documentation

## 2022-03-13 DIAGNOSIS — S59912A Unspecified injury of left forearm, initial encounter: Secondary | ICD-10-CM | POA: Diagnosis present

## 2022-03-13 MED ORDER — CLINDAMYCIN HCL 150 MG PO CAPS: 300.0000 mg | ORAL_CAPSULE | Freq: Three times a day (TID) | ORAL | 0 refills | Status: AC

## 2022-03-13 MED ORDER — SULFAMETHOXAZOLE-TRIMETHOPRIM 800-160 MG PO TABS: 1.0000 | ORAL_TABLET | Freq: Two times a day (BID) | ORAL | 0 refills | Status: AC

## 2022-03-13 MED ORDER — TETANUS-DIPHTH-ACELL PERTUSSIS 5-2.5-18.5 LF-MCG/0.5 IM SUSY
0.5000 mL | PREFILLED_SYRINGE | Freq: Once | INTRAMUSCULAR | Status: AC
  Administered 2022-03-13: 0.5 mL via INTRAMUSCULAR
  Filled 2022-03-13: qty 0.5

## 2022-03-13 NOTE — ED Triage Notes (Signed)
Pt was bitten by his neighbor's dog while attempting to keep it from biting the mail carrier.  Lac to left forearm.  Bleeding controlled.  Not on thinners.

## 2022-03-13 NOTE — ED Provider Notes (Signed)
[facility] Provider Note    Event Date/Time   First MD Initiated Contact with Patient 03/13/22 1331     (approximate)   History   Animal Bite   HPI  Jackson [name] is a 76 y.o. male with history of HOH, sepsis, kidney stones presents emergency department with a dog bite.  Patient states that his neighbor's dog.  She had him on a leash and he lunged to attack the mail person and he grabbed the collar.  The dog then turned his head and bit his arm.  Unsure if the dog's shots are up-to-date but the dog is known and can be evaluated for 10 days for rabies.  Police were notified by the patient     Physical Exam   Triage Vital Signs: ED Triage Vitals  Enc Vitals Group     BP 03/13/22 1244 98/85     Pulse Rate 03/13/22 1244 73     Resp 03/13/22 1244 18     Temp 03/13/22 1244 97.9 F (36.6 C)     Temp Source 03/13/22 1244 Oral     SpO2 03/13/22 1244 98 %     Weight 03/13/22 1338 185 lb 10 oz (84.2 kg)     Height 03/13/22 1338 '5\' 9"'$  (1.753 m)     Head Circumference --      Peak Flow --      Pain Score 03/13/22 1244 4     Pain Loc --      Pain Edu? --      Excl. in [place]? --     Most recent vital signs: Vitals:   03/13/22 1244  BP: 98/85  Pulse: 73  Resp: 18  Temp: 97.9 F (36.6 C)  SpO2: 98%     General: Awake, no distress.   CV:  Good peripheral perfusion. regular rate and  rhythm Resp:  Normal effort.  Abd:  No distention.   Other:  Left forearm with dog bites noted on the dorsum and volar aspects.  Mostly puncture wound/skin tear   ED Results / Procedures / Treatments   Labs (all labs ordered are listed, but only abnormal results are displayed) Labs Reviewed - No data to display   EKG     RADIOLOGY X-ray of the left forearm    PROCEDURES:   Procedures   MEDICATIONS ORDERED IN ED: Medications  Tdap (BOOSTRIX) injection 0.5 mL (has no administration in time range)     IMPRESSION / MDM / ASSESSMENT AND PLAN / ED  COURSE  I reviewed the triage vital signs and the nursing notes.                              Differential diagnosis includes, but is not limited to, dog bite, laceration, fracture, foreign body  Patient's presentation is most consistent with acute complicated illness / injury requiring diagnostic workup.   Due to this area being on the forearm will not suture this area or closed the skin.  Patient be placed on Bactrim and clindamycin as he is allergic to penicillins.  He is to take these medications for 7 days.  He was also given a Tdap while here in the ED.  X-ray of the left forearm independently reviewed and interpreted by me as being negative for any acute abnormality.  No foreign body or fracture.  Radiologist does comment on gaseous pattern underneath the dressing which would be appropriate for  dog bite.  I did explain all these findings to the patient.  He is to take medication, return if the dog shows any signs of rabies for rabies vaccination, follow-up with his regular doctor if the area is not healing within 1 week.  He was discharged in stable condition and is in agreement treatment plan.      FINAL CLINICAL IMPRESSION(S) / ED DIAGNOSES   Final diagnoses:  Dog bite, initial encounter     Rx / DC Orders   ED Discharge Orders          Ordered    clindamycin (CLEOCIN) 150 MG capsule  3 times daily        03/13/22 1412    sulfamethoxazole-trimethoprim (BACTRIM DS) 800-160 MG tablet  2 times daily        03/13/22 1412             Note:  This document was prepared using Dragon voice recognition software and may include unintentional dictation errors.    Versie Starks, PA-C 03/13/22 1419    Lavonia Drafts, MD 03/13/22 [phone]

## 2022-03-13 NOTE — Discharge Instructions (Addendum)
Take the antibiotics as prescribed.  Since you are allergic to penicillin we cannot give you the Augmentin.  You will need to take 2 antibiotics to cover all germs and a dog's mouth.  There is a gas pattern noted for the tooth went down into your soft tissues.  This will need to heal from the inside out to prevent infection.  You do not have a fracture on your arm.

## 2022-03-13 NOTE — ED Triage Notes (Signed)
First Nurse: Pt here via [facility] with a dog bite, not the pt's dog. Pt states dog was with a woman walking down the road, got upset and bit him. Pt has a lac to his left forearm, not on blood thinners, not bleeding at the moment.  140/84 80 100%

## 2022-05-05 ENCOUNTER — Ambulatory Visit (INDEPENDENT_AMBULATORY_CARE_PROVIDER_SITE_OTHER): Payer: No Typology Code available for payment source | Admitting: Licensed Clinical Social Worker

## 2022-05-05 DIAGNOSIS — F4310 Post-traumatic stress disorder, unspecified: Secondary | ICD-10-CM

## 2022-05-06 ENCOUNTER — Encounter (HOSPITAL_COMMUNITY): Payer: Self-pay | Admitting: Licensed Clinical Social Worker

## 2022-05-06 NOTE — Progress Notes (Signed)
Virtual Visit via Video Note   I connected with [employer] Haste on 05/06/22 at 11:00am by a video-enabled telemedicine application and verified that I am speaking with the correct person using two identifiers.   I discussed the limitations of evaluation and management by telemedicine and the availability of in person appointments. The patient expressed understanding and agreed to proceed.   LOCATION: Patient: Home Provider: Home Office   History of Present Illness: Pt was referred to therapy by the [facility] for PTSD.    Participation Level: Active     Type of Therapy: Virtual Individual therapy   Treatment Goal addressed: Pt will eliminate his maladaptive behaviors and thinking patterns which interfere with resolution of trauma by Cognitive Restructuring 3/5 X per week AEB self-report.   Progression towards Goal: Progressing   Interventions: CBT/Supportive/Psychoeducation   Summary: Patient presented for today's session on time and was alert, oriented x5, with no evidence or self-report of SI/HI or A/V H. Patient reported ongoing compliance with medication.  Clinician inquired about patient's current emotional ratings, as well as any significant changes in thoughts, feelings or behavior since previous session. [name] has been on FMLA for 8 weeks for major surgery. Patient reported his emotional ratings 5/10 for depression, 5/10 for anxiety, 2/10 for anger/irritability. Pt reports on his moods this week and coping skills AEB by self report. "I've had a rough time while you were gone but I'm doing better now. I've had quite a few health problems and have been to the [place] quite often but now they are under control. "My daughter is in the hospital having breast cancer surgery and cervical cancer surgery. [name] and pt reviewed his feelings and helped pt identify his feelings. Clunucian utilized CBT to process concerns and  worries with his daughter's cancer diagnosis issues and challenges.  Clinician processed thoughts, feelings, and behaviors. Clinician discussed coping skills and options for supporting himself through these challenging times. Pt reports on his maladaptive behaviors and thinking patterns over the past few weeks, which interferes with resolution of trauma symptoms 2/5 X within the past few weeks. 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  [name] asked open ended questions.  Pt continues to talk about his experiences in the [place], including his trauma-related his experiences, joining the [employer], getting out of the [employer], drinking too much, mother dying.. [name] processed some of his experiences while in the [employer] and after discharge.   Collaboration of Care: Other: Contact [facility] to help with VA disability paperwork   Patient/Guardian was advised Release of Information must be obtained prior to any record release in order to collaborate their care with an outside provider. Patient/Guardian was advised if they have not alreay done so to contact the registration department to sign all necessary forms in order for [place] to release information regarding their care.    Consent: Patient/Guardian gives verbal consent for treatment and assignment of benefits for services provided during this visit. Patient/Guardian expressed understanding and agreed to proceed.          Assessment and plan: Counselor will continue to meet with patient to address treatment plan goals. Patient will continue to follow recommendations of providers and implement skills learned in session.   Suicidal/Homicidal: Nowithout intent/plan   Therapist Response: Assessed pt's current functioning and reviewed progress.. Assisted pt processing stressors, negative thoughts.  Assisted pt processing for the management of her stressors.   Participation Level: Active  Diagnosis:  PTSD     Follow Up Instructions: I discussed the  assessment and treatment plan with the patient. The patient was provided an opportunity to ask questions and all were answered. The patient agreed with the plan and demonstrated an understanding of the instructions.   The patient was advised to call back or seek an in-person evaluation if the symptoms worsen or if the condition fails to improve as anticipated.   I provided 45 minutes of non-face-to-face time during this encounter.     Tyniesha Howald S, LCAS

## 2022-05-14 ENCOUNTER — Ambulatory Visit (HOSPITAL_COMMUNITY): Payer: Medicare PPO | Admitting: Licensed Clinical Social Worker

## 2022-05-16 ENCOUNTER — Ambulatory Visit (INDEPENDENT_AMBULATORY_CARE_PROVIDER_SITE_OTHER): Payer: Medicare PPO | Admitting: Licensed Clinical Social Worker

## 2022-05-16 DIAGNOSIS — F4310 Post-traumatic stress disorder, unspecified: Secondary | ICD-10-CM

## 2022-05-23 ENCOUNTER — Ambulatory Visit (INDEPENDENT_AMBULATORY_CARE_PROVIDER_SITE_OTHER): Payer: Medicare PPO | Admitting: Licensed Clinical Social Worker

## 2022-05-23 DIAGNOSIS — F4310 Post-traumatic stress disorder, unspecified: Secondary | ICD-10-CM

## 2022-05-30 ENCOUNTER — Ambulatory Visit (INDEPENDENT_AMBULATORY_CARE_PROVIDER_SITE_OTHER): Payer: Medicare PPO | Admitting: Licensed Clinical Social Worker

## 2022-05-30 DIAGNOSIS — F4310 Post-traumatic stress disorder, unspecified: Secondary | ICD-10-CM

## 2022-06-02 ENCOUNTER — Ambulatory Visit (INDEPENDENT_AMBULATORY_CARE_PROVIDER_SITE_OTHER): Payer: No Typology Code available for payment source | Admitting: Licensed Clinical Social Worker

## 2022-06-02 DIAGNOSIS — F4310 Post-traumatic stress disorder, unspecified: Secondary | ICD-10-CM

## 2022-06-09 ENCOUNTER — Ambulatory Visit (INDEPENDENT_AMBULATORY_CARE_PROVIDER_SITE_OTHER): Payer: No Typology Code available for payment source | Admitting: Licensed Clinical Social Worker

## 2022-06-09 DIAGNOSIS — F4310 Post-traumatic stress disorder, unspecified: Secondary | ICD-10-CM

## 2022-06-14 ENCOUNTER — Encounter (HOSPITAL_COMMUNITY): Payer: Self-pay | Admitting: Licensed Clinical Social Worker

## 2022-06-14 NOTE — Progress Notes (Signed)
Virtual Visit via Video Note   I connected with [employer] [place] on 05/16/22 at 10:00am by a video-enabled telemedicine application and verified that I am speaking with the correct person using two identifiers.   I discussed the limitations of evaluation and management by telemedicine and the availability of in person appointments. The patient expressed understanding and agreed to proceed.   LOCATION: Patient: Home Provider: Home Office   History of Present Illness: Pt was referred to therapy by the [facility] for PTSD.    Participation Level: Active     Type of Therapy: Virtual Individual therapy   Treatment Goal addressed: Pt will eliminate his maladaptive behaviors and thinking patterns which interfere with resolution of trauma by Cognitive Restructuring 3/5 X per week AEB self-report.   Progression towards Goal: Progressing   Interventions: CBT/Supportive/Psychoeducation   Summary: Patient presented for today's session on time and was alert, oriented x5, with no evidence or self-report of SI/HI or A/V H. Patient reported ongoing compliance with medication.  Clinician inquired about patient's current emotional ratings, as well as any significant changes in thoughts, feelings or behavior since previous session. Patient reported his emotional ratings 5/10 for depression, 5/10 for anxiety, 2/10 for anger/irritability. Pt reports on his moods this week and coping skills AEB by self report. "I continue to have a  a rough time emotionally but I'm doing better now. I've had quite a few health problems and have been to the [place]. My daughter is doing better physically. [name] and pt reviewed his feelings about his daughter and helped pt identify his feelings. Pt reports on his maladaptive behaviors and thinking patterns over the past week, which interferes with resolution of his trauma symptoms 2/5 X within the past week. 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  [name] asked open ended questions.  Again, pt continues to talk about his experiences in the [place], including his trauma-related experiences, joining the [employer], getting out of the [employer], drinking too much, mother dying... [name] processed some of his experiences while in the [employer] and after discharge, including his failed marriages and his decision to stop drinking.   Collaboration of Care: Other: Contact [employer] to help with VA disability paperwork   Patient/Guardian was advised Release of Information must be obtained prior to any record release in order to collaborate their care with an outside provider. Patient/Guardian was advised if they have not alreay done so to contact the registration department to sign all necessary forms in order for [place] to release information regarding their care.    Consent: Patient/Guardian gives verbal consent for treatment and assignment of benefits for services provided during this visit. Patient/Guardian expressed understanding and agreed to proceed.          Assessment and plan: Counselor will continue to meet with patient to address treatment plan goals. Patient will continue to follow recommendations of providers and implement skills learned in session.   Suicidal/Homicidal: Nowithout intent/plan   Therapist Response: Assessed pt's current functioning and reviewed progress.. Assisted pt processing stressors, negative thoughts.  Assisted pt processing for the management of her stressors.   Participation Level: Active   Diagnosis:  PTSD     Follow Up Instructions: I discussed the assessment and treatment plan with the patient. The patient was provided an opportunity to ask questions and all were answered. The patient agreed with the plan and demonstrated an understanding of the instructions.   The patient  was advised to call back or seek an in-person evaluation if the symptoms worsen or if the condition fails to  improve as anticipated.   I provided 30 minutes of non-face-to-face time during this encounter.     Carleigh Buccieri S, LCAS

## 2022-06-15 ENCOUNTER — Encounter (HOSPITAL_COMMUNITY): Payer: Self-pay | Admitting: Licensed Clinical Social Worker

## 2022-06-15 NOTE — Progress Notes (Signed)
Virtual Visit via Video Note   I connected with [employer] [place] on 05/23/22 at 10:00am by a video-enabled telemedicine application and verified that I am speaking with the correct person using two identifiers.   I discussed the limitations of evaluation and management by telemedicine and the availability of in person appointments. The patient expressed understanding and agreed to proceed.   LOCATION: Patient: Home Provider: Home Office   History of Present Illness: Pt was referred to therapy by the [facility] for PTSD.    Participation Level: Active     Type of Therapy: Virtual Individual therapy   Treatment Goal addressed: Pt will eliminate his maladaptive behaviors and thinking patterns which interfere with resolution of trauma by Cognitive Restructuring 3/5 X per week AEB self-report.   Progression towards Goal: Progressing   Interventions: CBT/Supportive/Psychoeducation   Summary: Patient presented for today's session on time and was alert, oriented x5, with no evidence or self-report of SI/HI or A/V H. Patient reported ongoing compliance with medication.  Clinician inquired about patient's current emotional ratings, as well as any significant changes in thoughts, feelings or behavior since previous session. Patient reported his emotional ratings 5/10 for depression, 5/10 for anxiety, 2/10 for anger/irritability. Pt reports on his moods this week and coping skills AEB by self report. "I continue to have a  a rough time emotionally but I'm doing better now. I've had quite a few health problems and have been to the [place]. My daughter is doing better physically." [name] used CBT to assist pt with his feelings about his daughter and helped pt identify his feelings. Pt reports on his maladaptive behaviors and thinking patterns over the past week, which interferes with resolution of his trauma symptoms 2/5 X within the past week. Pt provides an update on his veteran organizations, [employer], [employer]) and his behaviors in the meetings.  [name] asked open ended questions.  Again, pt continues to talk about his experiences in the [place], including his trauma-related experiences, joining the [employer], getting out of the [employer], drinking too much, mother dying... [name] processed some of his experiences while in the [employer] and after discharge, including his failed marriages and his decision to stop drinking.   Collaboration of Care: Other: Contact [employer] to help with VA disability paperwork   Patient/Guardian was advised Release of Information must be obtained prior to any record release in order to collaborate their care with an outside provider. Patient/Guardian was advised if they have not alreay done so to contact the registration department to sign all necessary forms in order for [place] to release information regarding their care.    Consent: Patient/Guardian gives verbal consent for treatment and assignment of benefits for services provided during this visit. Patient/Guardian expressed understanding and agreed to proceed.          Assessment and plan: Counselor will continue to meet with patient to address treatment plan goals. Patient will continue to follow recommendations of providers and implement skills learned in session.   Suicidal/Homicidal: Nowithout intent/plan   Therapist Response: Assessed pt's current functioning and reviewed progress.. Assisted pt processing stressors, negative thoughts.  Assisted pt processing for the management of her stressors.   Participation Level: Active   Diagnosis:  PTSD     Follow Up Instructions: I discussed the assessment and treatment plan with the patient. The patient was provided an opportunity to ask questions and all were answered. The patient agreed with the plan and demonstrated an understanding of the instructions.  The patient was advised to call back or seek an in-person evaluation if the symptoms worsen or if the  condition fails to improve as anticipated.   I provided 30 minutes of non-face-to-face time during this encounter.     Ezreal Turay S, LCAS       I provided 30 minutes of non-face-to-face time during this encounter.     Shakaya Bhullar S, LCAS

## 2022-06-16 ENCOUNTER — Ambulatory Visit (INDEPENDENT_AMBULATORY_CARE_PROVIDER_SITE_OTHER): Payer: Self-pay | Admitting: Licensed Clinical Social Worker

## 2022-06-16 DIAGNOSIS — F4310 Post-traumatic stress disorder, unspecified: Secondary | ICD-10-CM

## 2022-06-23 ENCOUNTER — Ambulatory Visit (HOSPITAL_COMMUNITY): Payer: No Typology Code available for payment source | Admitting: Licensed Clinical Social Worker

## 2022-06-28 ENCOUNTER — Encounter (HOSPITAL_COMMUNITY): Payer: Self-pay | Admitting: Licensed Clinical Social Worker

## 2022-06-28 NOTE — Progress Notes (Signed)
Virtual Visit via Video Note   I connected with [employer] [place] on 05/30/22 at 10:00am by a video-enabled telemedicine application and verified that I am speaking with the correct person using two identifiers.   I discussed the limitations of evaluation and management by telemedicine and the availability of in person appointments. The patient expressed understanding and agreed to proceed.   LOCATION: Patient: Home Provider: Home Office   History of Present Illness: Pt was referred to therapy by the [facility] for PTSD.    Participation Level: Active     Type of Therapy: Virtual Individual therapy   Treatment Goal addressed: Meet with clinician in person weekly for therapy to monitor for progress towards goals and address any barriers to success; Reduce depression from average severity level of 6/10 down to a 4/10 in next 90 days by engaging in 1-2 positive coping skills for daily as part of developing self-care routine; Reduce average anxiety level from 7/10 down to 5/10 in next 90 days by utilizing 3-5 relaxation skills/grounding skills per day, such as mindful breathing, progressive muscle relaxation, positive visualizations   Progression towards Goal: Progressing   Interventions: CBT/Supportive/Psychoeducation   Summary: Patient presented for today's session on time and was alert, oriented x5, with no evidence or self-report of SI/HI or A/V H. Patient reported ongoing compliance with medication.  Clinician inquired about patient's current emotional ratings, as well as any significant changes in thoughts, feelings or behavior since previous session. Patient reported his emotional ratings 5/10 for depression, 5/10 for anxiety, 2/10 for anger/irritability. Pt reports on his moods this week and coping skills AEB by self report. Pt provided an update on his health, appts at the [place], daughters post cancer health. [name] used CBT to assist pt with his feelings about his daughter and helped pt  identify his feelings. Pt reports on his maladaptive behaviors and thinking patterns over the past week, which interferes with resolution of his trauma symptoms 1/3 X within the past week. 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  [name] asked open ended questions.  Again, pt continues to talk about his experiences in the [place], including his trauma-related experiences, joining the [employer], getting out of the [employer], drinking too much, mother dying. [name] provided some updates the VA is extending to veterans through monetary asisstance. Pt and cln explored this information.Marland Kitchen   Collaboration of Care: Other: Contact [employer] to help with VA disability paperwork   Patient/Guardian was advised Release of Information must be obtained prior to any record release in order to collaborate their care with an outside provider. Patient/Guardian was advised if they have not alreay done so to contact the registration department to sign all necessary forms in order for [place] to release information regarding their care.    Consent: Patient/Guardian gives verbal consent for treatment and assignment of benefits for services provided during this visit. Patient/Guardian expressed understanding and agreed to proceed.          Assessment and plan: Counselor will continue to meet with patient to address treatment plan goals. Patient will continue to follow recommendations of providers and implement skills learned in session.   Suicidal/Homicidal: Nowithout intent/plan   Therapist Response: Assessed pt's current functioning and reviewed progress.. Assisted pt processing stressors, negative thoughts.  Assisted pt processing for the management of her stressors.   Participation Level: Active   Diagnosis:  PTSD     Follow Up Instructions: I discussed  the assessment and treatment plan with the patient. The patient was provided an opportunity to ask  questions and all were answered. The patient agreed with the plan and demonstrated an understanding of the instructions.   The patient was advised to call back or seek an in-person evaluation if the symptoms worsen or if the condition fails to improve as anticipated.   I provided 30 minutes of non-face-to-face time during this encounter.     Mazzie Brodrick S, LCAS       I provided 30 minutes of non-face-to-face time during this encounter.     Jamarien Rodkey S, LCAS

## 2022-06-30 ENCOUNTER — Encounter (HOSPITAL_COMMUNITY): Payer: Self-pay | Admitting: Licensed Clinical Social Worker

## 2022-06-30 ENCOUNTER — Ambulatory Visit (INDEPENDENT_AMBULATORY_CARE_PROVIDER_SITE_OTHER): Payer: No Typology Code available for payment source | Admitting: Licensed Clinical Social Worker

## 2022-06-30 DIAGNOSIS — F4310 Post-traumatic stress disorder, unspecified: Secondary | ICD-10-CM | POA: Diagnosis not present

## 2022-06-30 NOTE — Progress Notes (Signed)
Virtual Visit via Video Note   I connected with [employer] [place] on 06/30/22 at 3:00pm by a video-enabled telemedicine application and verified that I am speaking with the correct person using two identifiers.   I discussed the limitations of evaluation and management by telemedicine and the availability of in person appointments. The patient expressed understanding and agreed to proceed.   LOCATION: Patient: Home Provider: Home Office   History of Present Illness: Pt was referred to therapy by the [facility] for PTSD.    Participation Level: Active     Type of Therapy: Virtual Individual therapy   Treatment Goal addressed: Meet with clinician in person weekly for therapy to monitor for progress towards goals and address any barriers to success; Reduce depression from average severity level of 6/10 down to a 4/10 in next 90 days by engaging in 1-2 positive coping skills for daily as part of developing self-care routine; Reduce average anxiety level from 7/10 down to 5/10 in next 90 days by utilizing 3-5 relaxation skills/grounding skills per day, such as mindful breathing, progressive muscle relaxation, positive visualizations   Progression towards Goal: Progressing   Interventions: CBT/Supportive/Psychoeducation   Summary: Patient presented for today's session on time and was alert, oriented x5, with no evidence or self-report of SI/HI or A/V H. Patient reported ongoing compliance with medication.  Clinician inquired about patient's current emotional ratings, as well as any significant changes in thoughts, feelings or behavior since previous session. Patient reported his emotional ratings 5/10 for depression, 5/10 for anxiety, 2/10 for anger/irritability. Pt reports on his moods this week and coping skills AEB by self report. Pt provided an update on his health, appts at the [place], daughters post cancer health. [name] used CBT to assist pt with his feelings about his daughter and helped pt  identify his feelings. Pt reports on his maladaptive behaviors and thinking patterns over the past week, which interferes with resolution of his trauma symptoms 1/3 X within the past week. 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  [name] asked open ended questions. Pt asked his daughter to join [place] today because he's having video issues on his phone. Pt also received his authorization for continued community care services. His daughter scan/email them to me and I sent them to Gerlean Ren and Joya.   Again, pt continues to talk about his experiences in the [place], including his trauma-related experiences, joining the [employer], getting out of the [employer], drinking too much, mother dying, having children.    Collaboration of Care: Other: Contact [employer] to help with VA disability paperwork   Patient/Guardian was advised Release of Information must be obtained prior to any record release in order to collaborate their care with an outside provider. Patient/Guardian was advised if they have not alreay done so to contact the registration department to sign all necessary forms in order for [place] to release information regarding their care.    Consent: Patient/Guardian gives verbal consent for treatment and assignment of benefits for services provided during this visit. Patient/Guardian expressed understanding and agreed to proceed.          Assessment and plan: Counselor will continue to meet with patient to address treatment plan goals. Patient will continue to follow recommendations of providers and implement skills learned in session.   Suicidal/Homicidal: Nowithout intent/plan   Therapist Response: Assessed pt's current functioning and reviewed progress.. Assisted pt processing stressors, negative thoughts.  Assisted pt  processing for the management of her stressors.   Participation Level: Active   Diagnosis:  PTSD     Follow Up  Instructions: I discussed the assessment and treatment plan with the patient. The patient was provided an opportunity to ask questions and all were answered. The patient agreed with the plan and demonstrated an understanding of the instructions.   The patient was advised to call back or seek an in-person evaluation if the symptoms worsen or if the condition fails to improve as anticipated.   I provided 30 minutes of non-face-to-face time during this encounter.     Genova Kiner S, LCAS       I provided 60 minutes of non-face-to-face time during this encounter.     Charlisha Market S, LCAS

## 2022-07-06 ENCOUNTER — Encounter (HOSPITAL_COMMUNITY): Payer: Self-pay | Admitting: Licensed Clinical Social Worker

## 2022-07-06 NOTE — Progress Notes (Signed)
Virtual Visit via Video Note   I connected with [employer] [place] on 06/02/22 at 3:00pm by a video-enabled telemedicine application and verified that I am speaking with the correct person using two identifiers.   I discussed the limitations of evaluation and management by telemedicine and the availability of in person appointments. The patient expressed understanding and agreed to proceed.   LOCATION: Patient: Home Provider: Home Office   History of Present Illness: Pt was referred to therapy by the [facility] for PTSD.    Participation Level: Active     Type of Therapy: Virtual Individual therapy   Treatment Goal addressed: Meet with clinician in person weekly for therapy to monitor for progress towards goals and address any barriers to success; Reduce depression from average severity level of 6/10 down to a 4/10 in next 90 days by engaging in 1-2 positive coping skills for daily as part of developing self-care routine; Reduce average anxiety level from 7/10 down to 5/10 in next 90 days by utilizing 3-5 relaxation skills/grounding skills per day, such as mindful breathing, progressive muscle relaxation, positive visualizations   Progression towards Goal: Progressing   Interventions: CBT/Supportive/Psychoeducation   Summary: Patient presented for today's session on time and was alert, oriented x5, with no evidence or self-report of SI/HI or A/V H. Patient reported ongoing compliance with medication.  Clinician inquired about patient's current emotional ratings, as well as any significant changes in thoughts, feelings or behavior since previous session. Patient reported his emotional ratings 5/10 for depression, 5/10 for anxiety, 2/10 for anger/irritability. Pt reports on his moods this week and coping skills AEB by self report. Pt provided an update on his health, appts at the [place], daughters post cancer health.[name] assisted patient in a discussion on overcoming life's challenges, under  stress, which makes it more difficult to overcome obstacles in life. Patient was encouraged to identify life challenges and explore strategies for overcoming obstacles.   Pt reports on his maladaptive behaviors and thinking patterns over the past week, which interferes with resolution of his trauma symptoms 1/3 X within the past week. 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  [name] asked open ended questions.  PT continues to talk about his experiences in the [place], including his trauma-related experiences. [name] used CPT to assist pt with his trauma-related experiences.     Collaboration of Care: Other: Contact [employer] to help with VA disability paperwork   Patient/Guardian was advised Release of Information must be obtained prior to any record release in order to collaborate their care with an outside provider. Patient/Guardian was advised if they have not alreay done so to contact the registration department to sign all necessary forms in order for [place] to release information regarding their care.    Consent: Patient/Guardian gives verbal consent for treatment and assignment of benefits for services provided during this visit. Patient/Guardian expressed understanding and agreed to proceed.          Assessment and plan: Counselor will continue to meet with patient to address treatment plan goals. Patient will continue to follow recommendations of providers and implement skills learned in session.   Suicidal/Homicidal: Nowithout intent/plan   Therapist Response: Assessed pt's current functioning and reviewed progress.. Assisted pt processing stressors, negative thoughts.  Assisted pt processing for the management of her stressors.   Participation Level: Active   Diagnosis:  PTSD     Follow Up Instructions: I discussed the assessment and  treatment plan with the patient. The patient was provided an opportunity to ask  questions and all were answered. The patient agreed with the plan and demonstrated an understanding of the instructions.   The patient was advised to call back or seek an in-person evaluation if the symptoms worsen or if the condition fails to improve as anticipated.   I provided 30 minutes of non-face-to-face time during this encounter.     Scottie Metayer S, LCAS

## 2022-07-07 ENCOUNTER — Ambulatory Visit (HOSPITAL_COMMUNITY): Payer: Medicare PPO | Admitting: Licensed Clinical Social Worker

## 2022-07-14 ENCOUNTER — Encounter (HOSPITAL_COMMUNITY): Payer: Self-pay | Admitting: Licensed Clinical Social Worker

## 2022-07-14 ENCOUNTER — Ambulatory Visit (HOSPITAL_COMMUNITY): Payer: No Typology Code available for payment source | Admitting: Licensed Clinical Social Worker

## 2022-07-14 ENCOUNTER — Ambulatory Visit (INDEPENDENT_AMBULATORY_CARE_PROVIDER_SITE_OTHER): Payer: No Typology Code available for payment source | Admitting: Licensed Clinical Social Worker

## 2022-07-14 DIAGNOSIS — F4310 Post-traumatic stress disorder, unspecified: Secondary | ICD-10-CM

## 2022-07-14 NOTE — Progress Notes (Signed)
Virtual Visit via Video Note   I connected with [name] [place] on 07/14/22 at 10:00am by a video-enabled telemedicine application and verified that I am speaking with the correct person using two identifiers.   I discussed the limitations of evaluation and management by telemedicine and the availability of in person appointments. The patient expressed understanding and agreed to proceed.   LOCATION: Patient: Home Provider: Home Office   History of Present Illness: Pt was referred to therapy by the [facility] for PTSD.    Participation Level: Active     Type of Therapy: Virtual Individual therapy   Treatment Goal addressed: Meet with clinician in person weekly for therapy to monitor for progress towards goals and address any barriers to success; Reduce depression from average severity level of 6/10 down to a 4/10 in next 90 days by engaging in 1-2 positive coping skills for daily as part of developing self-care routine; Reduce average anxiety level from 7/10 down to 5/10 in next 90 days by utilizing 3-5 relaxation skills/grounding skills per day, such as mindful breathing, progressive muscle relaxation, positive visualizations   Progression towards Goal: Progressing   Interventions: CBT/Supportive/Psychoeducation   Summary: Patient presented for today's session on time and was alert, oriented x5, with no evidence or self-report of SI/HI or A/V H. Patient reported ongoing compliance with medication.  Clinician inquired about patient's current emotional ratings, as well as any significant changes in thoughts, feelings or behavior since previous session. Patient reported his emotional ratings 5/10 for depression, 5/10 for anxiety, 2/10 for anger/irritability. Pt reports on his moods this week and coping skills AEB by self report. Pt provided an update on his health, appts at the [place], daughters post cancer health. Pt reports on his maladaptive behaviors and thinking patterns over the past  week, which interferes with resolution of his trauma symptoms 1/3 X within the past week. Pt provides an update on his health issues and had an injection in his knee last week. "I don't won't surgery." [name] asked open ended questions about alternatives to knee surgery, trusting providers, asking for 2nd openings. pt reports he's having trouble sleeping which include his experiences in the [place], including his trauma-related experiences.    Collaboration of Care: Other: Contact [employer] to help with VA disability paperwork   Patient/Guardian was advised Release of Information must be obtained prior to any record release in order to collaborate their care with an outside provider. Patient/Guardian was advised if they have not alreay done so to contact the registration department to sign all necessary forms in order for [place] to release information regarding their care.    Consent: Patient/Guardian gives verbal consent for treatment and assignment of benefits for services provided during this visit. Patient/Guardian expressed understanding and agreed to proceed.          Assessment and plan: Counselor will continue to meet with patient to address treatment plan goals. Patient will continue to follow recommendations of providers and implement skills learned in session.   Suicidal/Homicidal: Nowithout intent/plan   Therapist Response: Assessed pt's current functioning and reviewed progress.. Assisted pt processing stressors, negative thoughts.  Assisted pt processing for the management of her stressors.   Participation Level: Active   Diagnosis:  PTSD     Follow Up Instructions: I discussed the assessment and treatment plan with the patient. The patient was provided an opportunity to ask questions and all were answered. The patient agreed with the plan and demonstrated an understanding of the instructions.  The patient was advised to call back or seek an in-person evaluation if the symptoms worsen or if  the condition fails to improve as anticipated.   I provided 45 minutes of non-face-to-face time during this encounter.     Keighley Deckman S, LCAS       I provided 60 minutes of non-face-to-face time during this encounter.     Lamari Beckles S, LCAS

## 2022-07-20 ENCOUNTER — Encounter (HOSPITAL_COMMUNITY): Payer: Self-pay | Admitting: Licensed Clinical Social Worker

## 2022-07-20 NOTE — Progress Notes (Signed)
Virtual Visit via Video Note   I connected with [employer] [place] on 1/122/24 at 3:00pm by a video-enabled telemedicine application and verified that I am speaking with the correct person using two identifiers.   I discussed the limitations of evaluation and management by telemedicine and the availability of in person appointments. The patient expressed understanding and agreed to proceed.   LOCATION: Patient: Home Provider: Home Office   History of Present Illness: Pt was referred to therapy by the [facility] for PTSD.    Participation Level: Active     Type of Therapy: Virtual Individual therapy   Treatment Goal addressed: Meet with clinician in person weekly for therapy to monitor for progress towards goals and address any barriers to success; Reduce depression from average severity level of 6/10 down to a 4/10 in next 90 days by engaging in 1-2 positive coping skills for daily as part of developing self-care routine; Reduce average anxiety level from 7/10 down to 5/10 in next 90 days by utilizing 3-5 relaxation skills/grounding skills per day, such as mindful breathing, progressive muscle relaxation, positive visualizations   Progression towards Goal: Progressing   Interventions: CBT/Supportive/Psychoeducation   Summary: Patient presented for today's session on time and was alert, oriented x5, with no evidence or self-report of SI/HI or A/V H. Patient reported ongoing compliance with medication.  Clinician inquired about patient's current emotional ratings, as well as any significant changes in thoughts, feelings or behavior since previous session. Patient reported his emotional ratings 5/10 for depression, 5/10 for anxiety, 2/10 for anger/irritability. Pt reports on his moods this week and coping skills AEB by self report. Pt provided an update on his health, appts at the [place], daughters post cancer health. Patient participated in a discussion on believing in yourself, having faith in  your own capabilities. When you believe in yourself, you can overcome self-doubt and have the confidence to take action and get things done.  Patient was encouraged to continue believing in himself.   Pt reports on his maladaptive behaviors and thinking patterns over the past week, which interferes with resolution of his trauma symptoms 1/3 X within the past week. 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  [name] asked open ended questions.  Pt continues to talk about his experiences in the [place], including his trauma-related experiences. Pt is concerned that his exposure to [place] has exacerbated his daughter having cancer. [name] and pt research [place] and encoraged pt to talk with his providers at the [place].     Collaboration of Care: Other: Contact [employer] to help with VA disability paperwork   Patient/Guardian was advised Release of Information must be obtained prior to any record release in order to collaborate their care with an outside provider. Patient/Guardian was advised if they have not alreay done so to contact the registration department to sign all necessary forms in order for [place] to release information regarding their care.    Consent: Patient/Guardian gives verbal consent for treatment and assignment of benefits for services provided during this visit. Patient/Guardian expressed understanding and agreed to proceed.          Assessment and plan: Counselor will continue to meet with patient to address treatment plan goals. Patient will continue to follow recommendations of providers and implement skills learned in session.   Suicidal/Homicidal: Nowithout intent/plan   Therapist Response: Assessed pt's current functioning and reviewed progress.. Assisted pt processing stressors, negative thoughts.  Assisted pt processing for the management of her stressors.   Participation Level: Active   Diagnosis:   PTSD     Follow Up Instructions: I discussed the assessment and treatment plan with the patient. The patient was provided an opportunity to ask questions and all were answered. The patient agreed with the plan and demonstrated an understanding of the instructions.   The patient was advised to call back or seek an in-person evaluation if the symptoms worsen or if the condition fails to improve as anticipated.   I provided 30 minutes of non-face-to-face time during this encounter.     Rosslyn Pasion S, LCAS

## 2022-07-21 ENCOUNTER — Ambulatory Visit (HOSPITAL_COMMUNITY): Payer: Medicare PPO | Admitting: Licensed Clinical Social Worker

## 2022-07-27 ENCOUNTER — Encounter (HOSPITAL_COMMUNITY): Payer: Self-pay | Admitting: Licensed Clinical Social Worker

## 2022-07-28 ENCOUNTER — Ambulatory Visit (INDEPENDENT_AMBULATORY_CARE_PROVIDER_SITE_OTHER): Payer: No Typology Code available for payment source | Admitting: Licensed Clinical Social Worker

## 2022-07-28 ENCOUNTER — Encounter (HOSPITAL_COMMUNITY): Payer: Self-pay | Admitting: Licensed Clinical Social Worker

## 2022-07-28 DIAGNOSIS — F4310 Post-traumatic stress disorder, unspecified: Secondary | ICD-10-CM | POA: Diagnosis not present

## 2022-07-28 NOTE — Progress Notes (Signed)
Virtual Visit via Video Note   I connected with [name] [place] on 07/28/22 at 10:00am by a video-enabled telemedicine application and verified that I am speaking with the correct person using two identifiers.   I discussed the limitations of evaluation and management by telemedicine and the availability of in person appointments. The patient expressed understanding and agreed to proceed.   LOCATION: Patient: Home Provider: Home Office   History of Present Illness: Pt was referred to therapy by the [facility] for PTSD.    Participation Level: Active     Type of Therapy: Virtual Individual therapy   Treatment Goal addressed: Meet with clinician in person weekly for therapy to monitor for progress towards goals and address any barriers to success; Reduce depression from average severity level of 6/10 down to a 4/10 in next 90 days by engaging in 1-2 positive coping skills for daily as part of developing self-care routine; Reduce average anxiety level from 7/10 down to 5/10 in next 90 days by utilizing 1-2 relaxation skills/grounding skills per day, such as mindful breathing, progressive muscle relaxation, positive visualizations   Progression towards Goal: Progressing   Interventions: CBT/Supportive/Psychoeducation   Summary: Patient presented for today's session on time and was alert, oriented x5, with no evidence or self-report of SI/HI or A/V H. Patient reported ongoing compliance with medication.  Clinician inquired about patient's current emotional ratings, as well as any significant changes in thoughts, feelings or behavior since previous session. Patient reported his emotional ratings 5/10 for depression, 5/10 for anxiety, 2/10 for anger/irritability. Pt reports on his moods this week and coping skills AEB by self report. Pt provided an update on his health, appts at the [place], daughters post cancer health. Pt reports on his maladaptive behaviors and thinking patterns over the past  week, which interferes with resolution of his trauma symptoms 1/3 X within the past week. Pt provides an update on his health issues and had an injection in his knee last week. 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  [name] asked open ended questions. Pt continues to talk about his experiences in the [place], including his trauma-related experiences, joining the [employer], getting out of the [employer], drinking too much, mother dying, having children.    Collaboration of Care: Other: Contact [employer] to help with VA disability paperwork   Patient/Guardian was advised Release of Information must be obtained prior to any record release in order to collaborate their care with an outside provider. Patient/Guardian was advised if they have not alreay done so to contact the registration department to sign all necessary forms in order for [place] to release information regarding their care.    Consent: Patient/Guardian gives verbal consent for treatment and assignment of benefits for services provided during this visit. Patient/Guardian expressed understanding and agreed to proceed.          Assessment and plan: Counselor will continue to meet with patient to address treatment plan goals. Patient will continue to follow recommendations of providers and implement skills learned in session.   Suicidal/Homicidal: Nowithout intent/plan   Therapist Response: Assessed pt's current functioning and reviewed progress.. Assisted pt processing stressors, negative thoughts.  Assisted pt processing for the management of her stressors.   Participation Level: Active   Diagnosis:  PTSD     Follow Up Instructions: I discussed the assessment and treatment plan with the patient. The patient was provided an opportunity to ask  questions and all were answered. The patient agreed with the plan and demonstrated an understanding of the  instructions.   The patient was advised to call back or seek an in-person evaluation if the symptoms worsen or if the condition fails to improve as anticipated.   I provided 30 minutes of non-face-to-face time during this encounter.     Denajah Farias S, LCAS       I provided 45 minutes of non-face-to-face time during this encounter.     Alvan Culpepper S, LCAS

## 2022-08-03 NOTE — Progress Notes (Signed)
Virtual Visit via Video Note   I connected with [employer] [place] on 06/16/22 at 3:00pm by a video-enabled telemedicine application and verified that I am speaking with the correct person using two identifiers.   I discussed the limitations of evaluation and management by telemedicine and the availability of in person appointments. The patient expressed understanding and agreed to proceed.   LOCATION: Patient: Home Provider: Home Office   History of Present Illness: Pt was referred to therapy by the [facility] for PTSD.    Participation Level: Active     Type of Therapy: Virtual Individual therapy   Treatment Goal addressed: Meet with clinician in person weekly for therapy to monitor for progress towards goals and address any barriers to success; Reduce depression from average severity level of 6/10 down to a 4/10 in next 90 days by engaging in 1-2 positive coping skills for daily as part of developing self-care routine; Reduce average anxiety level from 7/10 down to 5/10 in next 90 days by utilizing 3-5 relaxation skills/grounding skills per day, such as mindful breathing, progressive muscle relaxation, positive visualizations   Progression towards Goal: Progressing   Interventions: CBT/Supportive/Psychoeducation   Summary: Patient presented for today's session on time and was alert, oriented x5, with no evidence or self-report of SI/HI or A/V H. Patient reported ongoing compliance with medication.  Clinician inquired about patient's current emotional ratings, as well as any significant changes in thoughts, feelings or behavior since previous session. Patient reported his emotional ratings 5/10 for depression, 5/10 for anxiety, 2/10 for anger/irritability. Pt reports on his moods this week and coping skills AEB by self report. Pt provided an update on his health, appts at the [place], daughters post cancer health.  Pt is concerned because his providers at the [place] have diagnosed him with  diabetes. "I don't have diabetes." [name] asked open ended questions. [name] provided limited information on Type 2 diabetes but suggested he make a list of questions to ask his [place] provider, also suggested he ask [place] provider to send a referral for a dietician/nutritionist for help with meal planning. Pt reports on his maladaptive behaviors and thinking patterns over the past week, which interferes with resolution of his trauma symptoms 1/3 X within the past week. 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  [name] asked open ended questions.  Pt continues to talk about his experiences in the [place], including his trauma-related experiences. [name] provided education on exposure therapy to assist pt with exposure to anxiety provoking experiences.    Collaboration of Care: Other: Contact [employer] to help with VA disability paperwork   Patient/Guardian was advised Release of Information must be obtained prior to any record release in order to collaborate their care with an outside provider. Patient/Guardian was advised if they have not alreay done so to contact the registration department to sign all necessary forms in order for [place] to release information regarding their care.    Consent: Patient/Guardian gives verbal consent for treatment and assignment of benefits for services provided during this visit. Patient/Guardian expressed understanding and agreed to proceed.          Assessment and plan: Counselor will continue to meet with patient to address treatment plan goals. Patient will continue to follow recommendations of providers and implement skills learned in session.   Suicidal/Homicidal: Nowithout intent/plan   Therapist Response: Assessed pt's current functioning and reviewed progress.. Assisted pt processing stressors, negative thoughts.  Assisted pt processing for the management of her stressors.   Participation Level: Active    Diagnosis:  PTSD     Follow Up Instructions: I discussed the assessment and treatment plan with the patient. The patient was provided an opportunity to ask questions and all were answered. The patient agreed with the plan and demonstrated an understanding of the instructions.   The patient was advised to call back or seek an in-person evaluation if the symptoms worsen or if the condition fails to improve as anticipated.   I provided 30 minutes of non-face-to-face time during this encounter.     Judge Duque S, LCAS

## 2022-08-04 ENCOUNTER — Ambulatory Visit (HOSPITAL_COMMUNITY): Payer: Medicare PPO | Admitting: Licensed Clinical Social Worker

## 2022-08-11 ENCOUNTER — Ambulatory Visit (HOSPITAL_COMMUNITY): Payer: Medicare PPO | Admitting: Licensed Clinical Social Worker

## 2022-08-18 ENCOUNTER — Ambulatory Visit (INDEPENDENT_AMBULATORY_CARE_PROVIDER_SITE_OTHER): Payer: No Typology Code available for payment source | Admitting: Licensed Clinical Social Worker

## 2022-08-18 DIAGNOSIS — F4310 Post-traumatic stress disorder, unspecified: Secondary | ICD-10-CM | POA: Diagnosis not present

## 2022-08-26 ENCOUNTER — Ambulatory Visit (INDEPENDENT_AMBULATORY_CARE_PROVIDER_SITE_OTHER): Payer: Medicare PPO | Admitting: Licensed Clinical Social Worker

## 2022-08-26 ENCOUNTER — Encounter (HOSPITAL_COMMUNITY): Payer: Self-pay | Admitting: Licensed Clinical Social Worker

## 2022-08-26 DIAGNOSIS — F4310 Post-traumatic stress disorder, unspecified: Secondary | ICD-10-CM | POA: Diagnosis not present

## 2022-08-26 NOTE — Progress Notes (Signed)
Virtual Visit via Video Note   I connected with [name] [place] on 08/26/22 at 11:00 am by a video-enabled telemedicine application and verified that I am speaking with the correct person using two identifiers.   I discussed the limitations of evaluation and management by telemedicine and the availability of in person appointments. The patient expressed understanding and agreed to proceed.   LOCATION: Patient: Home Provider: Home Office   History of Present Illness: Pt was referred to therapy by the [facility] for PTSD.    Participation Level: Active     Type of Therapy: Virtual Individual therapy   Treatment Goal addressed: Meet with clinician in person weekly for therapy to monitor for progress towards goals and address any barriers to success; Reduce depression from average severity level of 6/10 down to a 4/10 in next 90 days by engaging in 1-2 positive coping skills for daily as part of developing self-care routine; Reduce average anxiety level from 7/10 down to 5/10 in next 90 days by utilizing 3-5 relaxation skills/grounding skills per day, such as mindful breathing, progressive muscle relaxation, positive visualizations   Progression towards Goal: Progressing   Interventions: CBT/Supportive/Psychoeducation   Summary: Patient presented for today's session on time and was alert, oriented x5, with no evidence or self-report of SI/HI or A/V H. Patient reported ongoing compliance with medication.  Clinician inquired about patient's current emotional ratings, as well as any significant changes in thoughts, feelings or behavior since previous session. Patient reported his emotional ratings 5/10 for depression, 5/10 for anxiety, 2/10 for anger/irritability. Pt reports on his moods this week and coping skills AEB by self report. Pt provided an update on his health, appts at the [place], daughters post cancer health. Clinician utilized [place] [employer] to reflect and summarize thoughts and feelings. Pt  reports on his maladaptive behaviors and thinking patterns over the past week, which interferes with resolution of his trauma symptoms 1/3 X within the past week. 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  [name] asked open ended questions. [name] and pt role played behavior in meetings, keeping opinions to self. [name] and pt discussed his health concerns: diabetes, kidney disease, HBP. "One dr tells me I have these health issues and another dr tells me I don't have them." Pt spoke to atty Deuterman about his disability.     Collaboration of Care: Other: Contact [employer] to help with VA disability paperwork   Patient/Guardian was advised Release of Information must be obtained prior to any record release in order to collaborate their care with an outside provider. Patient/Guardian was advised if they have not alreay done so to contact the registration department to sign all necessary forms in order for [place] to release information regarding their care.    Consent: Patient/Guardian gives verbal consent for treatment and assignment of benefits for services provided during this visit. Patient/Guardian expressed understanding and agreed to proceed.          Assessment and plan: Counselor will continue to meet with patient to address treatment plan goals. Patient will continue to follow recommendations of providers and implement skills learned in session.   Suicidal/Homicidal: Nowithout intent/plan   Therapist Response: Assessed pt's current functioning and reviewed progress.. Assisted pt processing stressors, negative thoughts.  Assisted pt processing for the management of her stressors.   Participation Level: Active   Diagnosis:  PTSD     Follow Up Instructions: I discussed the assessment and  treatment plan with the patient. The patient was provided an opportunity to ask questions and all were answered. The patient agreed with the  plan and demonstrated an understanding of the instructions.   The patient was advised to call back or seek an in-person evaluation if the symptoms worsen or if the condition fails to improve as anticipated.   I provided 45 minutes of non-face-to-face time during this encounter.     Zachariah Pavek S, LCAS  08/26/22

## 2022-09-01 ENCOUNTER — Encounter (HOSPITAL_COMMUNITY): Payer: Self-pay | Admitting: Licensed Clinical Social Worker

## 2022-09-01 ENCOUNTER — Ambulatory Visit (INDEPENDENT_AMBULATORY_CARE_PROVIDER_SITE_OTHER): Payer: Medicare PPO | Admitting: Licensed Clinical Social Worker

## 2022-09-01 DIAGNOSIS — F4310 Post-traumatic stress disorder, unspecified: Secondary | ICD-10-CM | POA: Diagnosis not present

## 2022-09-01 NOTE — Progress Notes (Signed)
Virtual Visit via Video Note   I connected with [name] Maddy on 09/01/22 at 10:00 am by a video-enabled telemedicine application and verified that I am speaking with the correct person using two identifiers.   I discussed the limitations of evaluation and management by telemedicine and the availability of in person appointments. The patient expressed understanding and agreed to proceed.   LOCATION: Patient: Home Provider: Home Office   History of Present Illness: Pt was referred to therapy by the [facility] for PTSD.    Participation Level: Active     Type of Therapy: Virtual Individual therapy   Treatment Goal addressed: Meet with clinician in person weekly for therapy to monitor for progress towards goals and address any barriers to success; Reduce depression from average severity level of 6/10 down to a 4/10 in next 90 days by engaging in 1-2 positive coping skills for daily as part of developing self-care routine; Reduce average anxiety level from 7/10 down to 5/10 in next 90 days by utilizing 3-5 relaxation skills/grounding skills per day, such as mindful breathing, progressive muscle relaxation, positive visualizations   Progression towards Goal: Progressing   Interventions: Supportive/Psychoeducation   Summary: Patient presented for today's session on time and was alert, oriented x5, with no evidence or self-report of SI/HI or A/V H. Patient reported ongoing compliance with medication.  Clinician inquired about patient's current emotional ratings, as well as any significant changes in thoughts, feelings or behavior since previous session. Patient reported his emotional ratings 5/10 for depression, 5/10 for anxiety, 2/10 for anger/irritability. Pt reports on his moods this week and coping skills AEB by self report. Pt provided an update on his health, appts at the [place], relationship. Pt reports on his maladaptive behaviors and thinking patterns over the past week, which  interferes with resolution of his trauma symptoms 1/3 X within the past week. 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  [name] asked open ended questions. [name] and pt discussed his health concerns: diabetes, kidney disease, HBP. "I have upcoming dr appts at the [facility] and I hope to have some answers then. The unknown about my health increases my anxiety and depression as well as  my ability to sleep. [name] assisted pt with making a list of questions to ask each dr.   [place]: Pt spoke to atty Deuterman about his disability.     Collaboration of Care: Other: Contact [employer] to help with VA disability paperwork   Patient/Guardian was advised Release of Information must be obtained prior to any record release in order to collaborate their care with an outside provider. Patient/Guardian was advised if they have not alreay done so to contact the registration department to sign all necessary forms in order for [place] to release information regarding their care.    Consent: Patient/Guardian gives verbal consent for treatment and assignment of benefits for services provided during this visit. Patient/Guardian expressed understanding and agreed to proceed.          Assessment and plan: Counselor will continue to meet with patient to address treatment plan goals. Patient will continue to follow recommendations of providers and implement skills learned in session.   Suicidal/Homicidal: Nowithout intent/plan   Therapist Response: Assessed pt's current functioning and reviewed progress.. Assisted pt processing stressors, negative thoughts.  Assisted pt processing for the management of her stressors.   Participation Level: Active   Diagnosis:  PTSD  Follow Up Instructions: I discussed the assessment and treatment plan with the patient. The patient was provided an opportunity to ask questions and all were answered. The patient agreed  with the plan and demonstrated an understanding of the instructions.   The patient was advised to call back or seek an in-person evaluation if the symptoms worsen or if the condition fails to improve as anticipated.   I provided 45 minutes of non-face-to-face time during this encounter.     Aireanna Luellen S, LCAS  09/01/22

## 2022-09-08 ENCOUNTER — Encounter (HOSPITAL_COMMUNITY): Payer: Self-pay | Admitting: Licensed Clinical Social Worker

## 2022-09-08 ENCOUNTER — Ambulatory Visit (INDEPENDENT_AMBULATORY_CARE_PROVIDER_SITE_OTHER): Payer: No Typology Code available for payment source | Admitting: Licensed Clinical Social Worker

## 2022-09-08 DIAGNOSIS — F4310 Post-traumatic stress disorder, unspecified: Secondary | ICD-10-CM | POA: Diagnosis not present

## 2022-09-08 NOTE — Progress Notes (Signed)
Virtual Visit via Video Note   I connected with [name] Sarff on 09/08/22 at 10:00 am by a video-enabled telemedicine application and verified that I am speaking with the correct person using two identifiers.   I discussed the limitations of evaluation and management by telemedicine and the availability of in person appointments. The patient expressed understanding and agreed to proceed.   LOCATION: Patient: Home Provider: Home Office   History of Present Illness: Pt was referred to therapy by the [facility] for PTSD.    Participation Level: Active     Type of Therapy: Virtual Individual therapy   Treatment Goal addressed: Meet with clinician in person weekly for therapy to monitor for progress towards goals and address any barriers to success; Reduce depression from average severity level of 6/10 down to a 4/10 in next 90 days by engaging in 1-2 positive coping skills for daily as part of developing self-care routine; Reduce average anxiety level from 7/10 down to 5/10 in next 90 days by utilizing 3-5 relaxation skills/grounding skills per day, such as mindful breathing, progressive muscle relaxation, positive visualizations   Progression towards Goal: Progressing   Interventions: CBT/Supportive/Psychoeducation   Summary: Patient presented for today's session on time and was alert, oriented x5, with no evidence or self-report of SI/HI or A/V H. Patient reported ongoing compliance with medication.  Clinician inquired about patient's current emotional ratings, as well as any significant changes in thoughts, feelings or behavior since previous session. Patient reported his emotional ratings 5/10 for depression, 5/10 for anxiety, 2/10 for anger/irritability. Pt reports on his moods this week and coping skills AEB by self report. Pt provided an update on his health, appts at the [place], daughters post cancer health. Pt reports on his maladaptive behaviors and thinking patterns over the past  week, which interferes with resolution of his trauma symptoms 1/4 X within the past week. 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 [name] asked open ended questions. Pt continued discussion on [place] war and his return home to a life of drinking. "When we arrived back to [place] we couldn't get out of the airport because people were throwing things at [place]." Clinician utilized [place] OARS to reflect and summarize thoughts and feelings.        Collaboration of Care: Other: Contact [facility] to help with VA disability paperwork   Patient/Guardian was advised Release of Information must be obtained prior to any record release in order to collaborate their care with an outside provider. Patient/Guardian was advised if they have not alreay done so to contact the registration department to sign all necessary forms in order for [place] to release information regarding their care.    Consent: Patient/Guardian gives verbal consent for treatment and assignment of benefits for services provided during this visit. Patient/Guardian expressed understanding and agreed to proceed.          Assessment and plan: Counselor will continue to meet with patient to address treatment plan goals. Patient will continue to follow recommendations of providers and implement skills learned in session.   Suicidal/Homicidal: Nowithout intent/plan   Therapist Response: Assessed pt's current functioning and reviewed progress.. Assisted pt processing stressors, negative thoughts.  Assisted pt processing for the management of her stressors.   Participation Level: Active   Diagnosis:  PTSD     Follow Up Instructions: I discussed the assessment and treatment plan with the patient. The patient was provided an opportunity to ask questions and all were answered.  The patient agreed with the plan and demonstrated an understanding of the instructions.   The patient was advised to call back or seek an  in-person evaluation if the symptoms worsen or if the condition fails to improve as anticipated.   I provided 45 minutes of non-face-to-face time during this encounter.     Loel Betancur S, LCAS  09/08/22

## 2022-09-15 ENCOUNTER — Encounter (HOSPITAL_COMMUNITY): Payer: Self-pay | Admitting: Licensed Clinical Social Worker

## 2022-09-15 ENCOUNTER — Ambulatory Visit (INDEPENDENT_AMBULATORY_CARE_PROVIDER_SITE_OTHER): Payer: Medicare PPO | Admitting: Licensed Clinical Social Worker

## 2022-09-15 DIAGNOSIS — F4310 Post-traumatic stress disorder, unspecified: Secondary | ICD-10-CM

## 2022-09-15 NOTE — Progress Notes (Signed)
Virtual Visit via Video Note   I connected with [name] Dilone on 09/15/22 at 10:00-10:45 am by a video-enabled telemedicine application and verified that I am speaking with the correct person using two identifiers.   I discussed the limitations of evaluation and management by telemedicine and the availability of in person appointments. The patient expressed understanding and agreed to proceed.   LOCATION: Patient: Home Provider: Home Office   History of Present Illness: Pt was referred to therapy by the [facility] for PTSD.    Participation Level: Active     Type of Therapy: Virtual Individual therapy   Treatment Goal addressed: Meet with clinician in person weekly for therapy to monitor for progress towards goals and address any barriers to success; Reduce depression from average severity level of 6/10 down to a 4/10 in next 90 days by engaging in 1-2 positive coping skills for daily as part of developing self-care routine; Reduce average anxiety level from 7/10 down to 5/10 in next 90 days by utilizing 3-5 relaxation skills/grounding skills per day, such as mindful breathing, progressive muscle relaxation, positive visualizations   Progression towards Goal: Progressing   Interventions: CBT/Supportive/Psychoeducation   Summary: Patient presented for today's session on time and was alert, oriented x5, with no evidence or self-report of SI/HI or A/V H. Patient reported ongoing compliance with medication.  Clinician inquired about patient's current emotional ratings, as well as any significant changes in thoughts, feelings or behavior since previous session. Patient reported his emotional ratings 6/10 for depression, 5/10 for anxiety, 2/10 for anger/irritability. Pt reports on his moods this week and coping skills AEB by self report. Pt provided an update on his health, appts at the [place], daughters post cancer health. Pt reports on his maladaptive behaviors and thinking patterns over the  past week, which interferes with resolution of his trauma symptoms 1/4 X within the past week. 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 [name] asked open ended questions. Pt continued discussion on [place] war and his return home. [name] and pt discussed the Proof[employer]." [name] asked open ended questions. Pt had several appts with drs at [facility]. Pt is going to have shoulder surgery on 8/8. [name] and pt discussed recovery needs after that surgery.  [name] asked open ended questions.     Collaboration of Care: Other: Contact [employer] to help with VA disability paperwork   Patient/Guardian was advised Release of Information must be obtained prior to any record release in order to collaborate their care with an outside provider. Patient/Guardian was advised if they have not alreay done so to contact the registration department to sign all necessary forms in order for [place] to release information regarding their care.    Consent: Patient/Guardian gives verbal consent for treatment and assignment of benefits for services provided during this visit. Patient/Guardian expressed understanding and agreed to proceed.          Assessment and plan: Counselor will continue to meet with patient to address treatment plan goals. Patient will continue to follow recommendations of providers and implement skills learned in session.   Suicidal/Homicidal: Nowithout intent/plan   Therapist Response: Assessed pt's current functioning and reviewed progress.. Assisted pt processing stressors, negative thoughts.  Assisted pt processing for the management of her stressors.   Participation Level: Active   Diagnosis:  PTSD     Follow Up Instructions: I discussed the assessment and treatment plan with the patient. The patient was provided an opportunity to ask  questions and all were answered. The patient agreed with the plan and demonstrated an understanding of the instructions.   The patient was  advised to call back or seek an in-person evaluation if the symptoms worsen or if the condition fails to improve as anticipated.   I provided 45 minutes of non-face-to-face time during this encounter.     Tonyetta Berko S, LCAS  09/08/22

## 2022-09-17 ENCOUNTER — Encounter (HOSPITAL_COMMUNITY): Payer: Self-pay | Admitting: Licensed Clinical Social Worker

## 2022-09-17 NOTE — Progress Notes (Signed)
Virtual Visit via Video Note   I connected with [name]. Gossett on 08/18/22 at 10:00am by a video-enabled telemedicine application and verified that I am speaking with the correct person using two identifiers.   I discussed the limitations of evaluation and management by telemedicine and the availability of in person appointments. The patient expressed understanding and agreed to proceed.   LOCATION: Patient: Home Provider: Home Office   History of Present Illness: Pt was referred to therapy by the [facility] for PTSD.    Participation Level: Active     Type of Therapy: Virtual Individual therapy   Treatment Goal addressed: Meet with clinician in person weekly for therapy to monitor for progress towards goals and address any barriers to success; Reduce depression from average severity level of 6/10 down to a 4/10 in next 90 days by engaging in 1-2 positive coping skills for daily as part of developing self-care routine; Reduce average anxiety level from 7/10 down to 5/10 in next 90 days by utilizing 1-2 relaxation skills/grounding skills per day, such as mindful breathing, progressive muscle relaxation, positive visualizations   Progression towards Goal: Progressing   Interventions: CBT/Supportive/Psychoeducation   Summary: Patient presented for today's session on time and was alert, oriented x5, with no evidence or self-report of SI/HI or A/V H. Patient reported ongoing compliance with medication.  Clinician inquired about patient's current emotional ratings, as well as any significant changes in thoughts, feelings or behavior since previous session. Patient reported his emotional ratings 5/10 for depression, 5/10 for anxiety, 2/10 for anger/irritability. Pt reports on his moods this week and coping skills AEB by self report. Pt provided an update on his health, appts at the [place], daughters post cancer health. Pt reports on his maladaptive behaviors and thinking patterns over the past  week, which interferes with resolution of his trauma symptoms 1/3 X within the past week. Pt provides an update on his health issues, family, relationship, and pain levels.Pt continued with his thoughts about war time during [place].   Collaboration of Care: Other: Contact [employer] to help with VA disability paperwork   Patient/Guardian was advised Release of Information must be obtained prior to any record release in order to collaborate their care with an outside provider. Patient/Guardian was advised if they have not alreay done so to contact the registration department to sign all necessary forms in order for [place] to release information regarding their care.    Consent: Patient/Guardian gives verbal consent for treatment and assignment of benefits for services provided during this visit. Patient/Guardian expressed understanding and agreed to proceed.          Assessment and plan: Counselor will continue to meet with patient to address treatment plan goals. Patient will continue to follow recommendations of providers and implement skills learned in session.   Suicidal/Homicidal: Nowithout intent/plan   Therapist Response: Assessed pt's current functioning and reviewed progress.. Assisted pt processing stressors, negative thoughts.  Assisted pt processing for the management of her stressors.   Participation Level: Active   Diagnosis:  PTSD     Follow Up Instructions: I discussed the assessment and treatment plan with the patient. The patient was provided an opportunity to ask questions and all were answered. The patient agreed with the plan and demonstrated an understanding of the instructions.   The patient was advised to call back or seek an in-person evaluation if the symptoms worsen or if the condition fails to improve as anticipated.   I provided 30 minutes of  non-face-to-face time during this encounter.     Curry Seefeldt S, LCAS       I provided 45 minutes of non-face-to-face  time during this encounter.     Darnesha Diloreto S, LCAS

## 2022-09-19 ENCOUNTER — Encounter (HOSPITAL_COMMUNITY): Payer: Self-pay | Admitting: Licensed Clinical Social Worker

## 2022-09-22 ENCOUNTER — Ambulatory Visit (INDEPENDENT_AMBULATORY_CARE_PROVIDER_SITE_OTHER): Payer: No Typology Code available for payment source | Admitting: Licensed Clinical Social Worker

## 2022-09-22 ENCOUNTER — Encounter (HOSPITAL_COMMUNITY): Payer: Self-pay | Admitting: Licensed Clinical Social Worker

## 2022-09-22 DIAGNOSIS — F4310 Post-traumatic stress disorder, unspecified: Secondary | ICD-10-CM

## 2022-09-22 NOTE — Progress Notes (Signed)
Virtual Visit via Video Note   I connected with [name] [place]ick on 09/22/22 at 12:00-12:30 am by a video-enabled telemedicine application and verified that I am speaking with the correct person using two identifiers.   I discussed the limitations of evaluation and management by telemedicine and the availability of in person appointments. The patient expressed understanding and agreed to proceed.   LOCATION: Patient: Home Provider: Home Office   History of Present Illness: Pt was referred to therapy by the [facility] for PTSD.    Participation Level: Active     Type of Therapy: Virtual Individual therapy   Treatment Goal addressed: Meet with clinician in person weekly for therapy to monitor for progress towards goals and address any barriers to success; Reduce depression from average severity level of 6/10 down to a 4/10 in next 90 days by engaging in 1-2 positive coping skills for daily as part of developing self-care routine; Reduce average anxiety level from 7/10 down to 5/10 in next 90 days by utilizing 3-5 relaxation skills/grounding skills per day, such as mindful breathing, progressive muscle relaxation, positive visualizations   Progression towards Goal: Progressing   Interventions: CBT/Supportive/Psychoeducation   Summary: Patient presented for today's session on time and was alert, oriented x5, with no evidence or self-report of SI/HI or A/V H. Patient reported ongoing compliance with medication.  Clinician inquired about patient's current emotional ratings, as well as any significant changes in thoughts, feelings or behavior since previous session. Patient reported his emotional ratings 6/10 for depression, 5/10 for anxiety, 2/10 for anger/irritability. Pt reports on his moods this week and coping skills AEB by self report. Pt provided an update on his health, appts at the [place], daughters post cancer health. Pt reports on his maladaptive behaviors and thinking patterns over the  past week, which interferes with resolution of his trauma symptoms 1/4 X within the past week.  Pt started a new iron medication and has had gasto symptoms . [name] and pt reviewed what pt can do for his gastro issues. [name] ended the the session early due due to pt being sick.     Collaboration of Care: Other: Contact [employer] to help with VA disability paperwork   Patient/Guardian was advised Release of Information must be obtained prior to any record release in order to collaborate their care with an outside provider. Patient/Guardian was advised if they have not alreay done so to contact the registration department to sign all necessary forms in order for [place] to release information regarding their care.    Consent: Patient/Guardian gives verbal consent for treatment and assignment of benefits for services provided during this visit. Patient/Guardian expressed understanding and agreed to proceed.          Assessment and plan: Counselor will continue to meet with patient to address treatment plan goals. Patient will continue to follow recommendations of providers and implement skills learned in session.   Suicidal/Homicidal: Nowithout intent/plan   Therapist Response: Assessed pt's current functioning and reviewed progress.. Assisted pt processing stressors, negative thoughts.  Assisted pt processing for the management of her stressors.   Participation Level: Active   Diagnosis:  PTSD     Follow Up Instructions: I discussed the assessment and treatment plan with the patient. The patient was provided an opportunity to ask questions and all were answered. The patient agreed with the plan and demonstrated an understanding of the instructions.   The patient was advised to call back or seek an in-person evaluation if the  symptoms worsen or if the condition fails to improve as anticipated.   I provided 30 minutes of non-face-to-face time during this encounter.     Zeniah Briney S, LCAS  09/22/22

## 2022-09-29 ENCOUNTER — Encounter (HOSPITAL_COMMUNITY): Payer: Self-pay | Admitting: Licensed Clinical Social Worker

## 2022-09-29 ENCOUNTER — Ambulatory Visit (INDEPENDENT_AMBULATORY_CARE_PROVIDER_SITE_OTHER): Payer: No Typology Code available for payment source | Admitting: Licensed Clinical Social Worker

## 2022-09-29 DIAGNOSIS — F4310 Post-traumatic stress disorder, unspecified: Secondary | ICD-10-CM | POA: Diagnosis not present

## 2022-09-29 NOTE — Progress Notes (Signed)
Virtual Visit via Video Note   I connected with [name] Batz on 09/29/22 at 10:00-11:00 am by a video-enabled telemedicine application and verified that I am speaking with the correct person using two identifiers.   I discussed the limitations of evaluation and management by telemedicine and the availability of in person appointments. The patient expressed understanding and agreed to proceed.   LOCATION: Patient: Home Provider: Home Office   History of Present Illness: Pt was referred to therapy by the [facility] for PTSD.    Participation Level: Active     Type of Therapy: Virtual Individual therapy   Treatment Goal addressed: Meet with clinician in person weekly for therapy to monitor for progress towards goals and address any barriers to success; Reduce depression from average severity level of 6/10 down to a 4/10 in next 90 days by engaging in 1-2 positive coping skills for daily as part of developing self-care routine; Reduce average anxiety level from 7/10 down to 5/10 in next 90 days by utilizing 3-5 relaxation skills/grounding skills per day, such as mindful breathing, progressive muscle relaxation, positive visualizations   Progression towards Goal: Progressing   Interventions: CBT/Supportive/Psychoeducation   Summary: Patient presented for today's session on time and was alert, oriented x5, with no evidence or self-report of SI/HI or A/V H. Patient reported ongoing compliance with medication.  Clinician inquired about patient's current emotional ratings, as well as any significant changes in thoughts, feelings or behavior since previous session. Patient reported his emotional ratings 6/10 for depression, 5/10 for anxiety, 2/10 for anger/irritability. Pt reports on his moods this week and coping skills AEB by self report. Pt provided an update on his health, appts at the [place], upcoming shoulder surgery. Pt reports on his maladaptive behaviors and thinking patterns over the  past week, which interferes with resolution of his trauma symptoms 1/4 X within the past week. [name] and pt reviewed his trauma symptoms over the past week. Pt has a girlfriend who "stirs up trouble" and this keeps pt's stress level high. Clinician engaged in discussion on identified positives and utilized MI, OARS to assess how these have impacted client's mood. Clinician allowed space for further processing of emotions and provided supportive statements throughout session.       Collaboration of Care: Other: Continue to see providers at [facility]   Patient/Guardian was advised Release of Information must be obtained prior to any record release in order to collaborate their care with an outside provider. Patient/Guardian was advised if they have not alreay done so to contact the registration department to sign all necessary forms in order for [place] to release information regarding their care.    Consent: Patient/Guardian gives verbal consent for treatment and assignment of benefits for services provided during this visit. Patient/Guardian expressed understanding and agreed to proceed.          Assessment and plan: Counselor will continue to meet with patient to address treatment plan goals. Patient will continue to follow recommendations of providers and implement skills learned in session.   Suicidal/Homicidal: Nowithout intent/plan   Therapist Response: Assessed pt's current functioning and reviewed progress.. Assisted pt processing stressors, negative thoughts.  Assisted pt processing for the management of her stressors.   Participation Level: Active   Diagnosis:  PTSD     Follow Up Instructions: I discussed the assessment and treatment plan with the patient. The patient was provided an opportunity to ask questions and all were answered. The patient agreed with the plan and demonstrated  an understanding of the instructions.   The patient was advised to call back or seek an in-person evaluation if  the symptoms worsen or if the condition fails to improve as anticipated.   I provided 60 minutes of non-face-to-face time during this encounter.     Shawnda Mauney S, LCAS  09/29/22

## 2022-10-07 ENCOUNTER — Encounter (HOSPITAL_COMMUNITY): Payer: Self-pay | Admitting: Licensed Clinical Social Worker

## 2022-10-07 ENCOUNTER — Ambulatory Visit (INDEPENDENT_AMBULATORY_CARE_PROVIDER_SITE_OTHER): Payer: Medicare PPO | Admitting: Licensed Clinical Social Worker

## 2022-10-07 DIAGNOSIS — F4310 Post-traumatic stress disorder, unspecified: Secondary | ICD-10-CM | POA: Diagnosis not present

## 2022-10-07 NOTE — Progress Notes (Signed)
Virtual Visit via Video Note   I connected with [name] Hambly on 10/07/22 at 2:00-2:45 pm by a video-enabled telemedicine application and verified that I am speaking with the correct person using two identifiers.   I discussed the limitations of evaluation and management by telemedicine and the availability of in person appointments. The patient expressed understanding and agreed to proceed.   LOCATION: Patient: Home Provider: Home Office   History of Present Illness: Pt was referred to therapy by the [facility] for PTSD.    Participation Level: Active     Type of Therapy: Virtual Individual therapy   Treatment Goal addressed: Meet with clinician in person weekly for therapy to monitor for progress towards goals and address any barriers to success; Reduce depression from average severity level of 6/10 down to a 4/10 in next 90 days by engaging in 1-2 positive coping skills for daily as part of developing self-care routine; Reduce average anxiety level from 7/10 down to 5/10 in next 90 days by utilizing 3-5 relaxation skills/grounding skills per day, such as mindful breathing, progressive muscle relaxation, positive visualizations   Progression towards Goal: Progressing   Interventions: CBT/Supportive/Psychoeducation   Summary: Patient presented for today's session on time and was alert, oriented x5, with no evidence or self-report of SI/HI or A/V H. Patient reported ongoing compliance with medication.  Clinician inquired about patient's current emotional ratings, as well as any significant changes in thoughts, feelings or behavior since previous session. Patient reported his emotional ratings 6/10 for depression, 5/10 for anxiety, 2/10 for anger/irritability. Pt reports on his moods this week and coping skills AEB by self report. Pt provided an update on his health, appts at the [place], upcoming shoulder surgery. Pt reports on his maladaptive behaviors and thinking patterns over the past  week, which interferes with resolution of his trauma symptoms 1/4 X within the past week. [name] and pt reviewed his trauma symptoms over the past week. [name] and pt reviewed memorial day, what it means to him now and before. Pt shared activities in [place] for [employer] day. [name] thanked pt for his service.         Collaboration of Care: Other: Continue to see providers at [facility]   Patient/Guardian was advised Release of Information must be obtained prior to any record release in order to collaborate their care with an outside provider. Patient/Guardian was advised if they have not alreay done so to contact the registration department to sign all necessary forms in order for [place] to release information regarding their care.    Consent: Patient/Guardian gives verbal consent for treatment and assignment of benefits for services provided during this visit. Patient/Guardian expressed understanding and agreed to proceed.          Assessment and plan: Counselor will continue to meet with patient to address treatment plan goals. Patient will continue to follow recommendations of providers and implement skills learned in session.   Suicidal/Homicidal: Nowithout intent/plan   Therapist Response: Assessed pt's current functioning and reviewed progress.. Assisted pt processing stressors, negative thoughts.  Assisted pt processing for the management of her stressors.   Participation Level: Active   Diagnosis:  PTSD     Follow Up Instructions: I discussed the assessment and treatment plan with the patient. The patient was provided an opportunity to ask questions and all were answered. The patient agreed with the plan and demonstrated an understanding of the instructions.   The patient was advised to call back or seek an  in-person evaluation if the symptoms worsen or if the condition fails to improve as anticipated.   I provided 45 minutes of non-face-to-face time during this encounter.      Dashawna Delbridge S, LCAS  10/07/22

## 2022-10-14 ENCOUNTER — Ambulatory Visit (HOSPITAL_COMMUNITY): Payer: Medicare PPO | Admitting: Licensed Clinical Social Worker

## 2022-10-15 ENCOUNTER — Encounter (HOSPITAL_COMMUNITY): Payer: Self-pay | Admitting: Licensed Clinical Social Worker

## 2022-10-15 ENCOUNTER — Ambulatory Visit (INDEPENDENT_AMBULATORY_CARE_PROVIDER_SITE_OTHER): Payer: No Typology Code available for payment source | Admitting: Licensed Clinical Social Worker

## 2022-10-15 DIAGNOSIS — F4310 Post-traumatic stress disorder, unspecified: Secondary | ICD-10-CM

## 2022-10-15 NOTE — Progress Notes (Signed)
Virtual Visit via Video Note   I connected with [name] Spraggins on 10/15/22 at 11-11:45am by a video-enabled telemedicine application and verified that I am speaking with the correct person using two identifiers.   I discussed the limitations of evaluation and management by telemedicine and the availability of in person appointments. The patient expressed understanding and agreed to proceed.   LOCATION: Patient: Home Provider: Home Office   History of Present Illness: Pt was referred to therapy by the [facility] for PTSD.    Participation Level: Active     Type of Therapy: Virtual Individual therapy   Treatment Goal addressed: Meet with clinician in person weekly for therapy to monitor for progress towards goals and address any barriers to success; Reduce depression from average severity level of 6/10 down to a 4/10 in next 90 days by engaging in 1-2 positive coping skills for daily as part of developing self-care routine; Reduce average anxiety level from 7/10 down to 5/10 in next 90 days by utilizing 3-5 relaxation skills/grounding skills per day, such as mindful breathing, progressive muscle relaxation, positive visualizations   Progression towards Goal: Progressing   Interventions: CBT/Supportive/Psychoeducation   Summary: Patient presented for today's session on time and was alert, oriented x5, with no evidence or self-report of SI/HI or A/V H. Patient reported ongoing compliance with medication.  Clinician inquired about patient's current emotional ratings, as well as any significant changes in thoughts, feelings or behavior since previous session. Patient reported his emotional ratings 6/10 for depression, 5/10 for anxiety, 2/10 for anger/irritability. Pt reports on his moods this week and coping skills AEB by self report. Pt provided an update on his health, appts at the [place], upcoming shoulder surgery. Pt reports on his maladaptive behaviors and thinking patterns over the past  week, which interferes with resolution of his trauma symptoms 1/4 X within the past week. [name] and pt reviewed his trauma symptoms over the past week. Pt shared activities he participated in [place] [employer] for [employer] day.          Collaboration of Care: Other: Continue to see providers at [facility]   Patient/Guardian was advised Release of Information must be obtained prior to any record release in order to collaborate their care with an outside provider. Patient/Guardian was advised if they have not alreay done so to contact the registration department to sign all necessary forms in order for [place] to release information regarding their care.    Consent: Patient/Guardian gives verbal consent for treatment and assignment of benefits for services provided during this visit. Patient/Guardian expressed understanding and agreed to proceed.          Assessment and plan: Counselor will continue to meet with patient to address treatment plan goals. Patient will continue to follow recommendations of providers and implement skills learned in session.   Suicidal/Homicidal: Nowithout intent/plan   Therapist Response: Assessed pt's current functioning and reviewed progress.. Assisted pt processing stressors, negative thoughts.  Assisted pt processing for the management of her stressors.   Participation Level: Active   Diagnosis:  PTSD     Follow Up Instructions: I discussed the assessment and treatment plan with the patient. The patient was provided an opportunity to ask questions and all were answered. The patient agreed with the plan and demonstrated an understanding of the instructions.   The patient was advised to call back or seek an in-person evaluation if the symptoms worsen or if the condition fails to improve as anticipated.   I  provided 45 minutes of non-face-to-face time during this encounter.     Romaldo Saville S, LCAS  10/15/22

## 2022-10-20 ENCOUNTER — Ambulatory Visit (INDEPENDENT_AMBULATORY_CARE_PROVIDER_SITE_OTHER): Payer: No Typology Code available for payment source | Admitting: Licensed Clinical Social Worker

## 2022-10-20 ENCOUNTER — Encounter (HOSPITAL_COMMUNITY): Payer: Self-pay | Admitting: Licensed Clinical Social Worker

## 2022-10-20 DIAGNOSIS — F4310 Post-traumatic stress disorder, unspecified: Secondary | ICD-10-CM | POA: Diagnosis not present

## 2022-10-20 NOTE — Progress Notes (Signed)
Virtual Visit via Video Note   I connected with [name] Jackson [name] on 10/20/22 at 10:00-11:00 by a video-enabled telemedicine application and verified that I am speaking with the correct person using two identifiers.   I discussed the limitations of evaluation and management by telemedicine and the availability of in person appointments. The patient expressed understanding and agreed to proceed.   LOCATION: Patient: Home Provider: Home Office   History of Present Illness: Pt was referred to therapy by the [facility] for PTSD.    Participation Level: Active     Type of Therapy: Virtual Individual therapy   Treatment Goal addressed: Meet with clinician in person weekly for therapy to monitor for progress towards goals and address any barriers to success; Reduce depression from average severity level of 6/10 down to a 4/10 in next 90 days by engaging in 1-2 positive coping skills for daily as part of developing self-care routine; Reduce average anxiety level from 7/10 down to 5/10 in next 90 days by utilizing 3-5 relaxation skills/grounding skills per day, such as mindful breathing, progressive muscle relaxation, positive visualizations   Progression towards Goal: Progressing   Interventions: CBT/Supportive/Psychoeducation   Summary: Patient presented for today's session on time and was alert, oriented x5, with no evidence or self-report of SI/HI or A/V H. Patient reported ongoing compliance with medication.  Clinician inquired about patient's current emotional ratings, as well as any significant changes in thoughts, feelings or behavior since previous session. Patient reported his emotional ratings 6/10 for depression, 5/10 for anxiety, 2/10 for anger/irritability. Pt reports on his moods this week and coping skills AEB by self report. Pt provided an update on his health, appts at the [place], upcoming shoulder surgery. Pt reports on his maladaptive behaviors and thinking patterns over the past  week, which interferes with resolution of his trauma symptoms 1/4 X within the past week. [name] and pt reviewed his trauma symptoms over the past week. [name] provided education on using mindfulness-based stress response to assist patient in lowering the stress response. Patient was encouraged to use mindfulness skills as a coping skill to assist in lowering stress response.            Collaboration of Care: Other: Continue to see providers at [facility]   Patient/Guardian was advised Release of Information must be obtained prior to any record release in order to collaborate their care with an outside provider. Patient/Guardian was advised if they have not alreay done so to contact the registration department to sign all necessary forms in order for [place] to release information regarding their care.    Consent: Patient/Guardian gives verbal consent for treatment and assignment of benefits for services provided during this visit. Patient/Guardian expressed understanding and agreed to proceed.          Assessment and plan: Counselor will continue to meet with patient to address treatment plan goals. Patient will continue to follow recommendations of providers and implement skills learned in session.   Suicidal/Homicidal: Nowithout intent/plan   Therapist Response: Assessed pt's current functioning and reviewed progress.. Assisted pt processing stressors, negative thoughts.  Assisted pt processing for the management of her stressors.   Participation Level: Active   Diagnosis:  PTSD     Follow Up Instructions: I discussed the assessment and treatment plan with the patient. The patient was provided an opportunity to ask questions and all were answered. The patient agreed with the plan and demonstrated an understanding of the instructions.   The patient was advised  to call back or seek an in-person evaluation if the symptoms worsen or if the condition fails to improve as anticipated.   I provided 60  minutes of non-face-to-face time during this encounter.     Lysandra Loughmiller S, LCAS  10/20/22

## 2022-10-27 ENCOUNTER — Encounter (HOSPITAL_COMMUNITY): Payer: Self-pay | Admitting: Licensed Clinical Social Worker

## 2022-10-27 ENCOUNTER — Ambulatory Visit (INDEPENDENT_AMBULATORY_CARE_PROVIDER_SITE_OTHER): Payer: No Typology Code available for payment source | Admitting: Licensed Clinical Social Worker

## 2022-10-27 DIAGNOSIS — F4310 Post-traumatic stress disorder, unspecified: Secondary | ICD-10-CM | POA: Diagnosis not present

## 2022-10-27 NOTE — Progress Notes (Signed)
Virtual Visit via Video Note   I connected with [name] Gajda on 10/27/22 at 10:00-10:45 by a video-enabled telemedicine application and verified that I am speaking with the correct person using two identifiers.   I discussed the limitations of evaluation and management by telemedicine and the availability of in person appointments. The patient expressed understanding and agreed to proceed.   LOCATION: Patient: Home Provider: Home Office   History of Present Illness: Pt was referred to therapy by the [facility] for PTSD.    Participation Level: Active     Type of Therapy: Virtual Individual therapy   Treatment Goal addressed: Meet with clinician in person weekly for therapy to monitor for progress towards goals and address any barriers to success; Reduce depression from average severity level of 6/10 down to a 4/10 in next 90 days by engaging in 1-2 positive coping skills for daily as part of developing self-care routine; Reduce average anxiety level from 7/10 down to 5/10 in next 90 days by utilizing 3-5 relaxation skills/grounding skills per day, such as mindful breathing, progressive muscle relaxation, positive visualizations   Progression towards Goal: Progressing   Interventions: CBT/Supportive/Psychoeducation   Summary: Patient presented for today's session on time and was alert, oriented x5, with no evidence or self-report of SI/HI or A/V H. Patient reported ongoing compliance with medication.  Clinician inquired about patient's current emotional ratings, as well as any significant changes in thoughts, feelings or behavior since previous session. Patient reported his emotional ratings 6/10 for depression, 5/10 for anxiety, 2/10 for anger/irritability. Pt reports on his moods this week and coping skills AEB by self report. Pt provided an update on his health, appts at the [place], upcoming shoulder surgery. Pt reports on his maladaptive behaviors and thinking patterns over the past  week, which interferes with resolution of his trauma symptoms 1/4 X within the past week. [name] and pt reviewed his trauma symptoms over the past week. I"I've been having trouble sleeping and when I do sleep, I have nightmares." [name] provided education on medications available for nightmares caused by flashbacks from the war. [name] provided a safe place for pt to talk about his flashbacks and suggested pt talked to his PCP about medication for sleep and nightmares.   Collaboration of Care: Other: Continue to see providers at [facility]   Patient/Guardian was advised Release of Information must be obtained prior to any record release in order to collaborate their care with an outside provider. Patient/Guardian was advised if they have not alreay done so to contact the registration department to sign all necessary forms in order for [place] to release information regarding their care.    Consent: Patient/Guardian gives verbal consent for treatment and assignment of benefits for services provided during this visit. Patient/Guardian expressed understanding and agreed to proceed.          Assessment and plan: Counselor will continue to meet with patient to address treatment plan goals. Patient will continue to follow recommendations of providers and implement skills learned in session.   Suicidal/Homicidal: Nowithout intent/plan   Therapist Response: Assessed pt's current functioning and reviewed progress.. Assisted pt processing stressors, negative thoughts.  Assisted pt processing for the management of her stressors.   Participation Level: Active   Diagnosis:  PTSD     Follow Up Instructions: I discussed the assessment and treatment plan with the patient. The patient was provided an opportunity to ask questions and all were answered. The patient agreed with the plan and demonstrated an  understanding of the instructions.   The patient was advised to call back or seek an in-person evaluation if the symptoms worsen  or if the condition fails to improve as anticipated.   I provided 45 minutes of non-face-to-face time during this encounter.     Iva Posten S, LCAS  10/27/22

## 2022-11-03 ENCOUNTER — Encounter (HOSPITAL_COMMUNITY): Payer: Self-pay | Admitting: Licensed Clinical Social Worker

## 2022-11-03 ENCOUNTER — Ambulatory Visit (INDEPENDENT_AMBULATORY_CARE_PROVIDER_SITE_OTHER): Payer: Medicare PPO | Admitting: Licensed Clinical Social Worker

## 2022-11-03 DIAGNOSIS — F4310 Post-traumatic stress disorder, unspecified: Secondary | ICD-10-CM | POA: Diagnosis not present

## 2022-11-03 NOTE — Progress Notes (Signed)
Virtual Visit via Video Note   I connected with [name] Paredez on 11/03/22 at 10:00-10:45 by a video-enabled telemedicine application and verified that I am speaking with the correct person using two identifiers.   I discussed the limitations of evaluation and management by telemedicine and the availability of in person appointments. The patient expressed understanding and agreed to proceed.   LOCATION: Patient: Home Provider: Home Office   History of Present Illness: Pt was referred to therapy by the [facility] for PTSD.    Participation Level: Active     Type of Therapy: Virtual Individual therapy   Treatment Goal addressed: Meet with clinician in person weekly for therapy to monitor for progress towards goals and address any barriers to success; Reduce depression from average severity level of 6/10 down to a 4/10 in next 90 days by engaging in 1-2 positive coping skills for daily as part of developing self-care routine; Reduce average anxiety level from 7/10 down to 5/10 in next 90 days by utilizing 3-5 relaxation skills/grounding skills per day, such as mindful breathing, progressive muscle relaxation, positive visualizations   Progression towards Goal: Progressing   Interventions: CBT/Supportive/Psychoeducation   Summary: Patient presented for today's session on time and was alert, oriented x5, with no evidence or self-report of SI/HI or A/V H. Patient reported ongoing compliance with medication.  Clinician inquired about patient's current emotional ratings, as well as any significant changes in thoughts, feelings or behavior since previous session. Patient reported his emotional ratings 6/10 for depression, 5/10 for anxiety, 2/10 for anger/irritability. Pt reports on his moods this week and coping skills AEB by self report. Pt provided an update on his health, appts at the [place], upcoming shoulder surgery. Pt reports on his maladaptive behaviors and thinking patterns over the past  week, which interferes with resolution of his trauma symptoms 1/4 X within the past week. [name] and pt reviewed his trauma symptoms over the past week. I continue to have problems sleeping.I still have not met with my PCP about medication. I met another veteran who sells insurance and he's going to review my supplemental insurance to see if I should just start using my Medicare, instead of [place]." Again, [name] provided education on medications available for nightmares caused by flashbacks from the war.   Collaboration of Care: Other: Continue to see providers at [facility]   Patient/Guardian was advised Release of Information must be obtained prior to any record release in order to collaborate their care with an outside provider. Patient/Guardian was advised if they have not alreay done so to contact the registration department to sign all necessary forms in order for [place] to release information regarding their care.    Consent: Patient/Guardian gives verbal consent for treatment and assignment of benefits for services provided during this visit. Patient/Guardian expressed understanding and agreed to proceed.          Assessment and plan: Counselor will continue to meet with patient to address treatment plan goals. Patient will continue to follow recommendations of providers and implement skills learned in session.   Suicidal/Homicidal: Nowithout intent/plan   Therapist Response: Assessed pt's current functioning and reviewed progress.. Assisted pt processing stressors, negative thoughts.  Assisted pt processing for the management of her stressors.   Participation Level: Active   Diagnosis:  PTSD     Follow Up Instructions: I discussed the assessment and treatment plan with the patient. The patient was provided an opportunity to ask questions and all were answered. The patient agreed  with the plan and demonstrated an understanding of the instructions.   The patient was advised to call back or seek an  in-person evaluation if the symptoms worsen or if the condition fails to improve as anticipated.   I provided 45 minutes of non-face-to-face time during this encounter.     Cliffie Gingras S, LCAS  11/03/22

## 2022-11-12 ENCOUNTER — Encounter (HOSPITAL_COMMUNITY): Payer: Self-pay | Admitting: Licensed Clinical Social Worker

## 2022-11-12 ENCOUNTER — Ambulatory Visit (INDEPENDENT_AMBULATORY_CARE_PROVIDER_SITE_OTHER): Payer: No Typology Code available for payment source | Admitting: Licensed Clinical Social Worker

## 2022-11-12 DIAGNOSIS — F4310 Post-traumatic stress disorder, unspecified: Secondary | ICD-10-CM

## 2022-11-12 NOTE — Progress Notes (Signed)
Virtual Visit via Video Note   I connected with Jackson [name]. Lorenson on 11/12/22 at 12:00-12:45 by a video-enabled telemedicine application and verified that I am speaking with the correct person using two identifiers.   I discussed the limitations of evaluation and management by telemedicine and the availability of in person appointments. The patient expressed understanding and agreed to proceed.   LOCATION: Patient: Home Provider: Home Office   History of Present Illness: Pt was referred to therapy by the [facility] for PTSD.    Participation Level: Active     Type of Therapy: Virtual Individual therapy   Treatment Goal addressed: Meet with clinician in person weekly for therapy to monitor for progress towards goals and address any barriers to success; Reduce depression from average severity level of 6/10 down to a 4/10 in next 90 days by engaging in 1-2 positive coping skills for daily as part of developing self-care routine; Reduce average anxiety level from 7/10 down to 5/10 in next 90 days by utilizing 3-5 relaxation skills/grounding skills per day, such as mindful breathing, progressive muscle relaxation, positive visualizations   Progression towards Goal: Progressing   Interventions: CBT/Supportive/Psychoeducation   Summary: Patient presented for today's session on time and was alert, oriented x5, with no evidence or self-report of SI/HI or A/V H. Patient reported ongoing compliance with medication.  Clinician inquired about patient's current emotional ratings, as well as any significant changes in thoughts, feelings or behavior since previous session. Patient reported his emotional ratings 6/10 for depression, 5/10 for anxiety, 2/10 for anger/irritability. Pt reports on his moods this week and coping skills AEB by self report. Pt provided an update on his health, appts at the [place], upcoming shoulder surgery. Pt reports on his maladaptive behaviors and thinking patterns over the past  week, which interferes with resolution of his trauma symptoms 1/4 X within the past week. [name] and pt reviewed his trauma symptoms over the past week. I continue to have problems sleeping.I still have not met with my PCP about medication. I met another veteran who sells insurance and he's going to review my supplemental insurance to see if I should just start using my Medicare, instead of [place].next week." Again, [name] provided education on medications available for nightmares caused by flashbacks from the war. Pt met with the surgeion who's gonna do his surgery and he explained the surgery and recovery time. [name] and pt reviewed a plan because he will need help, proablem-solving.  Collaboration of Care: Other: Continue to see providers at [facility]   Patient/Guardian was advised Release of Information must be obtained prior to any record release in order to collaborate their care with an outside provider. Patient/Guardian was advised if they have not alreay done so to contact the registration department to sign all necessary forms in order for [place] to release information regarding their care.    Consent: Patient/Guardian gives verbal consent for treatment and assignment of benefits for services provided during this visit. Patient/Guardian expressed understanding and agreed to proceed.          Assessment and plan: Counselor will continue to meet with patient to address treatment plan goals. Patient will continue to follow recommendations of providers and implement skills learned in session.   Suicidal/Homicidal: Nowithout intent/plan   Therapist Response: Assessed pt's current functioning and reviewed progress.. Assisted pt processing stressors, negative thoughts.  Assisted pt processing for the management of her stressors.   Participation Level: Active   Diagnosis:  PTSD  Follow Up Instructions: I discussed the assessment and treatment plan with the patient. The patient was provided an opportunity to ask  questions and all were answered. The patient agreed with the plan and demonstrated an understanding of the instructions.   The patient was advised to call back or seek an in-person evaluation if the symptoms worsen or if the condition fails to improve as anticipated.   I provided 45 minutes of non-face-to-face time during this encounter.     Jackson [name] S, LCAS  11/12/22

## 2022-11-18 ENCOUNTER — Ambulatory Visit (INDEPENDENT_AMBULATORY_CARE_PROVIDER_SITE_OTHER): Payer: No Typology Code available for payment source | Admitting: Licensed Clinical Social Worker

## 2022-11-18 DIAGNOSIS — F4310 Post-traumatic stress disorder, unspecified: Secondary | ICD-10-CM

## 2022-11-24 ENCOUNTER — Encounter (HOSPITAL_COMMUNITY): Payer: Self-pay | Admitting: Licensed Clinical Social Worker

## 2022-11-24 ENCOUNTER — Ambulatory Visit (INDEPENDENT_AMBULATORY_CARE_PROVIDER_SITE_OTHER): Payer: Medicare PPO | Admitting: Licensed Clinical Social Worker

## 2022-11-24 DIAGNOSIS — F4310 Post-traumatic stress disorder, unspecified: Secondary | ICD-10-CM

## 2022-11-24 NOTE — Progress Notes (Signed)
Virtual Visit via Video Note   I connected with [name] Castellanos on 11/24/22 at 10-10:45 by a video-enabled telemedicine application and verified that I am speaking with the correct person using two identifiers.   I discussed the limitations of evaluation and management by telemedicine and the availability of in person appointments. The patient expressed understanding and agreed to proceed.   LOCATION: Patient: Home Provider: Home Office   History of Present Illness: Pt was referred to therapy by the [facility] for PTSD.    Participation Level: Active     Type of Therapy: Virtual Individual therapy   Treatment Goal addressed: Meet with clinician in person weekly for therapy to monitor for progress towards goals and address any barriers to success; Reduce depression from average severity level of 6/10 down to a 4/10 in next 90 days by engaging in 1-2 positive coping skills for daily as part of developing self-care routine; Reduce average anxiety level from 7/10 down to 5/10 in next 90 days by utilizing 3-5 relaxation skills/grounding skills per day, such as mindful breathing, progressive muscle relaxation, positive visualizations   Progression towards Goal: Progressing   Interventions: CBT/Supportive/Psychoeducation   Summary: Patient presented for today's session on time and was alert, oriented x5, with no evidence or self-report of SI/HI or A/V H. Patient reported ongoing compliance with medication.  Clinician inquired about patient's current emotional ratings, as well as any significant changes in thoughts, feelings or behavior since previous session. Patient reported his emotional ratings 6/10 for depression, 6/10 for anxiety, 5/10 for anger/irritability. Pt reports on his moods this week and coping skills AEB by self report. Pt provided an update on his health, appts at the [place], upcoming shoulder surgery. 4th July holiday. Pt reports on his maladaptive behaviors and thinking patterns  over the past week, which interferes with resolution of his trauma symptoms 1/4 X within the past week. [name] and pt reviewed his trauma symptoms over the past week. I continue to have problems sleeping.I still have not met with my PCP about medication.The new dr starts next week/ Encouraged pt to speak with provider at [facility] about medications available for nightmares caused by flashbacks from the war. "My anxiety levels have increased due to the upcoming shoulder surgery." [name] provided education on the cycle of fear and anxiety. "I'm more concerned about my dogs than my surgery." [name] validated his concerns.  Collaboration of Care: Other: Continue to see providers at [facility]   Patient/Guardian was advised Release of Information must be obtained prior to any record release in order to collaborate their care with an outside provider. Patient/Guardian was advised if they have not alreay done so to contact the registration department to sign all necessary forms in order for [place] to release information regarding their care.    Consent: Patient/Guardian gives verbal consent for treatment and assignment of benefits for services provided during this visit. Patient/Guardian expressed understanding and agreed to proceed.          Assessment and plan: Counselor will continue to meet with patient to address treatment plan goals. Patient will continue to follow recommendations of providers and implement skills learned in session.   Suicidal/Homicidal: Nowithout intent/plan   Therapist Response: Assessed pt's current functioning and reviewed progress.. Assisted pt processing stressors, negative thoughts.  Assisted pt processing for the management of his stressors.   Participation Level: Active   Diagnosis:  PTSD     Follow Up Instructions: I discussed the assessment and treatment plan with the  patient. The patient was provided an opportunity to ask questions and all were answered. The patient agreed with the plan and  demonstrated an understanding of the instructions.   The patient was advised to call back or seek an in-person evaluation if the symptoms worsen or if the condition fails to improve as anticipated.   I provided 45 minutes of non-face-to-face time during this encounter.     Abbigaile Rockman S, LCAS  78/24

## 2022-12-01 ENCOUNTER — Ambulatory Visit (INDEPENDENT_AMBULATORY_CARE_PROVIDER_SITE_OTHER): Payer: No Typology Code available for payment source | Admitting: Licensed Clinical Social Worker

## 2022-12-01 DIAGNOSIS — F4310 Post-traumatic stress disorder, unspecified: Secondary | ICD-10-CM

## 2022-12-03 ENCOUNTER — Encounter (HOSPITAL_COMMUNITY): Payer: Self-pay | Admitting: Licensed Clinical Social Worker

## 2022-12-03 NOTE — Progress Notes (Signed)
Virtual Visit via Video Note   I connected with [name] Ebling on 11/18/22 at 2:00-2:45 by a video-enabled telemedicine application and verified that I am speaking with the correct person using two identifiers.   I discussed the limitations of evaluation and management by telemedicine and the availability of in person appointments. The patient expressed understanding and agreed to proceed.   LOCATION: Patient: Home Provider: Home Office   History of Present Illness: Pt was referred to therapy by the [facility] for PTSD.    Participation Level: Active     Type of Therapy: Virtual Individual therapy   Treatment Goal addressed: Meet with clinician in person weekly for therapy to monitor for progress towards goals and address any barriers to success; Reduce depression from average severity level of 6/10 down to a 4/10 in next 90 days by engaging in 1-2 positive coping skills for daily as part of developing self-care routine; Reduce average anxiety level from 7/10 down to 5/10 in next 90 days by utilizing 3-5 relaxation skills/grounding skills per day, such as mindful breathing, progressive muscle relaxation, positive visualizations   Progression towards Goal: Progressing   Interventions: CBT/Supportive/Psychoeducation   Summary: Patient presented for today's session on time and was alert, oriented x5, with no evidence or self-report of SI/HI or A/V H. Patient reported ongoing compliance with medication.  Clinician inquired about patient's current emotional ratings, as well as any significant changes in thoughts, feelings or behavior since previous session. Patient reported his emotional ratings 6/10 for depression, 5/10 for anxiety, 2/10 for anger/irritability. Pt reports on his moods this week and coping skills AEB by self report. Pt provided an update on his health, appts at the [place], upcoming shoulder surgery. Pt reports on his maladaptive behaviors and thinking patterns over the past  week, which interferes with resolution of his trauma symptoms 1/4 X within the past week. [name] and pt reviewed his trauma symptoms over the past week. "I'm concerned about my upcoming surgery, my feelings of helplessness that I won't be able to take care of myself, I don't really have a lot of people who can take care of me, I'm concerned about my pets."[name] engaged patient in discussion on identified positives to assess how her mood has been impacted. Clinician allowed space for pt to further process emotions and clinician provided supportive statements throughout session.. [name] suggested to patient continue to identify positives that  impact her mood. Pt learned how to recognize the physical, cognitive, emotional, and behavioral responses.    Collaboration of Care: Other: Continue to see providers at [facility]   Patient/Guardian was advised Release of Information must be obtained prior to any record release in order to collaborate their care with an outside provider. Patient/Guardian was advised if they have not alreay done so to contact the registration department to sign all necessary forms in order for [place] to release information regarding their care.    Consent: Patient/Guardian gives verbal consent for treatment and assignment of benefits for services provided during this visit. Patient/Guardian expressed understanding and agreed to proceed.          Assessment and plan: Counselor will continue to meet with patient to address treatment plan goals. Patient will continue to follow recommendations of providers and implement skills learned in session.   Suicidal/Homicidal: Nowithout intent/plan   Therapist Response: Assessed pt's current functioning and reviewed progress.. Assisted pt processing stressors, negative thoughts.  Assisted pt processing for the management of her stressors.   Participation Level: Active  Diagnosis:  PTSD     Follow Up Instructions: I discussed the assessment and treatment  plan with the patient. The patient was provided an opportunity to ask questions and all were answered. The patient agreed with the plan and demonstrated an understanding of the instructions.   The patient was advised to call back or seek an in-person evaluation if the symptoms worsen or if the condition fails to improve as anticipated.   I provided 45 minutes of non-face-to-face time during this encounter.     Zoriyah Scheidegger S, LCAS  11/18/22

## 2022-12-06 ENCOUNTER — Encounter (HOSPITAL_COMMUNITY): Payer: Self-pay | Admitting: Licensed Clinical Social Worker

## 2022-12-06 NOTE — Progress Notes (Signed)
Virtual Visit via Video Note   I connected with Jackson [name]. Criger on 12/01/22 at 10-10:45 by a video-enabled telemedicine application and verified that I am speaking with the correct person using two identifiers.   I discussed the limitations of evaluation and management by telemedicine and the availability of in person appointments. The patient expressed understanding and agreed to proceed.   LOCATION: Patient: Home Provider: Home Office   History of Present Illness: Pt was referred to therapy by the [facility] for PTSD.    Participation Level: Active     Type of Therapy: Virtual Individual therapy   Treatment Goal addressed: Meet with clinician in person weekly for therapy to monitor for progress towards goals and address any barriers to success; Reduce depression from average severity level of 6/10 down to a 4/10 in next 90 days by engaging in 1-2 positive coping skills for daily as part of developing self-care routine; Reduce average anxiety level from 7/10 down to 5/10 in next 90 days by utilizing 3-5 relaxation skills/grounding skills per day, such as mindful breathing, progressive muscle relaxation, positive visualizations   Progression towards Goal: Progressing   Interventions: CBT/Supportive/Psychoeducation   Summary: Patient presented for today's session on time and was alert, oriented x5, with no evidence or self-report of SI/HI or A/V H. Patient reported ongoing compliance with medication.  Clinician inquired about patient's current emotional ratings, as well as any significant changes in thoughts, feelings or behavior since previous session. Patient reported his emotional ratings 6/10 for depression, 6/10 for anxiety, 5/10 for anger/irritability. Pt reports on his moods this week and coping skills AEB by self report. Pt provided an update on his health, appts at the [place], upcoming shoulder surgery., daughter's health, disability benefits. Pt reports on his maladaptive behaviors  and thinking patterns over the past week, which interferes with resolution of his trauma symptoms 1/4 X within the past week. [name] and pt reviewed his trauma symptoms over the past week. I continue to have problems sleeping.I still have not met with my PCP about medication.The new dr starts next week/ Encouraged pt to speak with provider at [facility] about medications available for nightmares caused by flashbacks from the war. "My anxiety levels continue to increase due to the upcoming shoulder surgery and his inability to care for himself and having limited support. Clinician provided thought stopping tools, as well as reality testing to provide  assistance for pt to distiguish between fear and anxiety. Clinician utilized CBT to address thought processes support and confidence in his decision to have the surgery, ask for and accept help.     Collaboration of Care: Other: Continue to see providers at [facility]   Patient/Guardian was advised Release of Information must be obtained prior to any record release in order to collaborate their care with an outside provider. Patient/Guardian was advised if they have not alreay done so to contact the registration department to sign all necessary forms in order for [place] to release information regarding their care.    Consent: Patient/Guardian gives verbal consent for treatment and assignment of benefits for services provided during this visit. Patient/Guardian expressed understanding and agreed to proceed.          Assessment and plan: Counselor will continue to meet with patient to address treatment plan goals. Patient will continue to follow recommendations of providers and implement skills learned in session.   Suicidal/Homicidal: Nowithout intent/plan   Therapist Response: Assessed pt's current functioning and reviewed progress.. Assisted pt processing stressors, negative  thoughts.  Assisted pt processing for the management of his stressors.   Participation Level:  Active   Diagnosis:  PTSD     Follow Up Instructions: I discussed the assessment and treatment plan with the patient. The patient was provided an opportunity to ask questions and all were answered. The patient agreed with the plan and demonstrated an understanding of the instructions.   The patient was advised to call back or seek an in-person evaluation if the symptoms worsen or if the condition fails to improve as anticipated.   I provided 45 minutes of non-face-to-face time during this encounter.     Dejay Kronk S, LCAS  715/24

## 2022-12-08 ENCOUNTER — Encounter (HOSPITAL_COMMUNITY): Payer: Self-pay | Admitting: Licensed Clinical Social Worker

## 2022-12-08 ENCOUNTER — Ambulatory Visit (INDEPENDENT_AMBULATORY_CARE_PROVIDER_SITE_OTHER): Payer: Medicare PPO | Admitting: Licensed Clinical Social Worker

## 2022-12-08 DIAGNOSIS — F4310 Post-traumatic stress disorder, unspecified: Secondary | ICD-10-CM | POA: Diagnosis not present

## 2022-12-08 NOTE — Progress Notes (Signed)
Virtual Visit via Video Note   I connected with [name] Fraiser on 12/08/22 at 10-10:45 by a video-enabled telemedicine application and verified that I am speaking with the correct person using two identifiers.   I discussed the limitations of evaluation and management by telemedicine and the availability of in person appointments. The patient expressed understanding and agreed to proceed.   LOCATION: Patient: Home Provider: Home Office   History of Present Illness: Pt was referred to therapy by the [facility] for PTSD.    Participation Level: Active     Type of Therapy: Virtual Individual therapy   Treatment Goal addressed: Meet with clinician in person weekly for therapy to monitor for progress towards goals and address any barriers to success; Reduce depression from average severity level of 6/10 down to a 4/10 in next 90 days by engaging in 1-2 positive coping skills for daily as part of developing self-care routine; Reduce average anxiety level from 7/10 down to 5/10 in next 90 days by utilizing 3-5 relaxation skills/grounding skills per day, such as mindful breathing, progressive muscle relaxation, positive visualizations   Progression towards Goal: Progressing   Interventions: CBT/Supportive/Psychoeducation   Summary: Patient presented for today's session on time and was alert, oriented x5, with no evidence or self-report of SI/HI or A/V H. Patient reported ongoing compliance with medication.  Clinician inquired about patient's current emotional ratings, as well as any significant changes in thoughts, feelings or behavior since previous session. Patient reported his emotional ratings 7/10 for depression, 6/10 for anxiety, 5/10 for anger/irritability. Pt reports on his moods this week and coping skills AEB by self report. Pt provided an update on his health, appts at the [place], upcoming shoulder surgery., daughter's health, disability benefits, death of dog. Pt reports on his  maladaptive behaviors and thinking patterns over the past week, which interferes with resolution of his trauma symptoms 1/3 X within the past week. [name] and pt reviewed his trauma symptoms over the past week: death of his dog 01-25-2023."I love my dogs more than myself." [name] provided education on grief. Pt had multiple tests on his uretha, prostrate due to continued UTIs. The drs can't find an origin of his continued UITs, sending out antibiotics. Sees PCP 8/2. Pt continues to have increase in anxiety due to upcoming shoulder surgery. [name] asked open ended questions and coping    Collaboration of Care: Other: Continue to see providers at [facility]   Patient/Guardian was advised Release of Information must be obtained prior to any record release in order to collaborate their care with an outside provider. Patient/Guardian was advised if they have not alreay done so to contact the registration department to sign all necessary forms in order for [place] to release information regarding their care.    Consent: Patient/Guardian gives verbal consent for treatment and assignment of benefits for services provided during this visit. Patient/Guardian expressed understanding and agreed to proceed.          Assessment and plan: Counselor will continue to meet with patient to address treatment plan goals. Patient will continue to follow recommendations of providers and implement skills learned in session.   Suicidal/Homicidal: Nowithout intent/plan   Therapist Response: Assessed pt's current functioning and reviewed progress.. Assisted pt processing stressors, negative thoughts.  Assisted pt processing for the management of his stressors.   Participation Level: Active   Diagnosis:  PTSD     Follow Up Instructions: I discussed the assessment and treatment plan with the patient. The patient was  provided an opportunity to ask questions and all were answered. The patient agreed with the plan and demonstrated an understanding  of the instructions.   The patient was advised to call back or seek an in-person evaluation if the symptoms worsen or if the condition fails to improve as anticipated.   I provided 45 minutes of non-face-to-face time during this encounter.     Natacia Chaisson S, LCAS  722/24

## 2022-12-22 ENCOUNTER — Ambulatory Visit (INDEPENDENT_AMBULATORY_CARE_PROVIDER_SITE_OTHER): Payer: No Typology Code available for payment source | Admitting: Licensed Clinical Social Worker

## 2022-12-22 DIAGNOSIS — F4310 Post-traumatic stress disorder, unspecified: Secondary | ICD-10-CM

## 2022-12-22 NOTE — Progress Notes (Addendum)
Virtual Visit via Video Note   I connected with Jackson [name]. Lazare on 12/22/22 at 10:00-10:45pm by a video-enabled telemedicine application and verified that I am speaking with the correct person using two identifiers.   I discussed the limitations of evaluation and management by telemedicine and the availability of in person appointments. The patient expressed understanding and agreed to proceed.   LOCATION: Patient: Home Provider: Home Office   History of Present Illness: Pt was referred to therapy by the [facility] for PTSD.    Participation Level: Active     Type of Therapy: Virtual Individual therapy   Treatment Goal addressed: Meet with clinician in person weekly for therapy to monitor for progress towards goals and address any barriers to success; Reduce depression from average severity level of 6/10 down to a 4/10 in next 90 days by engaging in 1-2 positive coping skills for daily as part of developing self-care routine; Reduce average anxiety level from 7/10 down to 5/10 in next 90 days by utilizing 3-5 relaxation skills/grounding skills per day, such as mindful breathing, progressive muscle relaxation, positive visualizations   Progression towards Goal: Progressing   Interventions: CBT/Supportive/Psychoeducation   Summary: Patient presented for today's session on time and was alert, oriented x5, with no evidence or self-report of SI/HI or A/V H. Patient reported ongoing compliance with medication.  Clinician inquired about patient's current emotional ratings, as well as any significant changes in thoughts, feelings or behavior since previous session. Patient reported his emotional ratings 7/10 for depression, 6/10 for anxiety, 5/10 for anger/irritability. Pt reports on his moods this week and coping skills AEB by self report. Pt provided an update on his health, appts at the [place], upcoming shoulder surgery (Wednesday)., daughter's health, disability benefits, death of dog. Pt  reports on his maladaptive behaviors and thinking patterns over the past week, which interferes with resolution of his trauma symptoms 1/3 X within the past week. [name] and pt reviewed his trauma symptoms over the past week: still grieving the death of his dog, fear of upcoming surgery. [name] used CBT to assist patient with managing his fear. [name] explored with patient his thoughts, beliefs, feelings, and behaviors, counteracting his thought patterns. Patient was encouraged to counteract thought patterns including fear.       Collaboration of Care: Other: Continue to see providers at [facility]   Patient/Guardian was advised Release of Information must be obtained prior to any record release in order to collaborate their care with an outside provider. Patient/Guardian was advised if they have not alreay done so to contact the registration department to sign all necessary forms in order for [place] to release information regarding their care.    Consent: Patient/Guardian gives verbal consent for treatment and assignment of benefits for services provided during this visit. Patient/Guardian expressed understanding and agreed to proceed.          Assessment and plan: Counselor will continue to meet with patient to address treatment plan goals. Patient will continue to follow recommendations of providers and implement skills learned in session, practicing skills between sessions. Diagnosis:  PTSD    Suicidal/Homicidal: Nowithout intent/plan   Therapist Response: Assessed pt's current functioning and reviewed progress.. Assisted pt processing stressors, negative thoughts.  Assisted pt processing for the management of his stressors.   Participation Level: Active     Follow Up Instructions: I discussed the assessment and treatment plan with the patient. The patient was provided an opportunity to ask questions and all were answered. The patient  agreed with the plan and demonstrated an understanding of the  instructions.   The patient was advised to call back or seek an in-person evaluation if the symptoms worsen or if the condition fails to improve as anticipated.   I provided 45 minutes of non-face-to-face time during this encounter.     Tyquez Hollibaugh S, LCAS  12/22/22

## 2022-12-29 ENCOUNTER — Ambulatory Visit (INDEPENDENT_AMBULATORY_CARE_PROVIDER_SITE_OTHER): Payer: No Typology Code available for payment source | Admitting: Licensed Clinical Social Worker

## 2022-12-29 ENCOUNTER — Encounter (HOSPITAL_COMMUNITY): Payer: Self-pay | Admitting: Licensed Clinical Social Worker

## 2022-12-29 DIAGNOSIS — F4310 Post-traumatic stress disorder, unspecified: Secondary | ICD-10-CM | POA: Diagnosis not present

## 2023-01-05 ENCOUNTER — Encounter (HOSPITAL_COMMUNITY): Payer: Self-pay | Admitting: Licensed Clinical Social Worker

## 2023-01-05 ENCOUNTER — Ambulatory Visit (INDEPENDENT_AMBULATORY_CARE_PROVIDER_SITE_OTHER): Payer: No Typology Code available for payment source | Admitting: Licensed Clinical Social Worker

## 2023-01-05 DIAGNOSIS — F4310 Post-traumatic stress disorder, unspecified: Secondary | ICD-10-CM

## 2023-01-06 NOTE — Progress Notes (Unsigned)
Virtual Visit via Video Note   I connected with Jackson [name]. Threat on 12/29/22 at 10:00-10:45pm by a video-enabled telemedicine application and verified that I am speaking with the correct person using two identifiers.   I discussed the limitations of evaluation and management by telemedicine and the availability of in person appointments. The patient expressed understanding and agreed to proceed.   LOCATION: Patient: Daughter's house Provider: Home Office   History of Present Illness: Pt was referred to therapy by the [facility] for PTSD.    Participation Level: Active     Type of Therapy: Virtual Individual therapy   Treatment Goal addressed: Meet with clinician in person weekly for therapy to monitor for progress towards goals and address any barriers to success; Reduce depression from average severity level of 6/10 down to a 4/10 in next 90 days by engaging in 1-2 positive coping skills for daily as part of developing self-care routine; Reduce average anxiety level from 7/10 down to 5/10 in next 90 days by utilizing 3-5 relaxation skills/grounding skills per day, such as mindful breathing, progressive muscle relaxation, positive visualizations   Progression towards Goal: Progressing   Interventions: CBT/Supportive/Psychoeducation   Summary: Patient presented for today's session on time and was alert, oriented x5, with no evidence or self-report of SI/HI or A/V H. Patient reported ongoing compliance with medication.  Clinician inquired about patient's current emotional ratings, as well as any significant changes in thoughts, feelings or behavior since previous session. Patient reported his emotional ratings 7/10 for depression, 6/10 for anxiety, 6/10 for anger/irritability. Pt is staying at his daughter's house since his shoulder surgery last week. "I'm struggling staying at my daughter's house because I'm usually so independent.and I miss my dogs. I'm more depressed This is my biggest  stressor now" [name] and pt discussed "sometimes you have to ask for help." Pt reports on his maladaptive behaviors and thinking patterns over the past week, which interferes with resolution of his trauma symptoms 1/3 X within the past week. [name] and pt reviewed his trauma symptoms over the past week: still grieving the death of his dog, recovering from surgery and having to ask for help. [name] used CBT to assist patient with managing his fear of feeling helpless. [name] explored with patient his thoughts, beliefs, feelings, and behaviors, counteracting his thought patterns. Patient was encouraged to counteract thought patterns.       Collaboration of Care: Other: Continue to see providers at [facility]   Patient/Guardian was advised Release of Information must be obtained prior to any record release in order to collaborate their care with an outside provider. Patient/Guardian was advised if they have not alreay done so to contact the registration department to sign all necessary forms in order for [place] to release information regarding their care.    Consent: Patient/Guardian gives verbal consent for treatment and assignment of benefits for services provided during this visit. Patient/Guardian expressed understanding and agreed to proceed.          Assessment and plan: Counselor will continue to meet with patient to address treatment plan goals. Patient will continue to follow recommendations of providers and implement skills learned in session, practicing skills between sessions. Diagnosis:  PTSD    Suicidal/Homicidal: Nowithout intent/plan   Therapist Response: Assessed pt's current functioning and reviewed progress.. Assisted pt processing stressors, negative thoughts.  Assisted pt processing for the management of his stressors.   Participation Level: Active     Follow Up Instructions: I discussed the assessment and  treatment plan with the patient. The patient was provided an opportunity to ask questions  and all were answered. The patient agreed with the plan and demonstrated an understanding of the instructions.   The patient was advised to call back or seek an in-person evaluation if the symptoms worsen or if the condition fails to improve as anticipated.   I provided 45 minutes of non-face-to-face time during this encounter.     Demontray Franta S, LCAS  12/29/22

## 2023-01-12 ENCOUNTER — Ambulatory Visit (HOSPITAL_COMMUNITY): Payer: Medicare PPO | Admitting: Licensed Clinical Social Worker

## 2023-01-14 ENCOUNTER — Ambulatory Visit (HOSPITAL_COMMUNITY): Payer: Medicare PPO | Admitting: Licensed Clinical Social Worker

## 2023-01-14 ENCOUNTER — Encounter (HOSPITAL_COMMUNITY): Payer: Self-pay | Admitting: Licensed Clinical Social Worker

## 2023-01-15 ENCOUNTER — Ambulatory Visit (INDEPENDENT_AMBULATORY_CARE_PROVIDER_SITE_OTHER): Payer: No Typology Code available for payment source | Admitting: Licensed Clinical Social Worker

## 2023-01-15 DIAGNOSIS — F4310 Post-traumatic stress disorder, unspecified: Secondary | ICD-10-CM

## 2023-01-18 ENCOUNTER — Encounter (HOSPITAL_COMMUNITY): Payer: Self-pay | Admitting: Licensed Clinical Social Worker

## 2023-01-18 NOTE — Progress Notes (Signed)
Virtual Visit via Video Note   I connected with [name] Devereux on 01/05/23 at 10:00-10:45pm by a video-enabled telemedicine application and verified that I am speaking with the correct person using two identifiers.   I discussed the limitations of evaluation and management by telemedicine and the availability of in person appointments. The patient expressed understanding and agreed to proceed.   LOCATION: Patient: Daughter's house Provider: Home Office   History of Present Illness: Pt was referred to therapy by the [facility] for PTSD.    Participation Level: Active     Type of Therapy: Virtual Individual therapy   Treatment Goal addressed: Meet with clinician in person weekly for therapy to monitor for progress towards goals and address any barriers to success; Reduce depression from average severity level of 6/10 down to a 4/10 in next 90 days by engaging in 1-2 positive coping skills for daily as part of developing self-care routine; Reduce average anxiety level from 7/10 down to 5/10 in next 90 days by utilizing 3-5 relaxation skills/grounding skills per day, such as mindful breathing, progressive muscle relaxation, positive visualizations   Progression towards Goal: Progressing   Interventions: CBT/Supportive/Psychoeducation   Summary: Patient presented for today's session on time and was alert, oriented x5, with no evidence or self-report of SI/HI or A/V H. Patient reported ongoing compliance with medication.  Clinician inquired about patient's current emotional ratings, as well as any significant changes in thoughts, feelings or behavior since previous session. Patient reported his emotional ratings 7/10 for depression, 6/10 for anxiety, 6/10 for anger/irritability. Pt is staying at his daughter's house since his shoulder surgery. "I'm scontinuing to truggle staying at my daughter's house because I'm usually so independent.and I miss my dogs. I'm more depressed This is my biggest  stressor now" [name] and pt discussed "sometimes you have to ask for help." Pt reports on his maladaptive behaviors and thinking patterns over the past week, which interferes with resolution of his trauma symptoms 1/3 X within the past week. [name] and pt reviewed his trauma symptoms over the past week: still grieving the death of his dog, recovering from surgery and having to ask for help. Clinician utilized MI OARS to reflect and summarize thoughts and feelings about this new normal life he is experiencing. Clinician discussed the importance of focusing on the here and now, what he can control and identifying it's temporary.         Collaboration of Care: Other: Continue to see providers at [facility]   Patient/Guardian was advised Release of Information must be obtained prior to any record release in order to collaborate their care with an outside provider. Patient/Guardian was advised if they have not alreay done so to contact the registration department to sign all necessary forms in order for [place] to release information regarding their care.    Consent: Patient/Guardian gives verbal consent for treatment and assignment of benefits for services provided during this visit. Patient/Guardian expressed understanding and agreed to proceed.          Assessment and plan: Counselor will continue to meet with patient to address treatment plan goals. Patient will continue to follow recommendations of providers and implement skills learned in session, practicing skills between sessions. Diagnosis:  PTSD    Suicidal/Homicidal: Nowithout intent/plan   Therapist Response: Assessed pt's current functioning and reviewed progress.. Assisted pt processing stressors, negative thoughts.  Assisted pt processing for the management of his stressors.   Participation Level: Active     Follow Up  Instructions: I discussed the assessment and treatment plan with the patient. The patient was provided an opportunity to ask  questions and all were answered. The patient agreed with the plan and demonstrated an understanding of the instructions.   The patient was advised to call back or seek an in-person evaluation if the symptoms worsen or if the condition fails to improve as anticipated.   I provided 45 minutes of non-face-to-face time during this encounter.     Bexton Haak S, LCAS  01/05/23

## 2023-01-20 ENCOUNTER — Ambulatory Visit (INDEPENDENT_AMBULATORY_CARE_PROVIDER_SITE_OTHER): Payer: No Typology Code available for payment source | Admitting: Licensed Clinical Social Worker

## 2023-01-20 ENCOUNTER — Encounter (HOSPITAL_COMMUNITY): Payer: Self-pay

## 2023-01-20 ENCOUNTER — Ambulatory Visit (HOSPITAL_COMMUNITY): Payer: No Typology Code available for payment source | Admitting: Licensed Clinical Social Worker

## 2023-01-20 DIAGNOSIS — F4310 Post-traumatic stress disorder, unspecified: Secondary | ICD-10-CM

## 2023-01-25 ENCOUNTER — Encounter (HOSPITAL_COMMUNITY): Payer: Self-pay | Admitting: Licensed Clinical Social Worker

## 2023-01-25 NOTE — Progress Notes (Signed)
Virtual Visit via Video Note   I connected with Jackson [name]. Rubano on 01/15/23 at 10:00-11:00am by a video-enabled telemedicine application and verified that I am speaking with the correct person using two identifiers.   I discussed the limitations of evaluation and management by telemedicine and the availability of in person appointments. The patient expressed understanding and agreed to proceed.   LOCATION: Patient: Home Provider: Home Office   History of Present Illness: Pt was referred to therapy by the [facility] for PTSD.    Participation Level: Active     Type of Therapy: Virtual Individual therapy   Treatment Goal addressed: Meet with clinician in person weekly for therapy to monitor for progress towards goals and address any barriers to success; Reduce depression from average severity level of 6/10 down to a 4/10 in next 90 days by engaging in 1-2 positive coping skills for daily as part of developing self-care routine; Reduce average anxiety level from 7/10 down to 5/10 in next 90 days by utilizing 3-5 relaxation skills/grounding skills per day, such as mindful breathing, progressive muscle relaxation, positive visualizations   Progression towards Goal: Progressing   Interventions: CBT/Supportive/Psychoeducation   Summary: Patient presented for today's session on time and was alert, oriented x5, with no evidence or self-report of SI/HI or A/V H. Patient reported ongoing compliance with medication.  Clinician inquired about patient's current emotional ratings, as well as any significant changes in thoughts, feelings or behavior since previous session. Patient reported his emotional ratings 7/10 for depression, 6/10 for anxiety, 6/10 for anger/irritability. Pt is back home after staying at his daughter's house since his shoulder surgery. "I'm glad to be home but I'm still not independent.". I'm more depressed This is my biggest stressor now I'm having to ask for help now" Again, [name]  and pt discussed "sometimes you have to ask for help." Pt reports on his maladaptive behaviors and thinking patterns over the past week, which interferes with resolution of his trauma symptoms 1/3 X within the past week. [name] and pt reviewed his trauma symptoms over the past week: still grieving the death of his dog, not being independent, fiancial worries, recovering from surgery and having to ask for help. Gain, Clinician utilized MI OARS to reflect and summarize thoughts and feelings about this new normal life he is experiencing. Clinician discussed the importance of focusing on the here and now, what he can control and identifying  this is only temporary.         Collaboration of Care: Other: Continue to see providers at [facility]   Patient/Guardian was advised Release of Information must be obtained prior to any record release in order to collaborate their care with an outside provider. Patient/Guardian was advised if they have not alreay done so to contact the registration department to sign all necessary forms in order for [place] to release information regarding their care.    Consent: Patient/Guardian gives verbal consent for treatment and assignment of benefits for services provided during this visit. Patient/Guardian expressed understanding and agreed to proceed.          Assessment and plan: Counselor will continue to meet with patient to address treatment plan goals. Patient will continue to follow recommendations of providers and implement skills learned in session, practicing skills between sessions. Diagnosis:  PTSD    Suicidal/Homicidal: Nowithout intent/plan   Therapist Response: Assessed pt's current functioning and reviewed progress.. Assisted pt processing stressors, negative thoughts.  Assisted pt processing for the management of his stressors.  Participation Level: Active     Follow Up Instructions: I discussed the assessment and treatment plan with the patient. The patient was  provided an opportunity to ask questions and all were answered. The patient agreed with the plan and demonstrated an understanding of the instructions.   The patient was advised to call back or seek an in-person evaluation if the symptoms worsen or if the condition fails to improve as anticipated.   I provided 60 minutes of non-face-to-face time during this encounter.     Xee Hollman S, LCAS  01/15/23

## 2023-01-26 ENCOUNTER — Ambulatory Visit (INDEPENDENT_AMBULATORY_CARE_PROVIDER_SITE_OTHER): Payer: No Typology Code available for payment source | Admitting: Licensed Clinical Social Worker

## 2023-01-26 ENCOUNTER — Encounter (HOSPITAL_COMMUNITY): Payer: Self-pay | Admitting: Licensed Clinical Social Worker

## 2023-01-26 DIAGNOSIS — F4310 Post-traumatic stress disorder, unspecified: Secondary | ICD-10-CM

## 2023-01-26 NOTE — Progress Notes (Unsigned)
d

## 2023-01-26 NOTE — Progress Notes (Signed)
Virtual Visit via Video Note   I connected with [name] [place] on 01/26/23 at 10:00-10:45am by a video-enabled telemedicine application and verified that I am speaking with the correct person using two identifiers.   I discussed the limitations of evaluation and management by telemedicine and the availability of in person appointments. The patient expressed understanding and agreed to proceed.   LOCATION: Patient: Home Provider: Home Office   History of Present Illness: Pt was referred to therapy by the [facility] for PTSD.    Participation Level: Active     Type of Therapy: Virtual Individual therapy   Treatment Goal addressed: Meet with clinician in person weekly for therapy to monitor for progress towards goals and address any barriers to success; Reduce depression from average severity level of 6/10 down to a 4/10 in next 90 days by engaging in 1-2 positive coping skills for daily as part of developing self-care routine; Reduce average anxiety level from 7/10 down to 5/10 in next 90 days by utilizing 3-5 relaxation skills/grounding skills per day, such as mindful breathing, progressive muscle relaxation, positive visualizations   Progression towards Goal: Progressing   Interventions: CBT/Supportive/Psychoeducation   Summary: Patient presented for today's session on time and was alert, oriented x5, with no evidence or self-report of SI/HI or A/V H. Patient reported ongoing compliance with medication.  Clinician inquired about patient's current emotional ratings, as well as any significant changes in thoughts, feelings or behavior since previous session. Patient reported his emotional ratings 7/10 for depression, 6/10 for anxiety, 6/10 for anger/irritability. Pt is back home after staying at his daughter's house since his shoulder surgery. "I'm becoming more independent and the surgical pain is more manageable." [name] asked open ended questions. "Yesterday was my birthday and I had a lot  of people contact me. My friend took me out for dinner and bought me some gifts. I got my truck fixed so I'm becoming more independent on asking for rides." reports on his maladaptive behaviors and thinking patterns over the past week, which interferes with resolution of his trauma symptoms 1/3 X within the past week. [name] and pt reviewed his trauma symptoms over the past week: being dependent on people to help me,, fiancial worries, still recovering from surgery, surgery pain.  [name] and pt reviewed the benefits of having the surgery.  Session focused on stressors pt is facing as he is also navigating a system with surgical pain. We discussed the impact these stressors have had on pt as well as how he copes with them. [name] validated pt feelings, noting the cumulative effect of multiple concurrent stressors. [name] and pt worked to identify strategies pt might use to reduce stress. [name] also encouraged pt to make sure he is taking time for self-care and seeking support when needed               Collaboration of Care: Other: Continue to see providers at [facility]   Patient/Guardian was advised Release of Information must be obtained prior to any record release in order to collaborate their care with an outside provider. Patient/Guardian was advised if they have not alreay done so to contact the registration department to sign all necessary forms in order for [place] to release information regarding their care.    Consent: Patient/Guardian gives verbal consent for treatment and assignment of benefits for services provided during this visit. Patient/Guardian expressed understanding and agreed to proceed.          Assessment and plan: Counselor will  continue to meet with patient to address treatment plan goals. Patient will continue to follow recommendations of providers and implement skills learned in session, practicing skills between sessions. Diagnosis:  PTSD    Suicidal/Homicidal: Nowithout intent/plan    Therapist Response: Assessed pt's current functioning and reviewed progress.. Assisted pt processing stressors, negative thoughts.  Assisted pt processing for the management of his stressors.   Participation Level: Active     Follow Up Instructions: I discussed the assessment and treatment plan with the patient. The patient was provided an opportunity to ask questions and all were answered. The patient agreed with the plan and demonstrated an understanding of the instructions.   The patient was advised to call back or seek an in-person evaluation if the symptoms worsen or if the condition fails to improve as anticipated.   I provided 45 minutes of non-face-to-face time during this encounter.     Nautika Cressey S, LCAS  01/26/23

## 2023-01-27 ENCOUNTER — Encounter (HOSPITAL_COMMUNITY): Payer: Self-pay | Admitting: Licensed Clinical Social Worker

## 2023-02-02 ENCOUNTER — Ambulatory Visit (INDEPENDENT_AMBULATORY_CARE_PROVIDER_SITE_OTHER): Payer: No Typology Code available for payment source | Admitting: Licensed Clinical Social Worker

## 2023-02-02 DIAGNOSIS — F4310 Post-traumatic stress disorder, unspecified: Secondary | ICD-10-CM | POA: Diagnosis not present

## 2023-02-03 ENCOUNTER — Encounter (HOSPITAL_COMMUNITY): Payer: Self-pay | Admitting: Licensed Clinical Social Worker

## 2023-02-03 NOTE — Progress Notes (Signed)
Virtual Visit via Video Note   I connected with [name] [place] on 02/03/23 at 10:00-10:45am by a video-enabled telemedicine application and verified that I am speaking with the correct person using two identifiers.   I discussed the limitations of evaluation and management by telemedicine and the availability of in person appointments. The patient expressed understanding and agreed to proceed.   LOCATION: Patient: Home Provider: Home Office   History of Present Illness: Pt was referred to therapy by the [facility] for PTSD.    Participation Level: Active     Type of Therapy: Virtual Individual therapy   Treatment Goal addressed: Meet with clinician in person weekly for therapy to monitor for progress towards goals and address any barriers to success; Reduce depression from average severity level of 6/10 down to a 4/10 in next 90 days by engaging in 1-2 positive coping skills for daily as part of developing self-care routine; Reduce average anxiety level from 7/10 down to 5/10 in next 90 days by utilizing 3-5 relaxation skills/grounding skills per day, such as mindful breathing, progressive muscle relaxation, positive visualizations   Progression towards Goal: Progressing   Interventions: CBT/Supportive/Psychoeducation   Summary: Patient presented for today's session on time and was alert, oriented x5, with no evidence or self-report of SI/HI or A/V H. Patient reported ongoing compliance with medication.  Clinician inquired about patient's current emotional ratings, as well as any significant changes in thoughts, feelings or behavior since previous session. Patient reported his emotional ratings 7/10 for depression, 6/10 for anxiety, 6/10 for anger/irritability. Pt is back home after staying at his daughter's house since his shoulder surgery. "I'm becoming more and more independent and the surgical pain is more manageable." [name] asked open ended questions. " Pt reports on his maladaptive  behaviors and thinking patterns over the past week, which interferes with resolution of his trauma symptoms 1/3 X within the past week. [name] and pt reviewed his trauma symptoms over the past week:  The anniversary of 9/11. [name] and pt discussed his thoughts and feelings surrounding 9/11, how it affected him on that day and how it affects him every year.   Collaboration of Care: Other: Continue to see providers at [facility]   Patient/Guardian was advised Release of Information must be obtained prior to any record release in order to collaborate their care with an outside provider. Patient/Guardian was advised if they have not alreay done so to contact the registration department to sign all necessary forms in order for [place] to release information regarding their care.    Consent: Patient/Guardian gives verbal consent for treatment and assignment of benefits for services provided during this visit. Patient/Guardian expressed understanding and agreed to proceed.          Assessment and plan: Counselor will continue to meet with patient to address treatment plan goals. Patient will continue to follow recommendations of providers and implement skills learned in session, practicing skills between sessions. Diagnosis:  PTSD    Suicidal/Homicidal: Nowithout intent/plan   Therapist Response: Assessed pt's current functioning and reviewed progress.. Assisted pt processing stressors, negative thoughts.  Assisted pt processing for the management of his stressors.   Participation Level: Active     Follow Up Instructions: I discussed the assessment and treatment plan with the patient. The patient was provided an opportunity to ask questions and all were answered. The patient agreed with the plan and demonstrated an understanding of the instructions.   The patient was advised to call back or seek  an in-person evaluation if the symptoms worsen or if the condition fails to improve as anticipated.   I provided 45  minutes of non-face-to-face time during this encounter.     Aeneas Longsworth S, LCAS  02/03/23

## 2023-02-09 ENCOUNTER — Ambulatory Visit (INDEPENDENT_AMBULATORY_CARE_PROVIDER_SITE_OTHER): Payer: No Typology Code available for payment source | Admitting: Licensed Clinical Social Worker

## 2023-02-09 ENCOUNTER — Encounter (HOSPITAL_COMMUNITY): Payer: Self-pay | Admitting: Licensed Clinical Social Worker

## 2023-02-09 DIAGNOSIS — F4310 Post-traumatic stress disorder, unspecified: Secondary | ICD-10-CM

## 2023-02-09 NOTE — Progress Notes (Signed)
Virtual Visit via Video Note   I connected with [name] [place] on 02/09/23 at 10:00-10:45am by a video-enabled telemedicine application and verified that I am speaking with the correct person using two identifiers.   I discussed the limitations of evaluation and management by telemedicine and the availability of in person appointments. The patient expressed understanding and agreed to proceed.   LOCATION: Patient: Home Provider: Home Office   History of Present Illness: Pt was referred to therapy by the [facility] for PTSD.    Participation Level: Active     Type of Therapy: Virtual Individual therapy   Treatment Goal addressed: Meet with clinician in person weekly for therapy to monitor for progress towards goals and address any barriers to success; Reduce depression from average severity level of 6/10 down to a 4/10 in next 90 days by engaging in 1-2 positive coping skills for daily as part of developing self-care routine; Reduce average anxiety level from 7/10 down to 5/10 in next 90 days by utilizing 3-5 relaxation skills/grounding skills per day, such as mindful breathing, progressive muscle relaxation, positive visualizations   Progression towards Goal: Progressing   Interventions: CBT/Supportive/Psychoeducation   Summary: Patient presented for today's session on time and was alert, oriented x5, with no evidence or self-report of SI/HI or A/V H. Patient reported ongoing compliance with medication.  Clinician inquired about patient's current emotional ratings, as well as any significant changes in thoughts, feelings or behavior since previous session. Patient reported his emotional ratings 4/10 for depression, 6/10 for anxiety, 4/10 for anger/irritability. Pt reports on his surgical pain, which is 8/10, due to beginning PT. [name] asked open ended questions, suggesting pt talk to PT about the pain."I'm continuing to be more independent and the surgical pain is more manageable." [name]  asked open ended questions. " Pt reports on his maladaptive behaviors and thinking patterns over the past week, which interferes with resolution of his trauma symptoms 1/3 X within the past week. [name] and pt reviewed his trauma symptoms over the past week:  Pt reports on his sleep. Once again, I'm not sleeping well, maybe 2-3 hours per night. [name] and pt reviewed his medication list. [name] suggested pt go over his meds with his PCP at the [place]. [name] and pt explored negative cycle of anxiety including ruminative thoughts and avoidant behaviors and discussed negative consequences of avoidance. skill.      Collaboration of Care: Other: Continue to see providers at [facility]   Patient/Guardian was advised Release of Information must be obtained prior to any record release in order to collaborate their care with an outside provider. Patient/Guardian was advised if they have not alreay done so to contact the registration department to sign all necessary forms in order for [place] to release information regarding their care.    Consent: Patient/Guardian gives verbal consent for treatment and assignment of benefits for services provided during this visit. Patient/Guardian expressed understanding and agreed to proceed.          Assessment and plan: Counselor will continue to meet with patient to address treatment plan goals. Patient will continue to follow recommendations of providers and implement skills learned in session, practicing skills between sessions. Diagnosis:  PTSD    Suicidal/Homicidal: Nowithout intent/plan   Therapist Response: Assessed pt's current functioning and reviewed progress.. Assisted pt processing stressors, negative thoughts.  Assisted pt processing for the management of his stressors.   Participation Level: Active     Follow Up Instructions: I discussed the assessment  and treatment plan with the patient. The patient was provided an opportunity to ask questions and all were answered. The  patient agreed with the plan and demonstrated an understanding of the instructions.   The patient was advised to call back or seek an in-person evaluation if the symptoms worsen or if the condition fails to improve as anticipated.   I provided 45 minutes of non-face-to-face time during this encounter.     Calistro Rauf S, LCAS  02/09/23

## 2023-02-16 ENCOUNTER — Encounter (HOSPITAL_COMMUNITY): Payer: Self-pay | Admitting: Licensed Clinical Social Worker

## 2023-02-16 ENCOUNTER — Ambulatory Visit (INDEPENDENT_AMBULATORY_CARE_PROVIDER_SITE_OTHER): Payer: No Typology Code available for payment source | Admitting: Licensed Clinical Social Worker

## 2023-02-16 DIAGNOSIS — F4310 Post-traumatic stress disorder, unspecified: Secondary | ICD-10-CM

## 2023-02-16 NOTE — Progress Notes (Signed)
Virtual Visit via Video Note   I connected with [name] on 02/16/23 at 10:15-11:am by a video-enabled telemedicine application and verified that I am speaking with the correct person using two identifiers.   I discussed the limitations of evaluation and management by telemedicine and the availability of in person appointments. The patient expressed understanding and agreed to proceed.   LOCATION: Patient: Home Provider: Home Office   History of Present Illness: Pt was referred to therapy by the [facility] for PTSD.    Participation Level: Active     Type of Therapy: Virtual Individual therapy   Treatment Goal addressed: Meet with clinician in person weekly for therapy to monitor for progress towards goals and address any barriers to success; Reduce depression from average severity level of 6/10 down to a 4/10 in next 90 days by engaging in 1-2 positive coping skills for daily as part of developing self-care routine; Reduce average anxiety level from 7/10 down to 5/10 in next 90 days by utilizing 3-5 relaxation skills/grounding skills per day, such as mindful breathing, progressive muscle relaxation, positive visualizations   Progression towards Goal: Progressing   Interventions: CBT/Supportive/Psychoeducation   Summary: Patient presented for today's session on time and was alert, oriented x5, with no evidence or self-report of SI/HI or A/V H. Patient reported ongoing compliance with medication.  Clinician inquired about patient's current emotional ratings, as well as any significant changes in thoughts, feelings or behavior since previous session. Patient reported his emotional ratings 4/10 for depression, 6/10 for anxiety, 4/10 for anger/irritability. Pt reports on his surgical pain, which is 4/10, due to beginning PT. [name] asked open ended questions."I'm continuing to be more independent and the surgical pain is more manageable." [name] asked open ended questions. " Pt reports on  his maladaptive behaviors and thinking patterns over the past week, which interferes with resolution of his trauma symptoms 1/3 X within the past week. [name] and pt reviewed his trauma symptoms over the past week: Marland Kitchen We discussed the impact his stressors have had on pt as well as how he copes with them. [name] validated pt feelings, noting the cumulative effect of multiple concurrent stressors. We worked to identify strategies pt might use to reduce stress. [name] encouraged pt to make sure he is taking time for self-care and seeking support when needed.    Collaboration of Care: Other: Continue to see providers at [facility]   Patient/Guardian was advised Release of Information must be obtained prior to any record release in order to collaborate their care with an outside provider. Patient/Guardian was advised if they have not alreay done so to contact the registration department to sign all necessary forms in order for [place] to release information regarding their care.    Consent: Patient/Guardian gives verbal consent for treatment and assignment of benefits for services provided during this visit. Patient/Guardian expressed understanding and agreed to proceed.          Assessment and plan: Counselor will continue to meet with patient to address treatment plan goals. Patient will continue to follow recommendations of providers and implement skills learned in session, practicing skills between sessions. Diagnosis:  PTSD    Suicidal/Homicidal: Nowithout intent/plan   Therapist Response: Assessed pt's current functioning and reviewed progress.. Assisted pt processing stressors, negative thoughts.  Assisted pt processing for the management of his stressors.   Participation Level: Active     Follow Up Instructions: I discussed the assessment and treatment plan with the patient. The patient was  provided an opportunity to ask questions and all were answered. The patient agreed with the plan and demonstrated an  understanding of the instructions.   The patient was advised to call back or seek an in-person evaluation if the symptoms worsen or if the condition fails to improve as anticipated.   I provided 45 minutes of non-face-to-face time during this encounter.     Jackson [name] S, LCAS  02/16/23

## 2023-02-23 ENCOUNTER — Encounter (HOSPITAL_COMMUNITY): Payer: Self-pay | Admitting: Licensed Clinical Social Worker

## 2023-02-23 ENCOUNTER — Ambulatory Visit (INDEPENDENT_AMBULATORY_CARE_PROVIDER_SITE_OTHER): Payer: No Typology Code available for payment source | Admitting: Licensed Clinical Social Worker

## 2023-02-23 DIAGNOSIS — F4310 Post-traumatic stress disorder, unspecified: Secondary | ICD-10-CM

## 2023-02-23 NOTE — Progress Notes (Signed)
Virtual Visit via Video Note   I connected with [name] [place] on 02/23/23 at 10:15-11:am by a video-enabled telemedicine application and verified that I am speaking with the correct person using two identifiers.   I discussed the limitations of evaluation and management by telemedicine and the availability of in person appointments. The patient expressed understanding and agreed to proceed.   LOCATION: Patient: Home Provider: Home Office   History of Present Illness: Pt was referred to therapy by the [facility] for PTSD.    Participation Level: Active     Type of Therapy: Virtual Individual therapy   Treatment Goal addressed: Meet with clinician in person weekly for therapy to monitor for progress towards goals and address any barriers to success; Reduce depression from average severity level of 6/10 down to a 4/10 in next 90 days by engaging in 1-2 positive coping skills for daily as part of developing self-care routine; Reduce average anxiety level from 7/10 down to 5/10 in next 90 days by utilizing 3-5 relaxation skills/grounding skills per day, such as mindful breathing, progressive muscle relaxation, positive visualizations   Progression towards Goal: Progressing   Interventions: CBT/Supportive/Psychoeducation   Summary: Patient presented for today's session on time and was alert, oriented x5, with no evidence or self-report of SI/HI or A/V H. Patient reported ongoing compliance with medication.  Clinician inquired about patient's current emotional ratings, as well as any significant changes in thoughts, feelings or behavior since previous session. Patient reported his emotional ratings 4/10 for depression, 4/10 for anxiety, 4/10 for anger/irritability. Pt reports on his surgical pain, which is 4/10, due to beginning PT. [name] asked open ended questions."I'm continuing to be more independent and the surgical pain is more manageable."  Pt reports on his maladaptive behaviors and  thinking patterns over the past week, which interferes with resolution of his trauma symptoms 1/3 X within the past week. [name] and pt reviewed his trauma symptoms over the past week: Marland Kitchen We discussed the impact his stressors have had on pt as well as how he copes with them. [name] validated pt feelings, noting the cumulative effect of multiple concurrent stressors. We worked to identify strategies pt might use to reduce stress. Patient participated in a discussion on using mindfulness-based stress response to assist patient in lowering the stress response. Patient was encouraged to use mindfulness skills as a coping skill to assist in lowering stress response.       Collaboration of Care: Other: Continue to see providers at [facility]   Patient/Guardian was advised Release of Information must be obtained prior to any record release in order to collaborate their care with an outside provider. Patient/Guardian was advised if they have not alreay done so to contact the registration department to sign all necessary forms in order for [place] to release information regarding their care.    Consent: Patient/Guardian gives verbal consent for treatment and assignment of benefits for services provided during this visit. Patient/Guardian expressed understanding and agreed to proceed.          Assessment and plan: Counselor will continue to meet with patient to address treatment plan goals. Patient will continue to follow recommendations of providers and implement skills learned in session, practicing skills between sessions. Diagnosis:  PTSD    Suicidal/Homicidal: Nowithout intent/plan   Therapist Response: Assessed pt's current functioning and reviewed progress.. Assisted pt processing stressors, negative thoughts.  Assisted pt processing for the management of his stressors.   Participation Level: Active  Follow Up Instructions: I discussed the assessment and treatment plan with the patient. The patient was  provided an opportunity to ask questions and all were answered. The patient agreed with the plan and demonstrated an understanding of the instructions.   The patient was advised to call back or seek an in-person evaluation if the symptoms worsen or if the condition fails to improve as anticipated.   I provided 45 minutes of non-face-to-face time during this encounter.     Jshon Ibe S, LCAS  02/23/23

## 2023-03-03 ENCOUNTER — Ambulatory Visit (HOSPITAL_COMMUNITY): Payer: Medicare PPO | Admitting: Licensed Clinical Social Worker

## 2023-03-04 ENCOUNTER — Ambulatory Visit (INDEPENDENT_AMBULATORY_CARE_PROVIDER_SITE_OTHER): Payer: No Typology Code available for payment source | Admitting: Licensed Clinical Social Worker

## 2023-03-04 ENCOUNTER — Encounter (HOSPITAL_COMMUNITY): Payer: Self-pay | Admitting: Licensed Clinical Social Worker

## 2023-03-04 DIAGNOSIS — F4310 Post-traumatic stress disorder, unspecified: Secondary | ICD-10-CM | POA: Diagnosis not present

## 2023-03-04 NOTE — Progress Notes (Unsigned)
Virtual Visit via Video Note   I connected with Jackson [name]. Welp on 03/04/23 at 12:30-1pm by a video-enabled telemedicine application and verified that I am speaking with the correct person using two identifiers.   I discussed the limitations of evaluation and management by telemedicine and the availability of in person appointments. The patient expressed understanding and agreed to proceed.   LOCATION: Patient: Home Provider: Home Office   History of Present Illness: Pt was referred to therapy by the [facility] for PTSD.    Participation Level: Active     Type of Therapy: Virtual Individual therapy   Treatment Goal addressed: Meet with clinician in person weekly for therapy to monitor for progress towards goals and address any barriers to success; Reduce depression from average severity level of 6/10 down to a 4/10 in next 90 days by engaging in 1-2 positive coping skills for daily as part of developing self-care routine; Reduce average anxiety level from 7/10 down to 5/10 in next 90 days by utilizing 3-5 relaxation skills/grounding skills per day, such as mindful breathing, progressive muscle relaxation, positive visualizations   Progression towards Goal: Progressing   Interventions: CBT/Supportive/Psychoeducation   Summary: Patient presented for today's session on time and was alert, oriented x5, with no evidence or self-report of SI/HI or A/V H. Patient reported ongoing compliance with medication.  Clinician inquired about patient's current emotional ratings, as well as any significant changes in thoughts, feelings or behavior since previous session. Patient reported his emotional ratings 4/10 for depression, 4/10 for anxiety, 4/10 for anger/irritability. Pt reports on his surgical pain, which is 6/10, due to PT. [name] asked open ended questions.  Pt reports on his maladaptive behaviors and thinking patterns over the past week, which interferes with resolution of his trauma symptoms  1/3 X within the past week. [name] and pt reviewed his trauma symptoms over the past week. We discussed the impact his stressors have had on pt as well as how he copes with them.  [name] used CBT to assist in identifying positives to assess how his mood has been impacted. Clinician allowed space for patient to further process emotions and clinician provided supportive statements throughout session. It was suggested to patient to continue to identify positives that  impact mood daily. [name] provided education on how to to recognize the physical, cognitive, emotional, and behavioral responses that influence his mood.   Collaboration of Care: Other: Continue to see providers at [facility]   Patient/Guardian was advised Release of Information must be obtained prior to any record release in order to collaborate their care with an outside provider. Patient/Guardian was advised if they have not alreay done so to contact the registration department to sign all necessary forms in order for [place] to release information regarding their care.    Consent: Patient/Guardian gives verbal consent for treatment and assignment of benefits for services provided during this visit. Patient/Guardian expressed understanding and agreed to proceed.          Assessment and plan: Counselor will continue to meet with patient to address treatment plan goals. Patient will continue to follow recommendations of providers and implement skills learned in session, practicing skills between sessions. Diagnosis:  PTSD    Suicidal/Homicidal: Nowithout intent/plan   Therapist Response: Assessed pt's current functioning and reviewed progress.. Assisted pt processing stressors, negative thoughts.  Assisted pt processing for the management of his stressors.   Participation Level: Active     Follow Up Instructions: I discussed the assessment and treatment  plan with the patient. The patient was provided an opportunity to ask questions and all were  answered. The patient agreed with the plan and demonstrated an understanding of the instructions.   The patient was advised to call back or seek an in-person evaluation if the symptoms worsen or if the condition fails to improve as anticipated.   I provided 45 minutes of non-face-to-face time during this encounter.     Mileidy Atkin S, LCAS  03/04/23

## 2023-03-09 ENCOUNTER — Encounter (HOSPITAL_COMMUNITY): Payer: Self-pay | Admitting: Licensed Clinical Social Worker

## 2023-03-09 ENCOUNTER — Ambulatory Visit (INDEPENDENT_AMBULATORY_CARE_PROVIDER_SITE_OTHER): Payer: No Typology Code available for payment source | Admitting: Licensed Clinical Social Worker

## 2023-03-09 DIAGNOSIS — F4310 Post-traumatic stress disorder, unspecified: Secondary | ICD-10-CM

## 2023-03-09 NOTE — Progress Notes (Signed)
Virtual Visit via Video Note   I connected with [name] Glasner on 03/09/23 at 10:00-11am by a video-enabled telemedicine application and verified that I am speaking with the correct person using two identifiers.   I discussed the limitations of evaluation and management by telemedicine and the availability of in person appointments. The patient expressed understanding and agreed to proceed.   LOCATION: Patient: Home Provider: Home Office   History of Present Illness: Pt was referred to therapy by the [facility] for PTSD.    Participation Level: Active     Type of Therapy: Virtual Individual therapy   Treatment Goal addressed: Meet with clinician in person weekly for therapy to monitor for progress towards goals and address any barriers to success; Reduce depression from average severity level of 6/10 down to a 4/10 in next 90 days by engaging in 1-2 positive coping skills for daily as part of developing self-care routine; Reduce average anxiety level from 7/10 down to 5/10 in next 90 days by utilizing 3-5 relaxation skills/grounding skills per day, such as mindful breathing, progressive muscle relaxation, positive visualizations   Progression towards Goal: Progressing   Interventions: CBT/Supportive/Psychoeducation   Summary: Patient presented for today's session on time and was alert, oriented x5, with no evidence or self-report of SI/HI or A/V H. Patient reported ongoing compliance with medication.  Clinician inquired about patient's current emotional ratings, as well as any significant changes in thoughts, feelings or behavior since previous session. Patient reported his emotional ratings 4/10 for depression, 4/10 for anxiety, 4/10 for anger/irritability. Pt reports on his surgical pain, which is 6/10, due to continued PT. [name] asked open ended questions.  Pt reports on his maladaptive behaviors and thinking patterns over the past week, which interferes with resolution of his trauma  symptoms 1/3 X within the past week. [name] and pt reviewed his trauma symptoms over the past week. We discussed the impact his stressors have had on pt as well as how he copes with them. Pt discussed with cln his traumatic experiences during the [place] war. [name] provided psychoeducation on Self-care which relies on increased self-awareness, which can benefit pt who is living with a mental illness. Practicing self-awareness can help to recognize patterns in emotions, including events or situations that can trigger worsened symptoms   Collaboration of Care: Other: Continue to see providers at [facility]   Patient/Guardian was advised Release of Information must be obtained prior to any record release in order to collaborate their care with an outside provider. Patient/Guardian was advised if they have not alreay done so to contact the registration department to sign all necessary forms in order for [place] to release information regarding their care.    Consent: Patient/Guardian gives verbal consent for treatment and assignment of benefits for services provided during this visit. Patient/Guardian expressed understanding and agreed to proceed.          Assessment and plan: Counselor will continue to meet with patient to address treatment plan goals. Patient will continue to follow recommendations of providers and implement skills learned in session, practicing skills between sessions. Diagnosis:  PTSD    Suicidal/Homicidal: Nowithout intent/plan   Therapist Response: Assessed pt's current functioning and reviewed progress.. Assisted pt processing stressors, negative thoughts.  Assisted pt processing for the management of his stressors.   Participation Level: Active     Follow Up Instructions: I discussed the assessment and treatment plan with the patient. The patient was provided an opportunity to ask questions and all  were answered. The patient agreed with the plan and demonstrated an understanding of the  instructions.   The patient was advised to call back or seek an in-person evaluation if the symptoms worsen or if the condition fails to improve as anticipated.   I provided 60 minutes of non-face-to-face time during this encounter.     Juhi Lagrange S, LCAS  03/09/23

## 2023-03-16 ENCOUNTER — Encounter (HOSPITAL_COMMUNITY): Payer: Self-pay | Admitting: Licensed Clinical Social Worker

## 2023-03-16 ENCOUNTER — Ambulatory Visit (INDEPENDENT_AMBULATORY_CARE_PROVIDER_SITE_OTHER): Payer: No Typology Code available for payment source | Admitting: Licensed Clinical Social Worker

## 2023-03-16 DIAGNOSIS — F4310 Post-traumatic stress disorder, unspecified: Secondary | ICD-10-CM

## 2023-03-16 NOTE — Progress Notes (Signed)
Virtual Visit via Video Note   I connected with [name] Ground on 03/16/23 at 10:00-10:45am by a video-enabled telemedicine application and verified that I am speaking with the correct person using two identifiers.   I discussed the limitations of evaluation and management by telemedicine and the availability of in person appointments. The patient expressed understanding and agreed to proceed.   LOCATION: Patient: Home Provider: Home Office   History of Present Illness: Pt was referred to therapy by the [facility] for PTSD.    Participation Level: Active     Type of Therapy: Virtual Individual therapy   Treatment Goal addressed: Meet with clinician in person weekly for therapy to monitor for progress towards goals and address any barriers to success; Reduce depression from average severity level of 6/10 down to a 4/10 in next 90 days by engaging in 1-2 positive coping skills for daily as part of developing self-care routine; Reduce average anxiety level from 7/10 down to 5/10 in next 90 days by utilizing 3-5 relaxation skills/grounding skills per day, such as mindful breathing, progressive muscle relaxation, positive visualizations   Progression towards Goal: Progressing   Interventions: CBT/Supportive/Psychoeducation   Summary: Patient presented for today's session on time and was alert, oriented x5, with no evidence or self-report of SI/HI or A/V H. Patient reported ongoing compliance with medication.  Clinician inquired about patient's current emotional ratings, as well as any significant changes in thoughts, feelings or behavior since previous session. Patient reported his emotional ratings 4/10 for depression, 4/10 for anxiety, 4/10 for anger/irritability. Pt reports on his surgical pain, which is 6/10, due to continued PT. [name] asked open ended questions.  Pt reports on his maladaptive behaviors and thinking patterns over the past week, which interferes with resolution of his  trauma symptoms 1/3 X within the past week. [name] and pt reviewed his trauma symptoms over the past week. We discussed the impact his stressors have had on pt as well as how he copes with them. Again, Pt discussed with cln his traumatic experiences during the [place] war.   Collaboration of Care: Other: Continue to see providers at [facility]   Patient/Guardian was advised Release of Information must be obtained prior to any record release in order to collaborate their care with an outside provider. Patient/Guardian was advised if they have not alreay done so to contact the registration department to sign all necessary forms in order for [place] to release information regarding their care.    Consent: Patient/Guardian gives verbal consent for treatment and assignment of benefits for services provided during this visit. Patient/Guardian expressed understanding and agreed to proceed.          Assessment and plan: Counselor will continue to meet with patient to address treatment plan goals. Patient will continue to follow recommendations of providers and implement skills learned in session, practicing skills between sessions. Diagnosis:  PTSD    Suicidal/Homicidal: Nowithout intent/plan   Therapist Response: Assessed pt's current functioning and reviewed progress.. Assisted pt processing stressors, negative thoughts.  Assisted pt processing for the management of his stressors.   Participation Level: Active     Follow Up Instructions: I discussed the assessment and treatment plan with the patient. The patient was provided an opportunity to ask questions and all were answered. The patient agreed with the plan and demonstrated an understanding of the instructions.   The patient was advised to call back or seek an in-person evaluation if the symptoms worsen or if the condition fails  to improve as anticipated.   I provided 45 minutes of non-face-to-face time during this encounter.     Lazariah Savard S,  LCAS  03/16/23

## 2023-03-23 ENCOUNTER — Encounter (HOSPITAL_COMMUNITY): Payer: Self-pay | Admitting: Licensed Clinical Social Worker

## 2023-03-23 ENCOUNTER — Ambulatory Visit (INDEPENDENT_AMBULATORY_CARE_PROVIDER_SITE_OTHER): Payer: No Typology Code available for payment source | Admitting: Licensed Clinical Social Worker

## 2023-03-23 DIAGNOSIS — F4310 Post-traumatic stress disorder, unspecified: Secondary | ICD-10-CM

## 2023-03-23 NOTE — Progress Notes (Addendum)
Virtual Visit via Video Note   I connected with [name] Clerk on 03/23/23 at 10:00-10:45am by a video-enabled telemedicine application and verified that I am speaking with the correct person using two identifiers.   I discussed the limitations of evaluation and management by telemedicine and the availability of in person appointments. The patient expressed understanding and agreed to proceed.   LOCATION: Patient: Home Provider: Home Office   History of Present Illness: Pt was referred to therapy by the [facility] for PTSD.    Participation Level: Active     Type of Therapy: Virtual Individual therapy   Treatment Goal addressed: Meet with clinician in person weekly for therapy to monitor for progress towards goals and address any barriers to success; Reduce depression from average severity level of 6/10 down to a 4/10 in next 90 days by engaging in 1-2 positive coping skills for daily as part of developing self-care routine; Reduce average anxiety level from 7/10 down to 5/10 in next 90 days by utilizing 3-5 relaxation skills/grounding skills per day, such as mindful breathing, progressive muscle relaxation, positive visualizations   Progression towards Goal: Progressing   Interventions: CBT/Supportive/Psychoeducation   Summary: Patient presented for today's session on time and was alert, oriented x5, with no evidence or self-report of SI/HI or A/V H. Patient reported ongoing compliance with medication.  Clinician inquired about patient's current emotional ratings, as well as any significant changes in thoughts, feelings or behavior since previous session. Patient reported his emotional ratings 6/10 for depression, 6/10 for anxiety, 4/10 for anger/irritability. Pt reports on his surgical pain, which is 6/10, due to continued PT. [name] asked open ended questions.  Pt reports on his maladaptive behaviors and thinking patterns over the past week, which interferes with resolution of his  trauma symptoms 1/3 X within the past week. [name] and pt reviewed his trauma symptoms over the past week. We discussed the impact his stressors have had on pt as well as how he copes with them. Again, Pt discussed with cln his traumatic experiences during the [place] war and his experiences with drinking upon discharge from the [employer]. Then his mother passed away. [name] provided psychoeducation on trauma, alcoholism and grief.  Collaboration of Care: Other: Continue to see providers at [facility]   Patient/Guardian was advised Release of Information must be obtained prior to any record release in order to collaborate their care with an outside provider. Patient/Guardian was advised if they have not alreay done so to contact the registration department to sign all necessary forms in order for [place] to release information regarding their care.    Consent: Patient/Guardian gives verbal consent for treatment and assignment of benefits for services provided during this visit. Patient/Guardian expressed understanding and agreed to proceed.          Assessment and plan: Counselor will continue to meet with patient to address treatment plan goals. Patient will continue to follow recommendations of providers and implement skills learned in session, practicing skills between sessions. Diagnosis:  PTSD    Suicidal/Homicidal: Nowithout intent/plan   Therapist Response: Assessed pt's current functioning and reviewed progress.. Assisted pt processing stressors, negative thoughts.  Assisted pt processing for the management of his stressors.   Participation Level: Active     Follow Up Instructions: I discussed the assessment and treatment plan with the patient. The patient was provided an opportunity to ask questions and all were answered. The patient agreed with the plan and demonstrated an understanding of the instructions.  The patient was advised to call back or seek an in-person evaluation if the symptoms worsen or  if the condition fails to improve as anticipated.   I provided 45 minutes of non-face-to-face time during this encounter.     Miyako Oelke S, LCAS  03/23/23

## 2023-03-30 ENCOUNTER — Ambulatory Visit (HOSPITAL_COMMUNITY): Payer: No Typology Code available for payment source | Admitting: Licensed Clinical Social Worker

## 2023-03-30 ENCOUNTER — Ambulatory Visit (INDEPENDENT_AMBULATORY_CARE_PROVIDER_SITE_OTHER): Payer: No Typology Code available for payment source | Admitting: Licensed Clinical Social Worker

## 2023-03-30 DIAGNOSIS — F4310 Post-traumatic stress disorder, unspecified: Secondary | ICD-10-CM

## 2023-04-06 ENCOUNTER — Encounter (HOSPITAL_COMMUNITY): Payer: Self-pay | Admitting: Licensed Clinical Social Worker

## 2023-04-06 ENCOUNTER — Ambulatory Visit (INDEPENDENT_AMBULATORY_CARE_PROVIDER_SITE_OTHER): Payer: No Typology Code available for payment source | Admitting: Licensed Clinical Social Worker

## 2023-04-06 DIAGNOSIS — F4310 Post-traumatic stress disorder, unspecified: Secondary | ICD-10-CM | POA: Diagnosis not present

## 2023-04-06 NOTE — Progress Notes (Signed)
Virtual Visit via Video Note   I connected with [facility] [name]. [facility] [name] on 04/06/23 at 10:00-10:45am by a video-enabled telemedicine application and verified that I am speaking with the correct person using two identifiers.   I discussed the limitations of evaluation and management by telemedicine and the availability of in person appointments. The patient expressed understanding and agreed to proceed.   LOCATION: Patient: Home Provider: Home Office   History of Present Illness: Pt was referred to therapy by the [facility] for PTSD.    Participation Level: Active     Type of Therapy: Virtual Individual therapy   Treatment Goal addressed: Meet with clinician in person weekly for therapy to monitor for progress towards goals and address any barriers to success; Reduce depression from average severity level of 6/10 down to a 4/10 in next 90 days by engaging in 1-2 positive coping skills for daily as part of developing self-care routine; Reduce average anxiety level from 7/10 down to 5/10 in next 90 days by utilizing 3-5 relaxation skills/grounding skills per day, such as mindful breathing, progressive muscle relaxation, positive visualizations   Progression towards Goal: Progressing   Interventions: CBT/Supportive/Psychoeducation   Summary: Patient presented for today's session on time and was alert, oriented x5, with no evidence or self-report of SI/HI or A/V H. Patient reported ongoing compliance with medication.  Clinician inquired about patient's current emotional ratings, as well as any significant changes in thoughts, feelings or behavior since previous session. Patient reported his emotional ratings 6/10 for depression, 6/10 for anxiety, 4/10 for anger/irritability. Pt reports on his surgical pain, which is 6/10, due to continued PT. [name] asked open ended questions.  Pt reports on his maladaptive behaviors and thinking patterns over the past week, which interferes with resolution of his  trauma symptoms 1/3 X within the past week. [name] and pt reviewed his trauma symptoms over the past week. We discussed the impact his stressors have had on pt as well as how he copes with them. Monday was veteran's day. 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 [name] allowed pt to discuss his time  in the [name]. [name] thanked him for his service.            Collaboration of Care: Other: Continue to see providers at [facility]   Patient/Guardian was advised Release of Information must be obtained prior to any record release in order to collaborate their care with an outside provider. Patient/Guardian was advised if they have not alreay done so to contact the registration department to sign all necessary forms in order for [facility] [name] to release information regarding their care.    Consent: Patient/Guardian gives verbal consent for treatment and assignment of benefits for services provided during this visit. Patient/Guardian expressed understanding and agreed to proceed.          Assessment and plan: Counselor will continue to meet with patient to address treatment plan goals. Patient will continue to follow recommendations of providers and implement skills learned in session, practicing skills between sessions. Diagnosis:  PTSD    Suicidal/Homicidal: Nowithout intent/plan   Therapist Response: Assessed pt's current functioning and reviewed progress.. Assisted pt processing stressors, negative thoughts.  Assisted pt processing for the management of his stressors.   Participation Level: Active     Follow Up Instructions: I discussed the assessment and treatment plan with the patient. The patient was provided an opportunity to ask questions and all were answered. The patient agreed with the  plan and demonstrated an understanding of the instructions.   The patient was advised to call back or seek an in-person evaluation if the symptoms worsen or if the condition fails to  improve as anticipated.   I provided 45 minutes of non-face-to-face time during this encounter.     Megyn Leng S, LCAS  04/06/23

## 2023-04-12 ENCOUNTER — Encounter (HOSPITAL_COMMUNITY): Payer: Self-pay | Admitting: Licensed Clinical Social Worker

## 2023-04-12 NOTE — Progress Notes (Signed)
Virtual Visit via Video Note   I connected with [name] Serv[employer] on 03/23/23 at 10:00-10:45am by a video-enabled telemedicine application and verified that I am speaking with the correct person using two identifiers.   I discussed the limitations of evaluation and management by telemedicine and the availability of in person appointments. The patient expressed understanding and agreed to proceed.   LOCATION: Patient: Home Provider: Home Office   History of Present Illness: Pt was referred to therapy by the [facility] for PTSD.    Participation Level: Active     Type of Therapy: Virtual Individual therapy   Treatment Goal addressed: Meet with clinician in person weekly for therapy to monitor for progress towards goals and address any barriers to success; Reduce depression from average severity level of 6/10 down to a 4/10 in next 90 days by engaging in 1-2 positive coping skills for daily as part of developing self-care routine; Reduce average anxiety level from 7/10 down to 5/10 in next 90 days by utilizing 3-5 relaxation skills/grounding skills per day, such as mindful breathing, progressive muscle relaxation, positive visualizations   Progression towards Goal: Progressing   Interventions: CBT/Supportive/Psychoeducation   Summary: Patient presented for today's session on time and was alert, oriented x5, with no evidence or self-report of SI/HI or A/V H. Patient reported ongoing compliance with medication.  Clinician inquired about patient's current emotional ratings, as well as any significant changes in thoughts, feelings or behavior since previous session. Patient reported his emotional ratings 6/10 for depression, 6/10 for anxiety, 4/10 for anger/irritability. Pt reports on his surgical pain, which is 6/10, due to continued PT. [name] asked open ended questions.  Pt reports on his maladaptive behaviors and thinking patterns over the past week, which interferes with resolution of his  trauma symptoms 1/3 X within the past week. [name] and pt reviewed his trauma symptoms over the past week. We discussed the impact his stressors have had on pt as well as how he copes with them. Again, Pt discussed with cln his traumatic experiences during the [place] war.Today is veteran's day. 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 [name] allowed pt to discuss his time serving in the [employer]. [name] thanked him for his service.        Collaboration of Care: Other: Continue to see providers at [facility]   Patient/Guardian was advised Release of Information must be obtained prior to any record release in order to collaborate their care with an outside provider. Patient/Guardian was advised if they have not alreay done so to contact the registration department to sign all necessary forms in order for [place] to release information regarding their care.    Consent: Patient/Guardian gives verbal consent for treatment and assignment of benefits for services provided during this visit. Patient/Guardian expressed understanding and agreed to proceed.          Assessment and plan: Counselor will continue to meet with patient to address treatment plan goals. Patient will continue to follow recommendations of providers and implement skills learned in session, practicing skills between sessions. Diagnosis:  PTSD    Suicidal/Homicidal: Nowithout intent/plan   Therapist Response: Assessed pt's current functioning and reviewed progress.. Assisted pt processing stressors, negative thoughts.  Assisted pt processing for the management of his stressors.   Participation Level: Active     Follow Up Instructions: I discussed the assessment and treatment plan with the patient. The patient was provided an opportunity to ask  questions and all were answered. The patient agreed with the plan and demonstrated an understanding of the instructions.   The patient was advised to call back  or seek an in-person evaluation if the symptoms worsen or if the condition fails to improve as anticipated.   I provided 45 minutes of non-face-to-face time during this encounter.     Jonnelle Lawniczak S, LCAS  03/30/23

## 2023-04-13 ENCOUNTER — Ambulatory Visit (INDEPENDENT_AMBULATORY_CARE_PROVIDER_SITE_OTHER): Payer: No Typology Code available for payment source | Admitting: Licensed Clinical Social Worker

## 2023-04-13 ENCOUNTER — Encounter (HOSPITAL_COMMUNITY): Payer: Self-pay | Admitting: Licensed Clinical Social Worker

## 2023-04-13 DIAGNOSIS — F4310 Post-traumatic stress disorder, unspecified: Secondary | ICD-10-CM

## 2023-04-13 NOTE — Progress Notes (Signed)
Virtual Visit via Video Note   I connected with [name] Lantigua on 04/13/23 at 10:00-10:45am by a video-enabled telemedicine application and verified that I am speaking with the correct person using two identifiers.   I discussed the limitations of evaluation and management by telemedicine and the availability of in person appointments. The patient expressed understanding and agreed to proceed.   LOCATION: Patient: Home Provider: Home Office   History of Present Illness: Pt was referred to therapy by the [facility] for PTSD.    Participation Level: Active     Type of Therapy: Virtual Individual therapy   Treatment Goal addressed: Meet with clinician in person weekly for therapy to monitor for progress towards goals and address any barriers to success; Reduce depression from average severity level of 6/10 down to a 4/10 in next 90 days by engaging in 1-2 positive coping skills for daily as part of developing self-care routine; Reduce average anxiety level from 7/10 down to 5/10 in next 90 days by utilizing 3-5 relaxation skills/grounding skills per day, such as mindful breathing, progressive muscle relaxation, positive visualizations   Progression towards Goal: Progressing   Interventions: CBT/Supportive/Psychoeducation   Summary: Patient presented for today's session on time and was alert, oriented x5, with no evidence or self-report of SI/HI or A/V H. Patient reported ongoing compliance with medication.  Clinician inquired about patient's current emotional ratings, as well as any significant changes in thoughts, feelings or behavior since previous session. Patient reported his emotional ratings 6/10 for depression, 6/10 for anxiety, 4/10 for anger/irritability. Pt reports on his surgical pain, which is 6/10, due to continued PT. [name] asked open ended questions.  Pt reports on his maladaptive behaviors and thinking patterns over the past week, which interferes with resolution of his  trauma symptoms 1/3 X within the past week. [name] and pt reviewed his trauma symptoms over the past week. We discussed the impact his stressors have had on pt as well as how he copes with them. Pt reports on his health, va dr visits, and pain level. "I'm still in PT and still in pain. [facility] and [facility] me to continue in PT." [name] asked open ended questions about how the pain affects his mood symptoms. "It makes them worse. I've tried to stay away from most people because I'm irritable." [name] and pt reviewed coping skills for irritability. Pt role played in session.     Collaboration of Care: Other: Continue to see providers at [facility]   Patient/Guardian was advised Release of Information must be obtained prior to any record release in order to collaborate their care with an outside provider. Patient/Guardian was advised if they have not alreay done so to contact the registration department to sign all necessary forms in order for [place] to release information regarding their care.    Consent: Patient/Guardian gives verbal consent for treatment and assignment of benefits for services provided during this visit. Patient/Guardian expressed understanding and agreed to proceed.          Assessment and plan: Counselor will continue to meet with patient to address treatment plan goals. Patient will continue to follow recommendations of providers and implement skills learned in session, practicing skills between sessions. Diagnosis:  PTSD    Suicidal/Homicidal: Nowithout intent/plan   Therapist Response: Assessed pt's current functioning and reviewed progress.. Assisted pt processing stressors, negative thoughts.  Assisted pt processing for the management of his stressors.   Participation Level: Active  Follow Up Instructions: I discussed the assessment and treatment plan with the patient. The patient was provided an opportunity to ask questions and all were answered. The patient agreed with  the plan and demonstrated an understanding of the instructions.   The patient was advised to call back or seek an in-person evaluation if the symptoms worsen or if the condition fails to improve as anticipated.   I provided 45 minutes of non-face-to-face time during this encounter.     Timotheus Salm S, LCAS  11/125/24

## 2023-04-20 ENCOUNTER — Ambulatory Visit (INDEPENDENT_AMBULATORY_CARE_PROVIDER_SITE_OTHER): Payer: No Typology Code available for payment source | Admitting: Licensed Clinical Social Worker

## 2023-04-20 ENCOUNTER — Encounter (HOSPITAL_COMMUNITY): Payer: Self-pay | Admitting: Licensed Clinical Social Worker

## 2023-04-20 DIAGNOSIS — F4310 Post-traumatic stress disorder, unspecified: Secondary | ICD-10-CM

## 2023-04-20 NOTE — Progress Notes (Signed)
Virtual Visit via Video Note   I connected with [name] Balistreri on 04/20/23 at 10:00-10:45am by a video-enabled telemedicine application and verified that I am speaking with the correct person using two identifiers.   I discussed the limitations of evaluation and management by telemedicine and the availability of in person appointments. The patient expressed understanding and agreed to proceed.   LOCATION: Patient: Home Provider: Home Office   History of Present Illness: Pt was referred to therapy by the [facility] for PTSD.    Participation Level: Active     Type of Therapy: Virtual Individual therapy   Treatment Goal addressed: Meet with clinician in person weekly for therapy to monitor for progress towards goals and address any barriers to success; Reduce depression from average severity level of 6/10 down to a 4/10 in next 90 days by engaging in 1-2 positive coping skills for daily as part of developing self-care routine; Reduce average anxiety level from 7/10 down to 5/10 in next 90 days by utilizing 3-5 relaxation skills/grounding skills per day, such as mindful breathing, progressive muscle relaxation, positive visualizations   Progression towards Goal: Progressing   Interventions: CBT/Supportive/Psychoeducation   Summary: Patient presented for today's session on time and was alert, oriented x5, with no evidence or self-report of SI/HI or A/V H. Patient reported ongoing compliance with medication.  Clinician inquired about patient's current emotional ratings, as well as any significant changes in thoughts, feelings or behavior since previous session. Patient reported his emotional ratings 6/10 for depression, 6/10 for anxiety, 4/10 for anger/irritability. Pt reports on his surgical pain, which is 6/10, due to continued  PT. [name] asked open ended questions.  Pt reports on his maladaptive behaviors and thinking patterns over the past week, which interferes with resolution of his  trauma symptoms 1/3 X within the past week. [name] and pt reviewed his trauma symptoms over the past week. We discussed the impact his stressors have had on pt as well as how he copes with them. Pt reports on his health, va dr visits, and pain level. "I'm still in PT and still in pain. 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 [name] asked open-ended questions. [name] and pt discussed repercussions of resigning and role played how to answer questions he will certainly encounter. [name] provided psychoeducation on using mindfulness-based stress response to assist patient in lowering the stress response. Patient was encouraged to use mindfulness skills as a coping skill to assist in lowering stress response.       Collaboration of Care: Other: Continue to see providers at [facility]   Patient/Guardian was advised Release of Information must be obtained prior to any record release in order to collaborate their care with an outside provider. Patient/Guardian was advised if they have not alreay done so to contact the registration department to sign all necessary forms in order for [place] to release information regarding their care.    Consent: Patient/Guardian gives verbal consent for treatment and assignment of benefits for services provided during this visit. Patient/Guardian expressed understanding and agreed to proceed.          Assessment and plan: Counselor will continue to meet with patient to address treatment plan goals. Patient will continue to follow recommendations of providers and implement skills learned in session, practicing skills between sessions. Diagnosis:  PTSD    Suicidal/Homicidal: Nowithout intent/plan   Therapist Response: Assessed pt's current functioning and reviewed progress.. Assisted pt processing stressors, negative thoughts.  Assisted pt processing for the management of his stressors.   Participation Level: Active     Follow Up  Instructions: I discussed the assessment and treatment plan with the patient. The patient was provided an opportunity to ask questions and all were answered. The patient agreed with the plan and demonstrated an understanding of the instructions.   The patient was advised to call back or seek an in-person evaluation if the symptoms worsen or if the condition fails to improve as anticipated.   I provided 45 minutes of non-face-to-face time during this encounter.     Camillo Quadros S, LCAS  04/20/23

## 2023-04-27 ENCOUNTER — Ambulatory Visit (INDEPENDENT_AMBULATORY_CARE_PROVIDER_SITE_OTHER): Payer: No Typology Code available for payment source | Admitting: Licensed Clinical Social Worker

## 2023-04-27 ENCOUNTER — Encounter (HOSPITAL_COMMUNITY): Payer: Self-pay | Admitting: Licensed Clinical Social Worker

## 2023-04-27 DIAGNOSIS — F4310 Post-traumatic stress disorder, unspecified: Secondary | ICD-10-CM

## 2023-04-27 NOTE — Progress Notes (Signed)
Virtual Visit via Video Note   I connected with [name] on 04/27/23 at 10:00-10:45am by a video-enabled telemedicine application and verified that I am speaking with the correct person using two identifiers.   I discussed the limitations of evaluation and management by telemedicine and the availability of in person appointments. The patient expressed understanding and agreed to proceed.   LOCATION: Patient: Home Provider: Home Office   History of Present Illness: Pt was referred to therapy by the [facility] for PTSD.    Participation Level: Active     Type of Therapy: Virtual Individual therapy   Treatment Goal addressed: Meet with clinician in person weekly for therapy to monitor for progress towards goals and address any barriers to success; Reduce depression from average severity level of 6/10 down to a 4/10 in next 90 days by engaging in 1-2 positive coping skills for daily as part of developing self-care routine; Reduce average anxiety level from 7/10 down to 5/10 in next 90 days by utilizing 3-5 relaxation skills/grounding skills per day, such as mindful breathing, progressive muscle relaxation, positive visualizations   Progression towards Goal: Progressing   Interventions: CBT/Supportive/Psychoeducation   Summary: Patient presented for today's session on time and was alert, oriented x5, with no evidence or self-report of SI/HI or A/V H. Patient reported ongoing compliance with medication.  Clinician inquired about patient's current emotional ratings, as well as any significant changes in thoughts, feelings or behavior since previous session. Patient reported his emotional ratings 6/10 for depression, 6/10 for anxiety, 4/10 for anger/irritability. Pt reports on his surgical pain, which is 6/10, due to continued  PT. [name] asked open ended questions.  Pt reports on his maladaptive behaviors and thinking patterns over the past week, which interferes with resolution of his  trauma symptoms 1/3 X within the past week. [name] and pt reviewed his trauma symptoms over the past week. We discussed the impact his stressors have had on pt as well as how he copes with them. Pt reports on his health, va dr visits, and pain level. "I'm still in PT and still in pain." [name] asked open ended questions. "I'm frustrated with the amount of shoulder pain I continue to have." [name] suggested pt continue in PT, provided psychoeducation on acceptance. "I have a dr appt in January to discuss his back pain. [name] and pt discussed risks and benefits of back surgery. Clinician utilized MI OARS to affirm his concerns. Clinician challenged thoughts about this. Clinician processed options for communicating his concerns.          Collaboration of Care: Other: Continue to see providers at [facility]   Patient/Guardian was advised Release of Information must be obtained prior to any record release in order to collaborate their care with an outside provider. Patient/Guardian was advised if they have not alreay done so to contact the registration department to sign all necessary forms in order for [place] to release information regarding their care.    Consent: Patient/Guardian gives verbal consent for treatment and assignment of benefits for services provided during this visit. Patient/Guardian expressed understanding and agreed to proceed.          Assessment and plan: Counselor will continue to meet with patient to address treatment plan goals. Patient will continue to follow recommendations of providers and implement skills learned in session, practicing skills between sessions. Diagnosis:  PTSD    Suicidal/Homicidal: Nowithout intent/plan   Therapist Response: Assessed pt's current functioning and reviewed progress.. Assisted pt processing stressors,  negative thoughts.  Assisted pt processing for the management of his stressors.   Participation Level: Active     Follow Up Instructions: I discussed the  assessment and treatment plan with the patient. The patient was provided an opportunity to ask questions and all were answered. The patient agreed with the plan and demonstrated an understanding of the instructions.   The patient was advised to call back or seek an in-person evaluation if the symptoms worsen or if the condition fails to improve as anticipated.   I provided 45 minutes of non-face-to-face time during this encounter.     Kennette Cuthrell S, LCAS  04/27/23

## 2023-05-04 ENCOUNTER — Ambulatory Visit (INDEPENDENT_AMBULATORY_CARE_PROVIDER_SITE_OTHER): Payer: No Typology Code available for payment source | Admitting: Licensed Clinical Social Worker

## 2023-05-04 DIAGNOSIS — F4310 Post-traumatic stress disorder, unspecified: Secondary | ICD-10-CM | POA: Diagnosis not present

## 2023-05-11 ENCOUNTER — Ambulatory Visit (HOSPITAL_COMMUNITY): Payer: Medicare PPO | Admitting: Licensed Clinical Social Worker

## 2023-05-11 ENCOUNTER — Encounter (HOSPITAL_COMMUNITY): Payer: Self-pay

## 2023-05-18 ENCOUNTER — Ambulatory Visit (INDEPENDENT_AMBULATORY_CARE_PROVIDER_SITE_OTHER): Payer: No Typology Code available for payment source | Admitting: Licensed Clinical Social Worker

## 2023-05-18 DIAGNOSIS — F4310 Post-traumatic stress disorder, unspecified: Secondary | ICD-10-CM | POA: Diagnosis not present

## 2023-05-20 ENCOUNTER — Encounter (HOSPITAL_COMMUNITY): Payer: Self-pay | Admitting: Licensed Clinical Social Worker

## 2023-05-23 ENCOUNTER — Encounter (HOSPITAL_COMMUNITY): Payer: Self-pay | Admitting: Licensed Clinical Social Worker

## 2023-05-25 ENCOUNTER — Ambulatory Visit (INDEPENDENT_AMBULATORY_CARE_PROVIDER_SITE_OTHER): Payer: No Typology Code available for payment source | Admitting: Licensed Clinical Social Worker

## 2023-05-25 DIAGNOSIS — F4310 Post-traumatic stress disorder, unspecified: Secondary | ICD-10-CM

## 2023-06-01 ENCOUNTER — Ambulatory Visit (INDEPENDENT_AMBULATORY_CARE_PROVIDER_SITE_OTHER): Payer: No Typology Code available for payment source | Admitting: Licensed Clinical Social Worker

## 2023-06-01 ENCOUNTER — Encounter (HOSPITAL_COMMUNITY): Payer: Self-pay | Admitting: Licensed Clinical Social Worker

## 2023-06-01 DIAGNOSIS — F4310 Post-traumatic stress disorder, unspecified: Secondary | ICD-10-CM | POA: Diagnosis not present

## 2023-06-01 NOTE — Progress Notes (Signed)
 Virtual Visit via Video Note  I connected with [name] on 05/25/23 at 10:00-10:45am by a video-enabled telemedicine application and verified that I am speaking with the correct person using two identifiers.   I discussed the limitations of evaluation and management by telemedicine and the availability of in person appointments. The patient expressed understanding and agreed to proceed.   LOCATION: Patient: Home Provider: Home Office   History of Present Illness: Pt was referred to therapy by the [facility] for PTSD.    Participation Level: Active     Type of Therapy: Virtual Individual therapy   Treatment Goal addressed: Meet with clinician in person weekly for therapy to monitor for progress towards goals and address any barriers to success; Reduce depression from average severity level of 6/10 down to a 4/10 in next 90 days by engaging in 1-2 positive coping skills for daily as part of developing self-care routine; Reduce average anxiety level from 7/10 down to 5/10 in next 90 days by utilizing 3-5 relaxation skills/grounding skills per day, such as mindful breathing, progressive muscle relaxation, positive visualizations   Progression towards Goal: Progressing   Interventions: CBT/Supportive/Psychoeducation   Summary: Patient presented for today's session on time and was alert, oriented x5, with no evidence or self-report of SI/HI or A/V H. Patient reported ongoing compliance with medication.  Clinician inquired about patient's current emotional ratings, as well as any significant changes in thoughts, feelings or behavior since previous session. Patient reported his emotional ratings 6/10 for depression, 6/10 for anxiety, 4/10 for anger/irritability. Pt reports on his surgical pain, which is 5/10, due to continued  PT. [name] asked open ended questions.  Pt reports on his maladaptive behaviors and thinking patterns over the past week, which interferes with resolution of his  trauma symptoms 1/3 X within the past week. Pt reported on 1 nightmare he experienced over the past week.[name] and pt reviewed his trauma symptoms over the past week. [name] asked open ended questions. We discussed the impact his stressors have had on pt as well as how he copes with them. Pt reports on his health, va dr visits, and pain level. I'm still in PT and still in pain, but somewhat less pain. [name] asked open ended questions. I'm frustrated with the amount of back pain I have. The dr will see me this month to determine next steps for my back. Again, cln and pt discussed risks and benefits of back surgery. Again, clinician and pt role played questions and concerns to address about back surgery, risks and benefits.        Collaboration of Care: Other: Continue to see providers at [facility]   Patient/Guardian was advised Release of Information must be obtained prior to any record release in order to collaborate their care with an outside provider. Patient/Guardian was advised if they have not alreay done so to contact the registration department to sign all necessary forms in order for us  to release information regarding their care.    Consent: Patient/Guardian gives verbal consent for treatment and assignment of benefits for services provided during this visit. Patient/Guardian expressed understanding and agreed to proceed.          Assessment and plan: Counselor will continue to meet with patient to address treatment plan goals. Patient will continue to follow recommendations of providers and implement skills learned in session, practicing skills between sessions. Diagnosis:  PTSD    Suicidal/Homicidal: Nowithout intent/plan   Therapist Response: Assessed pt's current functioning and reviewed progress.[name]  Assisted pt processing stressors, negative thoughts.  Assisted pt processing for the management of his stressors.   Participation Level: Active     Follow Up Instructions: I discussed the  assessment and treatment plan with the patient. The patient was provided an opportunity to ask questions and all were answered. The patient agreed with the plan and demonstrated an understanding of the instructions.   The patient was advised to call back or seek an in-person evaluation if the symptoms worsen or if the condition fails to improve as anticipated.   I provided 45 minutes of non-face-to-face time during this encounter.     Lyndon Chenoweth S, LCAS  05/25/23

## 2023-06-08 ENCOUNTER — Encounter (HOSPITAL_COMMUNITY): Payer: Self-pay | Admitting: Licensed Clinical Social Worker

## 2023-06-08 ENCOUNTER — Ambulatory Visit (INDEPENDENT_AMBULATORY_CARE_PROVIDER_SITE_OTHER): Payer: No Typology Code available for payment source | Admitting: Licensed Clinical Social Worker

## 2023-06-08 DIAGNOSIS — F4310 Post-traumatic stress disorder, unspecified: Secondary | ICD-10-CM

## 2023-06-09 NOTE — Progress Notes (Signed)
Virtual Visit via Video Note  I connected with [name] Schubert on 06/01/23 at 10:00-10:45am by a video-enabled telemedicine application and verified that I am speaking with the correct person using two identifiers.   I discussed the limitations of evaluation and management by telemedicine and the availability of in person appointments. The patient expressed understanding and agreed to proceed.   LOCATION: Patient: Home Provider: Home Office   History of Present Illness: Pt was referred to therapy by the [facility] for PTSD.    Participation Level: Active     Type of Therapy: Virtual Individual therapy   Treatment Goal addressed: Meet with clinician in person weekly for therapy to monitor for progress towards goals and address any barriers to success; Reduce depression from average severity level of 6/10 down to a 4/10 in next 90 days by engaging in 1-2 positive coping skills for daily as part of developing self-care routine; Reduce average anxiety level from 7/10 down to 5/10 in next 90 days by utilizing 3-5 relaxation skills/grounding skills per day, such as mindful breathing, progressive muscle relaxation, positive visualizations   Progression towards Goal: Progressing   Interventions: CBT/Supportive/Psychoeducation   Summary: Patient presented for today's session on time and was alert, oriented x5, with no evidence or self-report of SI/HI or A/V H. Patient reported ongoing compliance with medication.  Clinician inquired about patient's current emotional ratings, as well as any significant changes in thoughts, feelings or behavior since previous session. Patient reported his emotional ratings 6/10 for depression, 6/10 for anxiety, 4/10 for anger/irritability. . [name] asked open ended questions.  Pt reports on his maladaptive behaviors and thinking patterns over the past week, which interferes with resolution of his trauma symptoms 1/3 X within the past week. Pt reported on 1 nightmare  he experienced over the past week.[name] and pt reviewed his trauma symptoms over the past week. [name] asked open ended questions. We discussed the impact his stressors have had on pt as well as how he copes with them. Pt reports on his health, va dr visits, and pain level. "I dropped out of PT and still have some pain, but I dropped out because of the cold weather, but I'll go back in 2 months." [name] asked open ended questions. "I'm still frustrated with the amount of back pain I have. The dr will see me this month to determine next steps for my back."   [name] used CBT to assist in identifying positives to assess how his mood has been impacted. Clinician allowed space for patient to further process emotions and clinician provided supportive statements throughout session.     Collaboration of Care: Other: Continue to see providers at [facility]   Patient/Guardian was advised Release of Information must be obtained prior to any record release in order to collaborate their care with an outside provider. Patient/Guardian was advised if they have not alreay done so to contact the registration department to sign all necessary forms in order for [place] to release information regarding their care.    Consent: Patient/Guardian gives verbal consent for treatment and assignment of benefits for services provided during this visit. Patient/Guardian expressed understanding and agreed to proceed.          Assessment and plan: Counselor will continue to meet with patient to address treatment plan goals. Patient will continue to follow recommendations of providers and implement skills learned in session, practicing skills between sessions. Diagnosis:  PTSD    Suicidal/Homicidal: Nowithout intent/plan   Therapist Response: Assessed pt's  current functioning and reviewed progress.. Assisted pt processing stressors, negative thoughts.  Assisted pt processing for the management of his stressors.   Participation Level: Active      Follow Up Instructions: I discussed the assessment and treatment plan with the patient. The patient was provided an opportunity to ask questions and all were answered. The patient agreed with the plan and demonstrated an understanding of the instructions.   The patient was advised to call back or seek an in-person evaluation if the symptoms worsen or if the condition fails to improve as anticipated.   I provided 45 minutes of non-face-to-face time during this encounter.     Ganesh Deeg S, LCAS  06/01/23

## 2023-06-10 ENCOUNTER — Encounter (HOSPITAL_COMMUNITY): Payer: Self-pay | Admitting: Licensed Clinical Social Worker

## 2023-06-10 NOTE — Progress Notes (Unsigned)
Virtual Visit via Video Note  I connected with [name] Collard on 06/08/23 at 10:00-10:45am by a video-enabled telemedicine application and verified that I am speaking with the correct person using two identifiers.   I discussed the limitations of evaluation and management by telemedicine and the availability of in person appointments. The patient expressed understanding and agreed to proceed.   LOCATION: Patient: Home Provider: Home Office   History of Present Illness: Pt was referred to therapy by the [facility] for PTSD.    Participation Level: Active     Type of Therapy: Virtual Individual therapy   Treatment Goal addressed: Meet with clinician in person weekly for therapy to monitor for progress towards goals and address any barriers to success; Reduce depression from average severity level of 6/10 down to a 4/10 in next 90 days by engaging in 1-2 positive coping skills for daily as part of developing self-care routine; Reduce average anxiety level from 7/10 down to 5/10 in next 90 days by utilizing 3-5 relaxation skills/grounding skills per day, such as mindful breathing, progressive muscle relaxation, positive visualizations   Progression towards Goal: Progressing   Interventions: CBT/Supportive/Psychoeducation   Summary: Patient presented for today's session on time and was alert, oriented x5, with no evidence or self-report of SI/HI or A/V H. Patient reported ongoing compliance with medication.  Clinician inquired about patient's current emotional ratings, as well as any significant changes in thoughts, feelings or behavior since previous session. Patient reported his emotional ratings 6/10 for depression, 6/10 for anxiety, 4/10 for anger/irritability. . [name] asked open ended questions.  Pt reports on his maladaptive behaviors and thinking patterns over the past week, which interferes with resolution of his trauma symptoms 1/3 X within the past week. Pt reported on 1 nightmare  he experienced over the past week.[name] and pt reviewed his trauma symptoms over the past week. [name] asked open ended questions. We discussed the impact his stressors have had on pt as well as how he copes with them. Pt reports on his health, va dr visits, and pain level. [name] asked open ended questions. "I'm still frustrated with the amount of back pain I have. The dr will see me this month to determine next steps for my back."   [name] asked open ended questions about his readiness for surgery or alternatives. [name] and pt  focused on stressors pt is facing as he is also navigating all his health concerns. We discussed the impact these stressors have had on pt as well as how he copes with them. [name] validated pt feelings, noting the cumulative effect of multiple concurrent stressors. We worked to identify strategies pt might use to reduce stress.     Collaboration of Care: Other: Continue to see providers at [facility]   Patient/Guardian was advised Release of Information must be obtained prior to any record release in order to collaborate their care with an outside provider. Patient/Guardian was advised if they have not alreay done so to contact the registration department to sign all necessary forms in order for [place] to release information regarding their care.    Consent: Patient/Guardian gives verbal consent for treatment and assignment of benefits for services provided during this visit. Patient/Guardian expressed understanding and agreed to proceed.          Assessment and plan: Counselor will continue to meet with patient to address treatment plan goals. Patient will continue to follow recommendations of providers and implement skills learned in session, practicing skills between sessions. Diagnosis:  PTSD    Suicidal/Homicidal: Nowithout intent/plan   Therapist Response: Assessed pt's current functioning and reviewed progress.. Assisted pt processing stressors, negative thoughts.  Assisted pt processing for  the management of his stressors.   Participation Level: Active     Follow Up Instructions: I discussed the assessment and treatment plan with the patient. The patient was provided an opportunity to ask questions and all were answered. The patient agreed with the plan and demonstrated an understanding of the instructions.   The patient was advised to call back or seek an in-person evaluation if the symptoms worsen or if the condition fails to improve as anticipated.   I provided 45 minutes of non-face-to-face time during this encounter.     Helga Asbury S, LCAS  06/08/23

## 2023-06-15 ENCOUNTER — Ambulatory Visit (INDEPENDENT_AMBULATORY_CARE_PROVIDER_SITE_OTHER): Payer: Medicare PPO | Admitting: Licensed Clinical Social Worker

## 2023-06-15 DIAGNOSIS — F4310 Post-traumatic stress disorder, unspecified: Secondary | ICD-10-CM

## 2023-06-19 ENCOUNTER — Encounter (HOSPITAL_COMMUNITY): Payer: Self-pay | Admitting: Licensed Clinical Social Worker

## 2023-06-19 NOTE — Progress Notes (Signed)
Virtual Visit via Video Note  I connected with [name]. Hermans on 06/15/23 at 3:00-3:45pm by a video-enabled telemedicine application and verified that I am speaking with the correct person using two identifiers.   I discussed the limitations of evaluation and management by telemedicine and the availability of in person appointments. The patient expressed understanding and agreed to proceed.   LOCATION: Patient: Home Provider: Home Office   History of Present Illness: Pt was referred to therapy by the [facility] for PTSD.    Participation Level: Active     Type of Therapy: Virtual Individual therapy   Treatment Goal addressed: Meet with clinician in person weekly for therapy to monitor for progress towards goals and address any barriers to success; Reduce depression from average severity level of 6/10 down to a 4/10 in next 90 days by engaging in 1-2 positive coping skills for daily as part of developing self-care routine; Reduce average anxiety level from 7/10 down to 5/10 in next 90 days by utilizing 3-5 relaxation skills/grounding skills per day, such as mindful breathing, progressive muscle relaxation, positive visualizations   Progression towards Goal: Progressing   Interventions: CBT/Supportive/Psychoeducation   Summary: Patient presented for today's session on time and was alert, oriented x5, with no evidence or self-report of SI/HI or A/V H. Patient reported ongoing compliance with medication.  Clinician inquired about patient's current emotional ratings, as well as any significant changes in thoughts, feelings or behavior since previous session. Patient reported his emotional ratings 6/10 for depression, 6/10 for anxiety, 2/10 for anger/irritability. . [name] asked open ended questions.  Pt reports on his maladaptive behaviors and thinking patterns over the past week, which interferes with resolution of his trauma symptoms 1/3 X within the past week. Pt reported on 1 nightmare he  experienced over the past week.[name] and pt reviewed his trauma symptoms over the past week. [name] asked open ended questions. We discussed the impact his stressors have had on pt as well as how he copes with them. Pt reports on his health, va dr visits, and pain level. [name] asked open ended questions. "I'm still frustrated with the amount of back pain I have. I saw the dr and he wants me to continue PT and will see me again in a month."   [name] asked open ended questions about his readiness for surgery or alternatives. [name] and pt  focused on stressors pt is facing as he is also navigating all his health concerns. We discussed the impact these stressors have had on pt as well as how he copes with them. Towards the end of the session pt reports, "I got my 100% disability with back pay. I will start getting an extra $2,000 per month. " [name] was supportive with this announcement and cln and pt discussed the back pay and future life with enough $ monthly to live.    Collaboration of Care: Other: Continue to see providers at [facility]   Patient/Guardian was advised Release of Information must be obtained prior to any record release in order to collaborate their care with an outside provider. Patient/Guardian was advised if they have not alreay done so to contact the registration department to sign all necessary forms in order for [place] to release information regarding their care.    Consent: Patient/Guardian gives verbal consent for treatment and assignment of benefits for services provided during this visit. Patient/Guardian expressed understanding and agreed to proceed.          Assessment and plan: Counselor  will continue to meet with patient to address treatment plan goals. Patient will continue to follow recommendations of providers and implement skills learned in session, practicing skills between sessions. Diagnosis:  PTSD    Suicidal/Homicidal: Nowithout intent/plan   Therapist Response: Assessed pt's current  functioning and reviewed progress.. Assisted pt processing stressors, negative thoughts.  Assisted pt processing for the management of his stressors.   Participation Level: Active     Follow Up Instructions: I discussed the assessment and treatment plan with the patient. The patient was provided an opportunity to ask questions and all were answered. The patient agreed with the plan and demonstrated an understanding of the instructions.   The patient was advised to call back or seek an in-person evaluation if the symptoms worsen or if the condition fails to improve as anticipated.   I provided 45 minutes of non-face-to-face time during this encounter.     Keval Nam S, LCAS  06/15/23

## 2023-06-22 ENCOUNTER — Encounter (HOSPITAL_COMMUNITY): Payer: Self-pay | Admitting: Licensed Clinical Social Worker

## 2023-06-22 ENCOUNTER — Ambulatory Visit (INDEPENDENT_AMBULATORY_CARE_PROVIDER_SITE_OTHER): Payer: Medicare PPO | Admitting: Licensed Clinical Social Worker

## 2023-06-22 DIAGNOSIS — F4310 Post-traumatic stress disorder, unspecified: Secondary | ICD-10-CM | POA: Diagnosis not present

## 2023-06-22 NOTE — Progress Notes (Signed)
Virtual Visit via Video Note  I connected with [name] Mutch on 06/22/23 at 10:00-10:45pm by a video-enabled telemedicine application and verified that I am speaking with the correct person using two identifiers.   I discussed the limitations of evaluation and management by telemedicine and the availability of in person appointments. The patient expressed understanding and agreed to proceed.   LOCATION: Patient: Home Provider: Home Office   History of Present Illness: Pt was referred to therapy by the [facility] for PTSD.    Participation Level: Active     Type of Therapy: Virtual Individual therapy   Treatment Goal addressed: Meet with clinician in person weekly for therapy to monitor for progress towards goals and address any barriers to success; Reduce depression from average severity level of 6/10 down to a 4/10 in next 90 days by engaging in 1-2 positive coping skills for daily as part of developing self-care routine; Reduce average anxiety level from 7/10 down to 5/10 in next 90 days by utilizing 3-5 relaxation skills/grounding skills per day, such as mindful breathing, progressive muscle relaxation, positive visualizations   Progression towards Goal: Progressing   Interventions: CBT/Supportive/Psychoeducation   Summary: Patient presented for today's session on time and was alert, oriented x5, with no evidence or self-report of SI/HI or A/V H. Patient reported ongoing compliance with medication.  Clinician inquired about patient's current emotional ratings, as well as any significant changes in thoughts, feelings or behavior since previous session. Patient reported his emotional ratings 6/10 for depression, 6/10 for anxiety, 2/10 for anger/irritability. . [name] asked open ended questions.  Pt reports on his maladaptive behaviors and thinking patterns over the past week, which interferes with resolution of his trauma symptoms 1/3 X within the past week. Pt reported no nightmares  over the past week.[name] and pt reviewed his trauma symptoms over the past week. [name] asked open ended questions. We discussed the impact his stressors have had on pt as well as how he copes with them. Pt reports on his health, va dr visits, and pain level. [name] asked open ended questions. [name] and pt  focused on stressors pt is facing as he is also navigating all his health concerns. We discussed the impact these stressors have had on pt as well as how he copes with them. [name] and pt discussed plans for back pay $.  "I paid all the bills and I had $ leftover." [name] was supportive throughout session and cln and pt discussed plans for the back pay and future life with enough $ monthly to live.    Collaboration of Care: Other: Continue to see providers at [facility]   Patient/Guardian was advised Release of Information must be obtained prior to any record release in order to collaborate their care with an outside provider. Patient/Guardian was advised if they have not alreay done so to contact the registration department to sign all necessary forms in order for [place] to release information regarding their care.    Consent: Patient/Guardian gives verbal consent for treatment and assignment of benefits for services provided during this visit. Patient/Guardian expressed understanding and agreed to proceed.          Assessment and plan: Counselor will continue to meet with patient to address treatment plan goals. Patient will continue to follow recommendations of providers and implement skills learned in session, practicing skills between sessions. Diagnosis:  PTSD    Suicidal/Homicidal: Nowithout intent/plan   Therapist Response: Assessed pt's current functioning and reviewed progress.. Assisted pt processing  stressors, negative thoughts.  Assisted pt processing for the management of his stressors.   Participation Level: Active     Follow Up Instructions: I discussed the assessment and treatment plan with the  patient. The patient was provided an opportunity to ask questions and all were answered. The patient agreed with the plan and demonstrated an understanding of the instructions.   The patient was advised to call back or seek an in-person evaluation if the symptoms worsen or if the condition fails to improve as anticipated.   I provided 45 minutes of non-face-to-face time during this encounter.     Brandan Robicheaux S, LCAS  06/22/23

## 2023-06-29 ENCOUNTER — Ambulatory Visit (INDEPENDENT_AMBULATORY_CARE_PROVIDER_SITE_OTHER): Payer: Medicare PPO | Admitting: Licensed Clinical Social Worker

## 2023-06-29 ENCOUNTER — Encounter (HOSPITAL_COMMUNITY): Payer: Self-pay | Admitting: Licensed Clinical Social Worker

## 2023-06-29 DIAGNOSIS — F4310 Post-traumatic stress disorder, unspecified: Secondary | ICD-10-CM | POA: Diagnosis not present

## 2023-06-29 NOTE — Progress Notes (Signed)
 Virtual Visit via Video Note  I connected with [name] Jackson [name] on 06/29/23 at 3:00-3:45pm by a video-enabled telemedicine application and verified that I am speaking with the correct person using two identifiers.   I discussed the limitations of evaluation and management by telemedicine and the availability of in person appointments. The patient expressed understanding and agreed to proceed.   LOCATION: Patient: Home Provider: Home Office   History of Present Illness: Pt was referred to therapy by the [facility] for PTSD.    Participation Level: Active     Type of Therapy: Virtual Individual therapy   Treatment Goal addressed: Meet with clinician in person weekly for therapy to monitor for progress towards goals and address any barriers to success; Reduce depression from average severity level of 6/10 down to a 4/10 in next 90 days by engaging in 1-2 positive coping skills for daily as part of developing self-care routine; Reduce average anxiety level from 7/10 down to 5/10 in next 90 days by utilizing 3-5 relaxation skills/grounding skills per day, such as mindful breathing, progressive muscle relaxation, positive visualizations   Progression towards Goal: Progressing   Interventions: CBT/Supportive/Psychoeducation   Summary: Patient presented for today's session on time and was alert, oriented x5, with no evidence or self-report of SI/HI or A/V H. Patient reported ongoing compliance with medication.  Clinician inquired about patient's current emotional ratings, as well as any significant changes in thoughts, feelings or behavior since previous session. Patient reported his emotional ratings 6/10 for depression, 6/10 for anxiety, 2/10 for anger/irritability. . [name] asked open ended questions.  Pt reports on his maladaptive behaviors and thinking patterns over the past week, which interferes with resolution of his trauma symptoms 1/3 X within the past week. Pt reported 1 nightmare over  the past week.[name] and pt reviewed his trauma symptoms over the past week. [name] asked open ended questions. We discussed the impact his stressors have had on pt as well as how he copes with them. Pt reports on his health, va dr visits, and pain level. [name] asked open ended questions. [name] and pt  focused on stressors pt is facing as he is also navigating all his health concerns. We discussed the impact these stressors have had on pt as well as how he copes with them. [name] and pt reviewed pt's authorization which is due to expire tomorrow. Pt is going to call [facility] to check on authorization.        Collaboration of Care: Other: Continue to see providers at [facility]   Patient/Guardian was advised Release of Information must be obtained prior to any record release in order to collaborate their care with an outside provider. Patient/Guardian was advised if they have not alreay done so to contact the registration department to sign all necessary forms in order for us  to release information regarding their care.    Consent: Patient/Guardian gives verbal consent for treatment and assignment of benefits for services provided during this visit. Patient/Guardian expressed understanding and agreed to proceed.          Assessment and plan: Counselor will continue to meet with patient to address treatment plan goals. Patient will continue to follow recommendations of providers and implement skills learned in session, practicing skills between sessions. Diagnosis:  PTSD    Suicidal/Homicidal: Nowithout intent/plan   Therapist Response: Assessed pt's current functioning and reviewed progress.. Assisted pt processing stressors, negative thoughts.  Assisted pt processing for the management of his stressors.   Participation  Level: Active     Follow Up Instructions: I discussed the assessment and treatment plan with the patient. The patient was provided an opportunity to ask questions and all were answered.  The patient agreed with the plan and demonstrated an understanding of the instructions.   The patient was advised to call back or seek an in-person evaluation if the symptoms worsen or if the condition fails to improve as anticipated.   I provided 45 minutes of non-face-to-face time during this encounter.     Jackson [name] S, LCAS  06/29/23

## 2023-07-06 ENCOUNTER — Ambulatory Visit (INDEPENDENT_AMBULATORY_CARE_PROVIDER_SITE_OTHER): Payer: Medicare PPO | Admitting: Licensed Clinical Social Worker

## 2023-07-06 ENCOUNTER — Encounter (HOSPITAL_COMMUNITY): Payer: Self-pay | Admitting: Licensed Clinical Social Worker

## 2023-07-06 DIAGNOSIS — F4310 Post-traumatic stress disorder, unspecified: Secondary | ICD-10-CM

## 2023-07-06 NOTE — Progress Notes (Signed)
Virtual Visit via Video Note  I connected with [name] [place] on 07/06/23 at 11:00-11:45pm by a video-enabled telemedicine application and verified that I am speaking with the correct person using two identifiers.   I discussed the limitations of evaluation and management by telemedicine and the availability of in person appointments. The patient expressed understanding and agreed to proceed.   LOCATION: Patient: Home Provider: Home Office   History of Present Illness: Pt was referred to therapy by the [facility] for PTSD.    Participation Level: Active     Type of Therapy: Virtual Individual therapy   Treatment Goal addressed: Meet with clinician in person weekly for therapy to monitor for progress towards goals and address any barriers to success; Reduce depression from average severity level of 6/10 down to a 4/10 in next 90 days by engaging in 1-2 positive coping skills for daily as part of developing self-care routine; Reduce average anxiety level from 7/10 down to 5/10 in next 90 days by utilizing 3-5 relaxation skills/grounding skills per day, such as mindful breathing, progressive muscle relaxation, positive visualizations   Progression towards Goal: Progressing   Interventions: CBT/Supportive/Psychoeducation   Summary: Patient presented for today's session on time and was alert, oriented x5, with no evidence or self-report of SI/HI or A/V H. Patient reported ongoing compliance with medication.  Clinician inquired about patient's current emotional ratings, as well as any significant changes in thoughts, feelings or behavior since previous session. Patient reported his emotional ratings 6/10 for depression, 6/10 for anxiety, 2/10 for anger/irritability. [name] asked open ended questions.  Pt reports on his maladaptive behaviors and thinking patterns over the past week, which interferes with resolution of his trauma symptoms 1/3 X within the past week. Pt reported 1 nightmare over  the past week.[name] and pt reviewed his trauma symptoms over the past week. [name] and pt discussed medication minipress for nightmares and cln suggested pt talk to his PCP about the medication, which can assist in his sleep routine. [name] asked open ended questions. We discussed the impact his stressors have had on pt as well as how he copes with them. Pt reports on his health, va dr visits, and pain level. [name] asked open ended questions. [name] and pt  focused on stressors pt is facing as he is also navigating all his health concerns. We discussed the impact these stressors have had on pt as well as how he copes with them. [name] told pt his VA authorization to 2/26. Pt discussed his daughter and granddaughters who are having relationship issues. [name] provided psychoeducation on toxic relationships and power.       Collaboration of Care: Other: Continue to see providers at [facility]   Patient/Guardian was advised Release of Information must be obtained prior to any record release in order to collaborate their care with an outside provider. Patient/Guardian was advised if they have not alreay done so to contact the registration department to sign all necessary forms in order for [place] to release information regarding their care.    Consent: Patient/Guardian gives verbal consent for treatment and assignment of benefits for services provided during this visit. Patient/Guardian expressed understanding and agreed to proceed.          Assessment and plan: Counselor will continue to meet with patient to address treatment plan goals. Patient will continue to follow recommendations of providers and implement skills learned in session, practicing skills between sessions. Diagnosis:  PTSD    Suicidal/Homicidal: Nowithout intent/plan   Therapist  Response: Assessed pt's current functioning and reviewed progress.. Assisted pt processing stressors, negative thoughts.  Assisted pt processing for the management of his stressors.    Participation Level: Active     Follow Up Instructions: I discussed the assessment and treatment plan with the patient. The patient was provided an opportunity to ask questions and all were answered. The patient agreed with the plan and demonstrated an understanding of the instructions.   The patient was advised to call back or seek an in-person evaluation if the symptoms worsen or if the condition fails to improve as anticipated.   I provided 45 minutes of non-face-to-face time during this encounter.     Katrell Milhorn S, LCAS  07/06/23

## 2023-07-13 ENCOUNTER — Encounter (HOSPITAL_COMMUNITY): Payer: Self-pay

## 2023-07-13 ENCOUNTER — Ambulatory Visit (HOSPITAL_COMMUNITY): Payer: Self-pay | Admitting: Licensed Clinical Social Worker

## 2023-07-14 ENCOUNTER — Ambulatory Visit (HOSPITAL_COMMUNITY): Payer: Medicare PPO | Admitting: Licensed Clinical Social Worker

## 2023-07-14 DIAGNOSIS — F4310 Post-traumatic stress disorder, unspecified: Secondary | ICD-10-CM | POA: Diagnosis not present

## 2023-07-17 ENCOUNTER — Encounter (HOSPITAL_COMMUNITY): Payer: Self-pay | Admitting: Licensed Clinical Social Worker

## 2023-07-17 NOTE — Progress Notes (Signed)
 Virtual Visit via Video Note  I connected with [name] [name] on 07/14/23 at 3-3:45pm by a video-enabled telemedicine application and verified that I am speaking with the correct person using two identifiers.   I discussed the limitations of evaluation and management by telemedicine and the availability of in person appointments. The patient expressed understanding and agreed to proceed.   LOCATION: Patient: Home Provider: Home Office   History of Present Illness: Pt was referred to therapy by the [facility] for PTSD.    Participation Level: Active     Type of Therapy: Virtual Individual therapy   Treatment Goal addressed: Meet with clinician weekly for therapy to monitor for progress towards goals and address any barriers to success; Reduce depression from average severity level of 6/10 down to a 4/10 in next 90 days by engaging in 1-2 positive coping skills for daily as part of developing self-care routine; Reduce average anxiety level from 7/10 down to 5/10 in next 90 days by utilizing 3-5 relaxation skills/grounding skills per day, such as mindful breathing, progressive muscle relaxation, positive visualizations   Progression towards Goal: Progressing   Interventions: CBT/Supportive/Psychoeducation   Summary: Patient presented for today's session on time and was alert, oriented x5, with no evidence or self-report of SI/HI or A/V H. Patient reported ongoing compliance with medication.  Clinician inquired about patient's current emotional ratings, as well as any significant changes in thoughts, feelings or behavior since previous session. Patient reported his emotional ratings 6/10 for depression, 6/10 for anxiety, 2/10 for anger/irritability. [name] asked open ended questions.  Pt reports on his maladaptive behaviors and thinking patterns over the past week, which interferes with resolution of his trauma symptoms 1/3 X within the past week. Pt reported 1 nightmare over the past  week.[name] and pt reviewed his trauma symptoms over the past week. [name] and pt discussed medication minipress for nightmares and cln suggested pt talk to his PCP about the medication, which can assist in his sleep routine. [name] asked open ended questions. We discussed the impact his stressors have had on pt as well as how he copes with them. Pt reports on his health, va dr visits, and pain level. [name] asked open ended questions. [name] and pt  focused on stressors pt is facing as he is also navigating all his health concerns. We discussed the impact these stressors have had on pt as well as how he copes with them. [name] told pt his VA authorization to 2/26. Again, pt discussed his daughter and granddaughters who are having relationship issues. [name] provided psychoeducation on toxic relationships and power.       Collaboration of Care: Other: Continue to see providers at [facility]   Patient/Guardian was advised Release of Information must be obtained prior to any record release in order to collaborate their care with an outside provider. Patient/Guardian was advised if they have not alreay done so to contact the registration department to sign all necessary forms in order for [place] to release information regarding their care.    Consent: Patient/Guardian gives verbal consent for treatment and assignment of benefits for services provided during this visit. Patient/Guardian expressed understanding and agreed to proceed.          Assessment and plan: Counselor will continue to meet with patient to address treatment plan goals. Patient will continue to follow recommendations of providers and implement skills learned in session, practicing skills between sessions. Diagnosis:  PTSD    Suicidal/Homicidal: Nowithout intent/plan   Therapist Response:  Assessed pt's current functioning and reviewed progress.. Assisted pt processing stressors, negative thoughts.  Assisted pt processing for the management of his stressors.    Participation Level: Active     Follow Up Instructions: I discussed the assessment and treatment plan with the patient. The patient was provided an opportunity to ask questions and all were answered. The patient agreed with the plan and demonstrated an understanding of the instructions.   The patient was advised to call back or seek an in-person evaluation if the symptoms worsen or if the condition fails to improve as anticipated.   I provided 45 minutes of non-face-to-face time during this encounter.     Jackson [name] S, LCAS  07/14/23

## 2023-07-20 ENCOUNTER — Ambulatory Visit (INDEPENDENT_AMBULATORY_CARE_PROVIDER_SITE_OTHER): Payer: Medicare PPO | Admitting: Licensed Clinical Social Worker

## 2023-07-20 ENCOUNTER — Encounter (HOSPITAL_COMMUNITY): Payer: Self-pay | Admitting: Licensed Clinical Social Worker

## 2023-07-20 DIAGNOSIS — F4310 Post-traumatic stress disorder, unspecified: Secondary | ICD-10-CM

## 2023-07-20 NOTE — Progress Notes (Signed)
 Virtual Visit via Video Note  I connected with [name] [place] on 07/20/23/25 at 10-10:45am by a video-enabled telemedicine application and verified that I am speaking with the correct person using two identifiers.   I discussed the limitations of evaluation and management by telemedicine and the availability of in person appointments. The patient expressed understanding and agreed to proceed.   LOCATION: Patient: Home Provider: Home Office   History of Present Illness: Pt was referred to therapy by the [facility] for PTSD.    Participation Level: Active     Type of Therapy: Virtual Individual therapy   Treatment Goal addressed: Meet with clinician weekly for therapy to monitor for progress towards goals and address any barriers to success; Reduce depression from average severity level of 6/10 down to a 4/10 in next 90 days by engaging in 1-2 positive coping skills for daily as part of developing self-care routine; Reduce average anxiety level from 7/10 down to 5/10 in next 90 days by utilizing 3-5 relaxation skills/grounding skills per day, such as mindful breathing, progressive muscle relaxation, positive visualizations   Progression towards Goal: Progressing   Interventions: CBT/Supportive/Psychoeducation   Summary: Patient presented for today's session on time and was alert, oriented x5, with no evidence or self-report of SI/HI or A/V H. Patient reported ongoing compliance with medication.  Clinician inquired about patient's current emotional ratings, as well as any significant changes in thoughts, feelings or behavior since previous session. Patient reported his emotional ratings 6/10 for depression, 6/10 for anxiety, 2/10 for anger/irritability. [name] asked open ended questions.  Pt reports on his maladaptive behaviors and thinking patterns over the past week, which interferes with resolution of his trauma symptoms 1/3 X within the past week. Pt reported 1 nightmare over the past  week.[name] and pt reviewed his trauma symptoms over the past week. [name] and pt discussed medication minipress for nightmares and cln suggested pt talk to his PCP about the medication, which can assist in his sleep routine. [name] asked open ended questions. We discussed the impact his stressors have had on pt as well as how he copes with them. Pt reports on his health, va dr visits, and pain level. [name] asked open ended questions. [name] and pt  focused on stressors pt is facing as he is also navigating all his health concerns. We discussed the impact these stressors have had on pt as well as how he copes with them. Again, pt discussed his daughter and granddaughters who are having relationship issues. Again, cln provided psychoeducation on toxic relationships and power.       Collaboration of Care: Other: Continue to see providers at [facility]   Patient/Guardian was advised Release of Information must be obtained prior to any record release in order to collaborate their care with an outside provider. Patient/Guardian was advised if they have not alreay done so to contact the registration department to sign all necessary forms in order for [place] to release information regarding their care.    Consent: Patient/Guardian gives verbal consent for treatment and assignment of benefits for services provided during this visit. Patient/Guardian expressed understanding and agreed to proceed.          Assessment and plan: Counselor will continue to meet with patient to address treatment plan goals. Patient will continue to follow recommendations of providers and implement skills learned in session, practicing skills between sessions. Diagnosis:  PTSD    Suicidal/Homicidal: Nowithout intent/plan   Therapist Response: Assessed pt's current functioning and reviewed progress.Marland Kitchen  Assisted pt processing stressors, negative thoughts.  Assisted pt processing for the management of his stressors.   Participation Level: Active      Follow Up Instructions: I discussed the assessment and treatment plan with the patient. The patient was provided an opportunity to ask questions and all were answered. The patient agreed with the plan and demonstrated an understanding of the instructions.   The patient was advised to call back or seek an in-person evaluation if the symptoms worsen or if the condition fails to improve as anticipated.   I provided 45 minutes of non-face-to-face time during this encounter.     Pamela Intrieri S, LCAS  07/20/23

## 2023-07-27 ENCOUNTER — Ambulatory Visit (INDEPENDENT_AMBULATORY_CARE_PROVIDER_SITE_OTHER): Payer: Medicare PPO | Admitting: Licensed Clinical Social Worker

## 2023-07-27 DIAGNOSIS — F4310 Post-traumatic stress disorder, unspecified: Secondary | ICD-10-CM

## 2023-08-02 ENCOUNTER — Encounter (HOSPITAL_COMMUNITY): Payer: Self-pay | Admitting: Licensed Clinical Social Worker

## 2023-08-02 DIAGNOSIS — F4310 Post-traumatic stress disorder, unspecified: Secondary | ICD-10-CM | POA: Insufficient documentation

## 2023-08-02 NOTE — Progress Notes (Signed)
 Virtual Visit via Video Note  I connected with [name] [place] on 07/27/23/25 at 10-10:45am by a video-enabled telemedicine application and verified that I am speaking with the correct person using two identifiers.   I discussed the limitations of evaluation and management by telemedicine and the availability of in person appointments. The patient expressed understanding and agreed to proceed.   LOCATION: Patient: Home Provider: Home Office   History of Present Illness: Pt was referred to therapy by the [facility] for PTSD.    Participation Level: Active     Type of Therapy: Virtual Individual therapy   Treatment Goal addressed: Meet with clinician weekly for therapy to monitor for progress towards goals and address any barriers to success; Reduce depression from average severity level of 6/10 down to a 4/10 in next 90 days by engaging in 1-2 positive coping skills for daily as part of developing self-care routine; Reduce average anxiety level from 7/10 down to 5/10 in next 90 days by utilizing 3-5 relaxation skills/grounding skills per day, such as mindful breathing, progressive muscle relaxation, positive visualizations   Progression towards Goal: Progressing   Interventions: CBT/Supportive/Psychoeducation   Summary: Patient presented for today's session on time and was alert, oriented x5, with no evidence or self-report of SI/HI or A/V H. Patient reported ongoing compliance with medication.  Clinician inquired about patient's current emotional ratings, as well as any significant changes in thoughts, feelings or behavior since previous session. Patient reported his emotional ratings 6/10 for depression, 6/10 for anxiety, 2/10 for anger/irritability. [name] asked open ended questions.  Pt reports on his maladaptive behaviors and thinking patterns over the past week, which interferes with resolution of his trauma symptoms 1/3 X within the past week. Pt reported 1 nightmare over the past  week.[name] and pt reviewed his trauma symptoms over the past week. [name] and pt discussed medication minipress for nightmares and cln suggested pt talk to his PCP about the medication, which can assist in his sleep routine. [name] provided psyhoeducation on sleep hygiene. We discussed the impact his stressors have had on pt as well as how he copes with them. Pt reports on his health, va dr visits, and pain level. [name] asked open ended questions. [name] and pt  focused on stressors pt is facing as he is also navigating all his health concerns. [name] provided psychoeducation on the impact these stressors have on pt, emotional and physical health.       Collaboration of Care: Other: Continue to see providers at [facility]   Patient/Guardian was advised Release of Information must be obtained prior to any record release in order to collaborate their care with an outside provider. Patient/Guardian was advised if they have not alreay done so to contact the registration department to sign all necessary forms in order for [place] to release information regarding their care.    Consent: Patient/Guardian gives verbal consent for treatment and assignment of benefits for services provided during this visit. Patient/Guardian expressed understanding and agreed to proceed.          Assessment and plan: Counselor will continue to meet with patient to address treatment plan goals. Patient will continue to follow recommendations of providers and implement skills learned in session, practicing skills between sessions. Diagnosis:  PTSD    Suicidal/Homicidal: Nowithout intent/plan   Therapist Response: Assessed pt's current functioning and reviewed progress.. Assisted pt processing stressors, negative thoughts.  Assisted pt processing for the management of his stressors.   Participation Level: Active  Follow Up Instructions: I discussed the assessment and treatment plan with the patient. The patient was provided an opportunity to ask  questions and all were answered. The patient agreed with the plan and demonstrated an understanding of the instructions.   The patient was advised to call back or seek an in-person evaluation if the symptoms worsen or if the condition fails to improve as anticipated.   I provided 45 minutes of non-face-to-face time during this encounter.     Lakeya Mulka S, LCAS  07/27/23

## 2023-08-03 ENCOUNTER — Ambulatory Visit (INDEPENDENT_AMBULATORY_CARE_PROVIDER_SITE_OTHER): Payer: Medicare PPO | Admitting: Licensed Clinical Social Worker

## 2023-08-03 ENCOUNTER — Telehealth: Payer: Self-pay

## 2023-08-03 DIAGNOSIS — F4310 Post-traumatic stress disorder, unspecified: Secondary | ICD-10-CM | POA: Diagnosis not present

## 2023-08-03 NOTE — Telephone Encounter (Signed)
 Pt has been referred for positive cologuard... Pt denies any other issues at this time...   I wanted to see if either of you would be willing to see pt at an OV as he is over 78 years of age and needs OV before scheduling colonoscopy.Jackson KitchenMarland [name]

## 2023-08-10 ENCOUNTER — Ambulatory Visit (HOSPITAL_COMMUNITY): Payer: Medicare PPO | Admitting: Licensed Clinical Social Worker

## 2023-08-10 ENCOUNTER — Encounter (HOSPITAL_COMMUNITY): Payer: Self-pay | Admitting: Licensed Clinical Social Worker

## 2023-08-10 NOTE — Progress Notes (Signed)
 Virtual Visit via Video Note  I connected with [name] [place] on 08/03/23/25 at 10-10:45am by a video-enabled telemedicine application and verified that I am speaking with the correct person using two identifiers.   I discussed the limitations of evaluation and management by telemedicine and the availability of in person appointments. The patient expressed understanding and agreed to proceed.   LOCATION: Patient: Home Provider: Home Office   History of Present Illness: Pt was referred to therapy by the [facility] for PTSD.    Participation Level: Active     Type of Therapy: Virtual Individual therapy   Treatment Goal addressed: Meet with clinician weekly for therapy to monitor for progress towards goals and address any barriers to success; Reduce depression from average severity level of 6/10 down to a 4/10 in next 90 days by engaging in 1-2 positive coping skills for daily as part of developing self-care routine; Reduce average anxiety level from 7/10 down to 5/10 in next 90 days by utilizing 3-5 relaxation skills/grounding skills per day, such as mindful breathing, progressive muscle relaxation, positive visualizations   Progression towards Goal: Progressing   Interventions: CBT/Supportive/Psychoeducation   Summary: Patient presented for today's session on time and was alert, oriented x5, with no evidence or self-report of SI/HI or A/V H. Patient reported ongoing compliance with medication.  Clinician inquired about patient's current emotional ratings, as well as any significant changes in thoughts, feelings or behavior since previous session. Patient reported his emotional ratings 6/10 for depression, 6/10 for anxiety, 2/10 for anger/irritability. [name] asked open ended questions.  Pt reports on his maladaptive behaviors and thinking patterns over the past week, which interferes with resolution of his trauma symptoms 1/3 X within the past week. Pt reported 1 nightmare over the past  week.[name] and pt reviewed his trauma symptoms over the past week. [name] and pt discussed medication minipress for nightmares and cln suggested pt talk to his PCP about the medication, which can assist in his sleep routine. Again, cln provided psyhoeducation on sleep hygiene. We discussed the impact his stressors have had on pt as well as how he copes with them. Pt reports on his health, va dr visits, and pain level. [name] asked open ended questions. [name] and pt  focused on stressors pt is facing as he is also navigating all his health concerns. [name] provided psychoeducation on the impact these stressors have on pt, emotional and physical health. Pt discussed his family relationships (daughter and 2 granddaughters. "I feel helpless in the chaotic relationship." [name] used CBT to help pt understand how to deal with something he has no control over.      Collaboration of Care: Other: Continue to see providers at [facility]   Patient/Guardian was advised Release of Information must be obtained prior to any record release in order to collaborate their care with an outside provider. Patient/Guardian was advised if they have not alreay done so to contact the registration department to sign all necessary forms in order for [place] to release information regarding their care.    Consent: Patient/Guardian gives verbal consent for treatment and assignment of benefits for services provided during this visit. Patient/Guardian expressed understanding and agreed to proceed.          Assessment and plan: Counselor will continue to meet with patient to address treatment plan goals. Patient will continue to follow recommendations of providers and implement skills learned in session, practicing skills between sessions. Diagnosis:  PTSD    Suicidal/Homicidal: Nowithout intent/plan  Therapist Response: Assessed pt's current functioning and reviewed progress.. Assisted pt processing stressors, negative thoughts.  Assisted pt processing for  the management of his stressors.   Participation Level: Active     Follow Up Instructions: I discussed the assessment and treatment plan with the patient. The patient was provided an opportunity to ask questions and all were answered. The patient agreed with the plan and demonstrated an understanding of the instructions.   The patient was advised to call back or seek an in-person evaluation if the symptoms worsen or if the condition fails to improve as anticipated.   I provided 45 minutes of non-face-to-face time during this encounter.     Levi Klaiber S, LCAS  08/03/23

## 2023-08-14 ENCOUNTER — Telehealth: Payer: Self-pay | Admitting: Gastroenterology

## 2023-08-14 ENCOUNTER — Telehealth: Payer: Self-pay

## 2023-08-14 ENCOUNTER — Other Ambulatory Visit: Payer: Self-pay

## 2023-08-14 DIAGNOSIS — R195 Other fecal abnormalities: Secondary | ICD-10-CM

## 2023-08-14 MED ORDER — GOLYTELY 236 G PO SOLR: 4000.0000 mL | Freq: Once | ORAL | 0 refills | Status: AC

## 2023-08-14 NOTE — Telephone Encounter (Signed)
 Deanna Artis called in from the [place] to check on the status of a referral. I informed her that the patient was contacted on 08/03/23 at 4:57 PM by Nurse Shawna Orleans and again on 08/04/23 at 4:28 PM by Elige Ko. Deanna Artis stated that she will try to reach the patient and have him call us back. I informed her that our office closes at 12:00 PM on Friday, and she acknowledged that she would inform the patient of our early closure today.

## 2023-08-14 NOTE — Telephone Encounter (Signed)
 Gastroenterology Pre-Procedure Review  Request Date: 09/07/23 Requesting Physician: Dr. Allegra Lai  PATIENT REVIEW QUESTIONS: The patient responded to the following health history questions as indicated:    1. Are you having any GI issues? no gi issues reported by patient. Referral noted "Positive FIT" 2. Do you have a personal history of Polyps? no 3. Do you have a family history of Colon Cancer or Polyps? no 4. Diabetes Mellitus? no 5. Joint replacements in the past 12 months?yes (right shoulder replacement 3 months ago) 6. Major health problems in the past 3 months?no 7. Any artificial heart valves, MVP, or defibrillator?no    MEDICATIONS & ALLERGIES:    Patient reports the following regarding taking any anticoagulation/antiplatelet therapy:   Plavix, Coumadin, Eliquis, Xarelto, Lovenox, Pradaxa, Brilinta, or Effient? no Aspirin? no  Patient confirms/reports the following medications:  Current Outpatient Medications  Medication Sig Dispense Refill   clindamycin (CLEOCIN) 150 MG capsule Take 2 capsules (300 mg total) by mouth 3 (three) times daily. 42 capsule 0   sulfamethoxazole-trimethoprim (BACTRIM DS) 800-160 MG tablet Take 1 tablet by mouth 2 (two) times daily. 14 tablet 0   traZODone (DESYREL) 50 MG tablet Take 50 mg by mouth at bedtime.     No current facility-administered medications for this visit.   Facility-Administered Medications Ordered in Other Visits  Medication Dose Route Frequency Provider Last Rate Last Admin   albuterol (PROVENTIL) (2.5 MG/3ML) 0.083% nebulizer solution 2.5 mg  2.5 mg Nebulization Once Shane Crutch, MD        Patient confirms/reports the following allergies:  Allergies  Allergen Reactions   Penicillins Rash and Other (See Comments)    Reaction: nervousness Has patient had a PCN reaction causing immediate rash, facial/tongue/throat swelling, SOB or lightheadedness with hypotension: Yes Has patient had a PCN reaction causing severe rash  involving mucus membranes or skin necrosis: No Has patient had a PCN reaction that required hospitalization: No Has patient had a PCN reaction occurring within the last 10 years: No If all of the above answers are "NO", then may proceed with Cephalosporin use.     No orders of the defined types were placed in this encounter.   AUTHORIZATION INFORMATION Primary Insurance: 1D#: Group #:  Secondary Insurance: 1D#: Group #:  SCHEDULE INFORMATION: Date: 09/07/23 Time: Location: [facility]

## 2023-08-17 ENCOUNTER — Ambulatory Visit (HOSPITAL_COMMUNITY): Payer: Medicare PPO | Admitting: Licensed Clinical Social Worker

## 2023-08-17 ENCOUNTER — Encounter (HOSPITAL_COMMUNITY): Payer: Self-pay | Admitting: Licensed Clinical Social Worker

## 2023-08-17 DIAGNOSIS — F4310 Post-traumatic stress disorder, unspecified: Secondary | ICD-10-CM

## 2023-08-17 NOTE — Progress Notes (Signed)
 Virtual Visit via Video Note  I connected with [name] [name] on 08/17/23/25 at 10-10:45am by a video-enabled telemedicine application and verified that I am speaking with the correct person using two identifiers.   I discussed the limitations of evaluation and management by telemedicine and the availability of in person appointments. The patient expressed understanding and agreed to proceed.   LOCATION: Patient: Home Provider: Home Office   History of Present Illness: Pt was referred to therapy by the [facility] for PTSD.    Participation Level: Active     Type of Therapy: Virtual Individual therapy   Treatment Goal addressed: Meet with clinician weekly for therapy to monitor for progress towards goals and address any barriers to success; Reduce depression from average severity level of 6/10 down to a 4/10 in next 6 mos by engaging in 1-2 positive coping skills for daily as part of developing self-care routine; Reduce average anxiety level from 7/10 down to 5/10 in next 6 mos by utilizing 3-5 relaxation skills/grounding skills per day, such as mindful breathing, progressive muscle relaxation, positive visualizations   Progression towards Goal: Progressing   Interventions: CBT/Supportive/Psychoeducation   Summary: Patient presented for today's session on time and was alert, oriented x5, with no evidence or self-report of SI/HI or A/V H. Patient reported ongoing compliance with medication.  Clinician inquired about patient's current emotional ratings, as well as any significant changes in thoughts, feelings or behavior since previous session. Patient reported his emotional ratings 6/10 for depression, 6/10 for anxiety, 2/10 for anger/irritability. [name] asked open ended questions.  Pt reports on his maladaptive behaviors and thinking patterns over the past week, which interferes with resolution of his trauma symptoms 1/3 X within the past week. Pt reported 1 nightmare over the past  week.[name] and pt reviewed his trauma symptoms over the past week. [name] and pt discussed medication minipress for nightmares and cln suggested pt talk to his PCP about the medication, which can assist in his sleep routine. Again, cln provided psyhoeducation on sleep hygiene and suggested pt discuss with Pcp to add medication for night mares.  We discussed the impact his stressors have had on pt as well as how he copes with them. Pt reports on his health, va dr visits, and pain level. Pt has a dr appt tomorrow at the [facility] about shots in his back. Pt also has another appt next week at [place]. [name] asked open ended questions. [name] and pt  focused on stressors pt is facing as he is also navigating all his health concerns. [name] provided psychoeducation on the impact these stressors have on pt, emotional and physical health.  Clinician utilized MI OARS to reflect and summarize thoughts and feelings about this new normal life he is experiencing. Clinician discussed the importance of focusing on the here and now, what he can control.    Collaboration of Care: Other: Continue to see providers at [facility]   Patient/Guardian was advised Release of Information must be obtained prior to any record release in order to collaborate their care with an outside provider. Patient/Guardian was advised if they have not alreay done so to contact the registration department to sign all necessary forms in order for [place] to release information regarding their care.    Consent: Patient/Guardian gives verbal consent for treatment and assignment of benefits for services provided during this visit. Patient/Guardian expressed understanding and agreed to proceed.          Assessment and plan: Counselor will continue to  meet with patient to address treatment plan goals. Patient will continue to follow recommendations of providers and implement skills learned in session, practicing skills between sessions. Diagnosis:  PTSD    Suicidal/Homicidal:  Nowithout intent/plan   Therapist Response: Assessed pt's current functioning and reviewed progress.. Assisted pt processing stressors, negative thoughts.  Assisted pt processing for the management of his stressors.   Participation Level: Active     Follow Up Instructions: I discussed the assessment and treatment plan with the patient. The patient was provided an opportunity to ask questions and all were answered. The patient agreed with the plan and demonstrated an understanding of the instructions.   The patient was advised to call back or seek an in-person evaluation if the symptoms worsen or if the condition fails to improve as anticipated.   I provided 45 minutes of non-face-to-face time during this encounter.     Sherril Shipman S, LCAS  08/17/23

## 2023-08-24 ENCOUNTER — Ambulatory Visit (HOSPITAL_COMMUNITY): Admitting: Licensed Clinical Social Worker

## 2023-08-25 ENCOUNTER — Ambulatory Visit (HOSPITAL_COMMUNITY): Admitting: Licensed Clinical Social Worker

## 2023-08-25 DIAGNOSIS — F4310 Post-traumatic stress disorder, unspecified: Secondary | ICD-10-CM

## 2023-08-26 ENCOUNTER — Encounter (HOSPITAL_COMMUNITY): Payer: Self-pay | Admitting: Licensed Clinical Social Worker

## 2023-08-26 NOTE — Progress Notes (Signed)
 Virtual Visit via Video Note  I connected with [name]ig on 08/25/23 at 10-10:45am by a video-enabled telemedicine application and verified that I am speaking with the correct person using two identifiers.   I discussed the limitations of evaluation and management by telemedicine and the availability of in person appointments. The patient expressed understanding and agreed to proceed.   LOCATION: Patient: Home Provider: Home Office   History of Present Illness: Pt was referred to therapy by the [facility] for PTSD.    Participation Level: Active     Type of Therapy: Virtual Individual therapy   Treatment Goal addressed: Meet with clinician weekly for therapy to monitor for progress towards goals and address any barriers to success; Reduce depression from average severity level of 6/10 down to a 4/10 in next 6 mos by engaging in 1-2 positive coping skills for daily as part of developing self-care routine; Reduce average anxiety level from 7/10 down to 5/10 in next 6 mos by utilizing 3-5 relaxation skills/grounding skills per day, such as mindful breathing, progressive muscle relaxation, positive visualizations   Progression towards Goal: Progressing   Interventions: CBT/Supportive/Psychoeducation   Summary: Patient presented for today's session on time and was alert, oriented x5, with no evidence or self-report of SI/HI or A/V H. Patient reported ongoing compliance with medication.  Clinician inquired about patient's current emotional ratings, as well as any significant changes in thoughts, feelings or behavior since previous session. Patient reported his emotional ratings 6/10 for depression, 6/10 for anxiety, 2/10 for anger/irritability. [name] asked open ended questions.  Pt reports on his maladaptive behaviors and thinking patterns over the past week, which interferes with resolution of his trauma symptoms 1/3 X within the past week. Pt reported 1 nightmare over the past week.[name]  and pt reviewed his trauma symptoms over the past week. [name] and pt discussed medication minipress for nightmares and cln suggested pt talk to his PCP about the medication, which can assist in his sleep routine. Again, cln provided psyhoeducation on sleep hygiene and suggested pt discuss with Pcp to add medication for night mares.  "Im sleeping about 5 hours a night now." [name] and pt discussed magniseum to take to help him relax before sleeping. We discussed the impact his stressors have had on pt as well as how he copes with them. Pt reports on his health, va dr visits, and pain level. [name] asked open ended questions. [name] and pt  focused on stressors pt is facing as he is also navigating all his health concerns. [name] provided psychoeducation on the impact these stressors have on pt, emotional and physical health. Marland Kitchen    Collaboration of Care: Other: Continue to see providers at [facility]   Patient/Guardian was advised Release of Information must be obtained prior to any record release in order to collaborate their care with an outside provider. Patient/Guardian was advised if they have not alreay done so to contact the registration department to sign all necessary forms in order for [place] to release information regarding their care.    Consent: Patient/Guardian gives verbal consent for treatment and assignment of benefits for services provided during this visit. Patient/Guardian expressed understanding and agreed to proceed.          Assessment and plan: Counselor will continue to meet with patient to address treatment plan goals. Patient will continue to follow recommendations of providers and implement skills learned in session, practicing skills between sessions. Diagnosis:  PTSD    Suicidal/Homicidal: Nowithout intent/plan  Therapist Response: Assessed pt's current functioning and reviewed progress.. Assisted pt processing stressors, negative thoughts.  Assisted pt processing for the management of his  stressors.   Participation Level: Active     Follow Up Instructions: I discussed the assessment and treatment plan with the patient. The patient was provided an opportunity to ask questions and all were answered. The patient agreed with the plan and demonstrated an understanding of the instructions.   The patient was advised to call back or seek an in-person evaluation if the symptoms worsen or if the condition fails to improve as anticipated.   I provided 45 minutes of non-face-to-face time during this encounter.     Avaley Coop S, LCAS  08/25/23

## 2023-08-31 ENCOUNTER — Ambulatory Visit (INDEPENDENT_AMBULATORY_CARE_PROVIDER_SITE_OTHER): Admitting: Licensed Clinical Social Worker

## 2023-08-31 DIAGNOSIS — F4310 Post-traumatic stress disorder, unspecified: Secondary | ICD-10-CM

## 2023-09-01 ENCOUNTER — Encounter (HOSPITAL_COMMUNITY): Payer: Self-pay | Admitting: Licensed Clinical Social Worker

## 2023-09-01 NOTE — Progress Notes (Signed)
 Virtual Visit via Video Note  I connected with [name]au on 08/31/23 at 10-10:45am by a video-enabled telemedicine application and verified that I am speaking with the correct person using two identifiers.   I discussed the limitations of evaluation and management by telemedicine and the availability of in person appointments. The patient expressed understanding and agreed to proceed.   LOCATION: Patient: Home Provider: Home Office   History of Present Illness: Pt was referred to therapy by the [facility] for PTSD.    Participation Level: Active     Type of Therapy: Virtual Individual therapy   Treatment Goal addressed: Meet with clinician weekly for therapy to monitor for progress towards goals and address any barriers to success; Reduce depression from average severity level of 6/10 down to a 4/10 in next 6 mos by engaging in 1-2 positive coping skills for daily as part of developing self-care routine; Reduce average anxiety level from 7/10 down to 5/10 in next 6 mos by utilizing 3-5 relaxation skills/grounding skills per day, such as mindful breathing, progressive muscle relaxation, positive visualizations   Progression towards Goal: Progressing   Interventions: CBT/Supportive/Psychoeducation   Summary: Patient presented for today's session on time and was alert, oriented x5, with no evidence or self-report of SI/HI or A/V H. Patient reported ongoing compliance with medication.  Clinician inquired about patient's current emotional ratings, as well as any significant changes in thoughts, feelings or behavior since previous session. Patient reported his emotional ratings 6/10 for depression, 6/10 for anxiety, 2/10 for anger/irritability. [name] asked open ended questions.  Pt reports on his maladaptive behaviors and thinking patterns over the past week, which interferes with resolution of his trauma symptoms 1/3 X within the past week. Pt reported 1 nightmare over the past week.[name]  and pt reviewed his trauma symptoms over the past week. [name] and pt discussed medication minipress for nightmares and cln suggested pt talk to his PCP about the medication, which can assist in his sleep routine.  We discussed the impact his stressors have had on pt as well as how he copes with them. Pt reports on his health, family, va dr visits, and pain level. [name] asked open ended questions. [name] and pt  focused on stressors pt is facing as he is also navigating all his health concerns. Again, [name] provided psychoeducation on the impact these stressors have on pt, emotional and physical health. 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 [name] asked open ended questions.   Collaboration of Care: Other: Continue to see providers at [facility]   Patient/Guardian was advised Release of Information must be obtained prior to any record release in order to collaborate their care with an outside provider. Patient/Guardian was advised if they have not alreay done so to contact the registration department to sign all necessary forms in order for us  to release information regarding their care.    Consent: Patient/Guardian gives verbal consent for treatment and assignment of benefits for services provided during this visit. Patient/Guardian expressed understanding and agreed to proceed.          Assessment and plan: Counselor will continue to meet with patient to address treatment plan goals. Patient will continue to follow recommendations of providers and implement skills learned in session, practicing skills between sessions. Diagnosis:  PTSD    Suicidal/Homicidal: Nowithout intent/plan   Therapist Response: Assessed pt's current functioning and reviewed progress.. Assisted pt processing stressors, negative thoughts.  Assisted pt processing for the management of his stressors.   Participation Level: Active     Follow Up Instructions: I discussed the  assessment and treatment plan with the patient. The patient was provided an opportunity to ask questions and all were answered. The patient agreed with the plan and demonstrated an understanding of the instructions.   The patient was advised to call back or seek an in-person evaluation if the symptoms worsen or if the condition fails to improve as anticipated.   I provided 45 minutes of non-face-to-face time during this encounter.     Kelicia Youtz S, LCAS  08/31/23

## 2023-09-07 ENCOUNTER — Ambulatory Visit (INDEPENDENT_AMBULATORY_CARE_PROVIDER_SITE_OTHER): Admitting: Licensed Clinical Social Worker

## 2023-09-07 ENCOUNTER — Ambulatory Visit: Admit: 2023-09-07 | Admitting: Gastroenterology

## 2023-09-07 DIAGNOSIS — F4310 Post-traumatic stress disorder, unspecified: Secondary | ICD-10-CM | POA: Diagnosis not present

## 2023-09-07 SURGERY — COLONOSCOPY
Anesthesia: General

## 2023-09-09 ENCOUNTER — Encounter (HOSPITAL_COMMUNITY): Payer: Self-pay | Admitting: Licensed Clinical Social Worker

## 2023-09-09 NOTE — Progress Notes (Signed)
 Virtual Visit via Video Note  I connected with [name] Feltes on 09/07/23 at 10-10:45am by a video-enabled telemedicine application and verified that I am speaking with the correct person using two identifiers.   I discussed the limitations of evaluation and management by telemedicine and the availability of in person appointments. The patient expressed understanding and agreed to proceed.   LOCATION: Patient: Home Provider: Home Office   History of Present Illness: Pt was referred to therapy by the [facility] for PTSD.    Participation Level: Active     Type of Therapy: Virtual Individual therapy   Treatment Goal addressed: Meet with clinician weekly for therapy to monitor for progress towards goals and address any barriers to success; Reduce depression from average severity level of 6/10 down to a 4/10 in next 6 mos by engaging in 1-2 positive coping skills for daily as part of developing self-care routine; Reduce average anxiety level from 7/10 down to 5/10 in next 6 mos by utilizing 3-5 relaxation skills/grounding skills per day, such as mindful breathing, progressive muscle relaxation, positive visualizations   Progression towards Goal: Progressing   Interventions: CBT/Supportive/Psychoeducation   Summary: Patient presented for today's session on time and was alert, oriented x5, with no evidence or self-report of SI/HI or A/V H. Patient reported ongoing compliance with medication.  Clinician inquired about patient's current emotional ratings, as well as any significant changes in thoughts, feelings or behavior since previous session. Patient reported his emotional ratings 410 for depression, 6/10 for anxiety, 2/10 for anger/irritability. [name] asked open ended questions.  Pt reports on his maladaptive behaviors and thinking patterns over the past week, which interferes with resolution of his trauma symptoms 1/3 X within the past week. Pt reported 1 nightmare over the past week.[name]  and pt reviewed his trauma symptoms over the past week. [name] and pt discussed medication minipress for nightmares and cln suggested pt talk to his PCP about the medication, which can assist in his sleep routine.  We discussed the impact his stressors have had on pt as well as how he copes with them. Pt reports on his health, family, va dr visits, and pain level. [name] asked open ended questions. [name] and pt  focused on stressors pt is facing as he is also navigating all his health concerns. "I'm in a better place, seeing more veterans, interacting with family members." [name] encouraged pt to continue to interacting with others and not isolate. Collaboration of Care: Other: Continue to see providers at [facility]   Patient/Guardian was advised Release of Information must be obtained prior to any record release in order to collaborate their care with an outside provider. Patient/Guardian was advised if they have not alreay done so to contact the registration department to sign all necessary forms in order for us  to release information regarding their care.    Consent: Patient/Guardian gives verbal consent for treatment and assignment of benefits for services provided during this visit. Patient/Guardian expressed understanding and agreed to proceed.          Assessment and plan: Counselor will continue to meet with patient to address treatment plan goals. Patient will continue to follow recommendations of providers and implement skills learned in session, practicing skills between sessions. Diagnosis:  PTSD    Suicidal/Homicidal: Nowithout intent/plan   Therapist Response: Assessed pt's current functioning and reviewed progress.. Assisted pt processing stressors, negative thoughts.  Assisted pt processing for the management of his stressors.   Participation Level: Active  Follow Up Instructions: I discussed the assessment and treatment plan with the patient. The patient was provided an opportunity to ask  questions and all were answered. The patient agreed with the plan and demonstrated an understanding of the instructions.   The patient was advised to call back or seek an in-person evaluation if the symptoms worsen or if the condition fails to improve as anticipated.   I provided 45 minutes of non-face-to-face time during this encounter.     Jackson [name] S, LCAS  09/07/23

## 2023-09-14 ENCOUNTER — Encounter (HOSPITAL_COMMUNITY): Payer: Self-pay | Admitting: Licensed Clinical Social Worker

## 2023-09-14 ENCOUNTER — Ambulatory Visit (HOSPITAL_COMMUNITY): Admitting: Licensed Clinical Social Worker

## 2023-09-14 DIAGNOSIS — F4310 Post-traumatic stress disorder, unspecified: Secondary | ICD-10-CM | POA: Diagnosis not present

## 2023-09-14 NOTE — Progress Notes (Signed)
 Virtual Visit via Video Note  I connected with Jackson [name]. Magill on 09/14/23 at 10-10:45am by a video-enabled telemedicine application and verified that I am speaking with the correct person using two identifiers.   I discussed the limitations of evaluation and management by telemedicine and the availability of in person appointments. The patient expressed understanding and agreed to proceed.   LOCATION: Patient: Home Provider: Home Office   History of Present Illness: Pt was referred to therapy by the [facility] for PTSD.    Participation Level: Active     Type of Therapy: Virtual Individual therapy   Treatment Goal addressed: Meet with clinician weekly for therapy to monitor for progress towards goals and address any barriers to success; Reduce depression from average severity level of 6/10 down to a 4/10 in next 6 mos by engaging in 1-2 positive coping skills for daily as part of developing self-care routine; Reduce average anxiety level from 7/10 down to 5/10 in next 6 mos by utilizing 3-5 relaxation skills/grounding skills per day, such as mindful breathing, progressive muscle relaxation, positive visualizations   Progression towards Goal: Progressing   Interventions: CBT/Supportive/Psychoeducation   Summary: Patient presented for today's session on time and was alert, oriented x5, with no evidence or self-report of SI/HI or A/V H. Patient reported ongoing compliance with medication.  Clinician inquired about patient's current emotional ratings, as well as any significant changes in thoughts, feelings or behavior since previous session. Patient reported his emotional ratings 410 for depression, 6/10 for anxiety, 2/10 for anger/irritability. [name] asked open ended questions.  Pt reports on his maladaptive behaviors and thinking patterns over the past week, which interferes with resolution of his trauma symptoms 1/3 X within the past week. Pt reported 1 nightmare over the past week.[name]  and pt reviewed his trauma symptoms over the past week. [name] and pt discussed medication minipress for nightmares and pt has started taking magnesium to assit the pt in relaxing and readiness for sleep..  We discussed the impact his stressors have had on pt as well as how he copes with them. Pt reports on his health, family, va dr visits, and pain level. [name] asked open ended questions. [name] and pt  focused on stressors pt is facing as he is also navigating all his health concerns. "I continue to be in a better place, seeing more veterans, interacting with family members." [name] encouraged pt to continue to interacting with others and not isolate Pt shared about the veteran program he visited over the weekend and was surrounded by other veterans.Aaron Aas    Collaboration of Care: Other: Continue to see providers at [facility]   Patient/Guardian was advised Release of Information must be obtained prior to any record release in order to collaborate their care with an outside provider. Patient/Guardian was advised if they have not alreay done so to contact the registration department to sign all necessary forms in order for us  to release information regarding their care.    Consent: Patient/Guardian gives verbal consent for treatment and assignment of benefits for services provided during this visit. Patient/Guardian expressed understanding and agreed to proceed.          Assessment and plan: Counselor will continue to meet with patient to address treatment plan goals. Patient will continue to follow recommendations of providers and implement skills learned in session, practicing skills between sessions. Diagnosis:  PTSD    Suicidal/Homicidal: Nowithout intent/plan   Therapist Response: Assessed pt's current functioning and reviewed progress.. Assisted pt processing  stressors, negative thoughts.  Assisted pt processing for the management of his stressors.   Participation Level: Active     Follow Up Instructions: I  discussed the assessment and treatment plan with the patient. The patient was provided an opportunity to ask questions and all were answered. The patient agreed with the plan and demonstrated an understanding of the instructions.   The patient was advised to call back or seek an in-person evaluation if the symptoms worsen or if the condition fails to improve as anticipated.   I provided 45 minutes of non-face-to-face time during this encounter.     Tomeko Scoville S, LCAS  09/14/23

## 2023-09-17 ENCOUNTER — Telehealth: Payer: Self-pay

## 2023-09-17 NOTE — Telephone Encounter (Signed)
 Jelica from the [place] called to inquire about the patient's colonoscopy. I informed her that the patient had canceled his procedure, which was originally scheduled for September 07, 2023.  She asked if the patient had called back to reschedule, and I confirmed that he had not. Jelica requested that we cancel the referral. She also instructed that if the patient decides to call back in the future, we should advise [facility] to [facility].

## 2023-09-21 ENCOUNTER — Encounter (HOSPITAL_COMMUNITY): Payer: Self-pay | Admitting: Licensed Clinical Social Worker

## 2023-09-21 ENCOUNTER — Ambulatory Visit (HOSPITAL_COMMUNITY): Admitting: Licensed Clinical Social Worker

## 2023-09-21 DIAGNOSIS — F4310 Post-traumatic stress disorder, unspecified: Secondary | ICD-10-CM

## 2023-09-21 NOTE — Progress Notes (Signed)
 Virtual Visit via Video Note  I connected with Jackson [name]. Liddell on 09/21/23 at 10-10:45am by a video-enabled telemedicine application and verified that I am speaking with the correct person using two identifiers.   I discussed the limitations of evaluation and management by telemedicine and the availability of in person appointments. The patient expressed understanding and agreed to proceed.   LOCATION: Patient: Home Provider: Home Office   History of Present Illness: Pt was referred to therapy by the [facility] for PTSD.    Participation Level: Active     Type of Therapy: Virtual Individual therapy   Treatment Goal addressed: Meet with clinician weekly for therapy to monitor for progress towards goals and address any barriers to success; Reduce depression from average severity level of 6/10 down to a 4/10 in next 6 mos by engaging in 1-2 positive coping skills for daily as part of developing self-care routine; Reduce average anxiety level from 7/10 down to 5/10 in next 6 mos by utilizing 3-5 relaxation skills/grounding skills per day, such as mindful breathing, progressive muscle relaxation, positive visualizations   Progression towards Goal: Progressing   Interventions: CBT/Supportive/Psychoeducation   Summary: Patient presented for today'[name] session on time and was alert, oriented x5, with no evidence or self-report of SI/HI or A/V H. Patient reported ongoing compliance with medication.  Clinician inquired about patient'[name] current emotional ratings, as well as any significant changes in thoughts, feelings or behavior since previous session. Patient reported his emotional ratings 410 for depression, 4/10 for anxiety, 2/10 for anger/irritability. [name] asked open ended questions.  Pt reports on his maladaptive behaviors and thinking patterns over the past week, which interferes with resolution of his trauma symptoms 3/3 X within the past week. Pt reported 1 nightmare over the past week.[name]  and pt reviewed his trauma symptoms over the past week. Again, [name] and pt discussed medication minipress for nightmares and pt has started taking magnesium to assit the pt in relaxing and readiness for sleep..  We discussed the impact his stressors have had on pt as well as how he copes with them. Pt reports on his health, family, va dr visits, and pain level. [name] asked open ended questions. [name] and pt  focused on stressors pt is facing as he is also navigating all his health concerns. Again, pt reports, "I continue to be in a better place, seeing more veterans, interacting with family members."[name] encouraged pt to continue to interacting with others and not isolate. Pt shared about the upcoming veteran breakfast he will be having next weekend for 50-60  veterans.Jackson [name]    Collaboration of Care: Other: Continue to see providers at [facility]   Patient/Guardian was advised Release of Information must be obtained prior to any record release in order to collaborate their care with an outside provider. Patient/Guardian was advised if they have not alreay done so to contact the registration department to sign all necessary forms in order for us  to release information regarding their care.    Consent: Patient/Guardian gives verbal consent for treatment and assignment of benefits for services provided during this visit. Patient/Guardian expressed understanding and agreed to proceed.          Assessment and plan: Counselor will continue to meet with patient to address treatment plan goals. Patient will continue to follow recommendations of providers and implement skills learned in session, practicing skills between sessions. Diagnosis:  PTSD    Suicidal/Homicidal: Nowithout intent/plan   Therapist Response: Assessed pt'[name] current functioning and reviewed progress.Jackson [name]  Assisted pt processing stressors, negative thoughts.  Assisted pt processing for the management of his stressors.   Participation Level: Active      Follow Up Instructions: I discussed the assessment and treatment plan with the patient. The patient was provided an opportunity to ask questions and all were answered. The patient agreed with the plan and demonstrated an understanding of the instructions.   The patient was advised to call back or seek an in-person evaluation if the symptoms worsen or if the condition fails to improve as anticipated.   I provided 45 minutes of non-face-to-face time during this encounter.     Jackson [name], LCAS  09/21/23

## 2023-09-28 ENCOUNTER — Ambulatory Visit (HOSPITAL_COMMUNITY): Admitting: Licensed Clinical Social Worker

## 2023-09-28 DIAGNOSIS — F4310 Post-traumatic stress disorder, unspecified: Secondary | ICD-10-CM | POA: Diagnosis not present

## 2023-09-29 ENCOUNTER — Encounter (HOSPITAL_COMMUNITY): Payer: Self-pay | Admitting: Licensed Clinical Social Worker

## 2023-09-29 NOTE — Progress Notes (Signed)
 Virtual Visit via Video Note  I connected with [name] Boeding on 09/28/23 at 10-10:45am by a video-enabled telemedicine application and verified that I am speaking with the correct person using two identifiers.   I discussed the limitations of evaluation and management by telemedicine and the availability of in person appointments. The patient expressed understanding and agreed to proceed.   LOCATION: Patient: Home Provider: Home Office   History of Present Illness: Pt was referred to therapy by the [facility] for PTSD.    Participation Level: Active     Type of Therapy: Virtual Individual therapy   Treatment Goal addressed: Meet with clinician weekly for therapy to monitor for progress towards goals and address any barriers to success; Reduce depression from average severity level of 6/10 down to a 4/10 in next 6 mos by engaging in 1-2 positive coping skills for daily as part of developing self-care routine; Reduce average anxiety level from 7/10 down to 5/10 in next 6 mos by utilizing 3-5 relaxation skills/grounding skills per day, such as mindful breathing, progressive muscle relaxation, positive visualizations   Progression towards Goal: Progressing   Interventions: CBT/Supportive/Psychoeducation   Summary: Patient presented for today's session on time and was alert, oriented x5, with no evidence or self-report of SI/HI or A/V H. Patient reported ongoing compliance with medication.  Clinician inquired about patient's current emotional ratings, as well as any significant changes in thoughts, feelings or behavior since previous session. Patient reported his emotional ratings 410 for depression, 4/10 for anxiety, 410 for anger/irritability. [name] asked open ended questions.  Pt reports on his maladaptive behaviors and thinking patterns over the past week, which interferes with resolution of his trauma symptoms 3/3 X within the past week. Pt reported 1 nightmare over the past week.[name]  and pt reviewed his trauma symptoms over the past week.  We discussed the impact his stressors have had on pt as well as how he copes with them. Pt reports on his health, family, va dr visits, and pain level. [name] asked open ended questions. [name] and pt  focused on stressors pt is facing as he is also navigating all his health concerns. Again, pt reports, "I continue to be in a better place, seeing more veterans, interacting with family members."[name] encouraged pt to continue to interacting with others and not isolate. Pt shared about the veteran breakfast Saturday he will be having for 70  veterans, which insures his feeling of peace amongst his comrades.    Collaboration of Care: Other: Continue to see providers at [facility]   Patient/Guardian was advised Release of Information must be obtained prior to any record release in order to collaborate their care with an outside provider. Patient/Guardian was advised if they have not alreay done so to contact the registration department to sign all necessary forms in order for us  to release information regarding their care.    Consent: Patient/Guardian gives verbal consent for treatment and assignment of benefits for services provided during this visit. Patient/Guardian expressed understanding and agreed to proceed.          Assessment and plan: Counselor will continue to meet with patient to address treatment plan goals. Patient will continue to follow recommendations of providers and implement skills learned in session, practicing skills between sessions. Diagnosis:  PTSD    Suicidal/Homicidal: Nowithout intent/plan   Therapist Response: Assessed pt's current functioning and reviewed progress.. Assisted pt processing stressors, negative thoughts.  Assisted pt processing for the management of his stressors.  Participation Level: Active     Follow Up Instructions: I discussed the assessment and treatment plan with the patient. The patient was provided an  opportunity to ask questions and all were answered. The patient agreed with the plan and demonstrated an understanding of the instructions.   The patient was advised to call back or seek an in-person evaluation if the symptoms worsen or if the condition fails to improve as anticipated.   I provided 45 minutes of non-face-to-face time during this encounter.     Kaya Pottenger S, LCAS  09/28/23

## 2023-10-05 ENCOUNTER — Ambulatory Visit (INDEPENDENT_AMBULATORY_CARE_PROVIDER_SITE_OTHER): Admitting: Licensed Clinical Social Worker

## 2023-10-05 DIAGNOSIS — F4310 Post-traumatic stress disorder, unspecified: Secondary | ICD-10-CM | POA: Diagnosis not present

## 2023-10-11 ENCOUNTER — Encounter (HOSPITAL_COMMUNITY): Payer: Self-pay | Admitting: Licensed Clinical Social Worker

## 2023-10-11 NOTE — Progress Notes (Signed)
 Virtual Visit via Video Note  I connected with [name] Mcafee on 10/05/23 at 10-11am by a video-enabled telemedicine application and verified that I am speaking with the correct person using two identifiers.   I discussed the limitations of evaluation and management by telemedicine and the availability of in person appointments. The patient expressed understanding and agreed to proceed.   LOCATION: Patient: Home Provider: Home Office   History of Present Illness: Pt was referred to therapy by the [facility] for PTSD.    Participation Level: Active     Type of Therapy: Virtual Individual therapy   Treatment Goal addressed: Meet with clinician weekly for therapy to monitor for progress towards goals and address any barriers to success; Reduce depression from average severity level of 6/10 down to a 4/10 in next 6 mos by engaging in 1-2 positive coping skills for daily as part of developing self-care routine; Reduce average anxiety level from 7/10 down to 5/10 in next 6 mos by utilizing 3-5 relaxation skills/grounding skills per day, such as mindful breathing, progressive muscle relaxation, positive visualizations   Progression towards Goal: Progressing   Interventions: CBT/Supportive/Psychoeducation   Summary: Patient presented for today's session on time and was alert, oriented x5, with no evidence or self-report of SI/HI or A/V H. Patient reported ongoing compliance with medication.  Clinician inquired about patient's current emotional ratings, as well as any significant changes in thoughts, feelings or behavior since previous session. Patient reported his emotional ratings 410 for depression, 4/10 for anxiety, 410 for anger/irritability. [name] asked open ended questions.  Pt reports on his maladaptive behaviors and thinking patterns over the past week, which interferes with resolution of his trauma symptoms 3/3 X within the past week. Pt reported 1 nightmare over the past week.[name] and  pt reviewed his trauma symptoms over the past week.  We discussed the impact his stressors have had on pt as well as how he copes with them. Pt reports on his health, family, va dr visits, and pain level. [name] asked open ended questions. [name] and pt  focused on stressors pt is facing as he is also navigating all his health concerns. Pt reports on his memorial day  Comemorations that he will be attending and will assist in putting flags on veteran graves in [place]. [name] and pt discussed the 15 year anniversary of the [place] war. [name] thanked pt for his service. Aaron Aas    Collaboration of Care: Other: Continue to see providers at [facility]   Patient/Guardian was advised Release of Information must be obtained prior to any record release in order to collaborate their care with an outside provider. Patient/Guardian was advised if they have not alreay done so to contact the registration department to sign all necessary forms in order for us  to release information regarding their care.    Consent: Patient/Guardian gives verbal consent for treatment and assignment of benefits for services provided during this visit. Patient/Guardian expressed understanding and agreed to proceed.          Assessment and plan: Counselor will continue to meet with patient to address treatment plan goals. Patient will continue to follow recommendations of providers and implement skills learned in session, practicing skills between sessions. Diagnosis:  PTSD    Suicidal/Homicidal: Nowithout intent/plan   Therapist Response: Assessed pt's current functioning and reviewed progress.. Assisted pt processing stressors, negative thoughts.  Assisted pt processing for the management of his stressors.   Participation Level: Active     Follow  Up Instructions: I discussed the assessment and treatment plan with the patient. The patient was provided an opportunity to ask questions and all were answered. The patient agreed with the plan  and demonstrated an understanding of the instructions.   The patient was advised to call back or seek an in-person evaluation if the symptoms worsen or if the condition fails to improve as anticipated.   I provided 60 minutes of non-face-to-face time during this encounter.     Durk Carmen S, LCAS  10/05/23

## 2023-10-13 ENCOUNTER — Ambulatory Visit (INDEPENDENT_AMBULATORY_CARE_PROVIDER_SITE_OTHER): Admitting: Licensed Clinical Social Worker

## 2023-10-13 DIAGNOSIS — F4310 Post-traumatic stress disorder, unspecified: Secondary | ICD-10-CM

## 2023-10-14 ENCOUNTER — Encounter (HOSPITAL_COMMUNITY): Payer: Self-pay | Admitting: Licensed Clinical Social Worker

## 2023-10-14 NOTE — Progress Notes (Signed)
 Virtual Visit via Video Note  I connected with [name] Naval on 10/13/23 at 12-1pm by a video-enabled telemedicine application and verified that I am speaking with the correct person using two identifiers.   I discussed the limitations of evaluation and management by telemedicine and the availability of in person appointments. The patient expressed understanding and agreed to proceed.   LOCATION: Patient: Home Provider: Home Office   History of Present Illness: Pt was referred to therapy by the [facility] for PTSD.    Participation Level: Active     Type of Therapy: Virtual Individual therapy   Treatment Goal addressed: Meet with clinician weekly for therapy to monitor for progress towards goals and address any barriers to success; Reduce depression from average severity level of 6/10 down to a 4/10 in next 6 mos by engaging in 1-2 positive coping skills for daily as part of developing self-care routine; Reduce average anxiety level from 7/10 down to 5/10 in next 6 mos by utilizing 3-5 relaxation skills/grounding skills per day, such as mindful breathing, progressive muscle relaxation, positive visualizations   Progression towards Goal: Progressing   Interventions: CBT/Supportive/Psychoeducation   Summary: Patient presented for today's session on time and was alert, oriented x5, with no evidence or self-report of SI/HI or A/V H. Patient reported ongoing compliance with medication.  Clinician inquired about patient's current emotional ratings, as well as any significant changes in thoughts, feelings or behavior since previous session. Patient reported his emotional ratings 410 for depression, 4/10 for anxiety, 410 for anger/irritability. [name] asked open ended questions.  Pt reports on his maladaptive behaviors and thinking patterns over the past week, which interferes with resolution of his trauma symptoms 3/3 X within the past week. Pt reported 1 nightmare over the past week.[name] and pt  reviewed his trauma symptoms over the past week.  We discussed the impact his stressors have had on pt as well as how he copes with them. Pt reports on his health, family, va dr visits, and pain level. [name] asked open ended questions. [name] and pt  focused on stressors pt is facing as he is also navigating all his health concerns. Pt reports on his memorial day  Comemorations that he attended and his feelings surrounding  the day. Again, cln and pt discussed the 60 year anniversary of the [place] war.  Aaron Aas    Collaboration of Care: Other: Continue to see providers at [facility]   Patient/Guardian was advised Release of Information must be obtained prior to any record release in order to collaborate their care with an outside provider. Patient/Guardian was advised if they have not alreay done so to contact the registration department to sign all necessary forms in order for us  to release information regarding their care.    Consent: Patient/Guardian gives verbal consent for treatment and assignment of benefits for services provided during this visit. Patient/Guardian expressed understanding and agreed to proceed.          Assessment and plan: Counselor will continue to meet with patient to address treatment plan goals. Patient will continue to follow recommendations of providers and implement skills learned in session, practicing skills between sessions. Diagnosis:  PTSD    Suicidal/Homicidal: Nowithout intent/plan   Therapist Response: Assessed pt's current functioning and reviewed progress.. Assisted pt processing stressors, negative thoughts.  Assisted pt processing for the management of his stressors.   Participation Level: Active     Follow Up Instructions: I discussed the assessment and treatment plan with the  patient. The patient was provided an opportunity to ask questions and all were answered. The patient agreed with the plan and demonstrated an understanding of the instructions.   The  patient was advised to call back or seek an in-person evaluation if the symptoms worsen or if the condition fails to improve as anticipated.   I provided 60 minutes of non-face-to-face time during this encounter.     Miamarie Moll S, LCAS  10/13/23

## 2023-10-19 ENCOUNTER — Ambulatory Visit (INDEPENDENT_AMBULATORY_CARE_PROVIDER_SITE_OTHER): Admitting: Licensed Clinical Social Worker

## 2023-10-19 DIAGNOSIS — F4310 Post-traumatic stress disorder, unspecified: Secondary | ICD-10-CM | POA: Diagnosis not present

## 2023-10-20 ENCOUNTER — Encounter (HOSPITAL_COMMUNITY): Payer: Self-pay | Admitting: Licensed Clinical Social Worker

## 2023-10-20 NOTE — Progress Notes (Signed)
 Virtual Visit via Video Note  I connected with [name] Tenenbaum on 10/19/23 at 3-4pm by a video-enabled telemedicine application and verified that I am speaking with the correct person using two identifiers.   I discussed the limitations of evaluation and management by telemedicine and the availability of in person appointments. The patient expressed understanding and agreed to proceed.   LOCATION: Patient: Home Provider: Home Office   History of Present Illness: Pt was referred to therapy by the [facility] for PTSD.    Participation Level: Active     Type of Therapy: Virtual Individual therapy   Treatment Goal addressed: Meet with clinician weekly for therapy to monitor for progress towards goals and address any barriers to success; Reduce depression from average severity level of 6/10 down to a 4/10 in next 6 mos by engaging in 1-2 positive coping skills for daily as part of developing self-care routine; Reduce average anxiety level from 7/10 down to 5/10 in next 6 mos by utilizing 3-5 relaxation skills/grounding skills per day, such as mindful breathing, progressive muscle relaxation, positive visualizations   Progression towards Goal: Progressing   Interventions: CBT/Supportive/Psychoeducation   Summary: Patient presented for today's session on time and was alert, oriented x5, with no evidence or self-report of SI/HI or A/V H. Patient reported ongoing compliance with medication.  Clinician inquired about patient's current emotional ratings, as well as any significant changes in thoughts, feelings or behavior since previous session. Patient reported his emotional ratings 410 for depression, 4/10 for anxiety, 410 for anger/irritability. [name] asked open ended questions.  Pt reports on his maladaptive behaviors and thinking patterns over the past week, which interferes with resolution of his trauma symptoms 3/3 X within the past week. Pt reported 2 nightmare over the past week.[name] and pt  reviewed his trauma symptoms over the past week.  We discussed the impact his stressors have had on pt as well as how he copes with them. Pt reports on his health, family, va dr visits, and pain level. [name] asked open ended questions. [name] and pt  focused on stressors pt is facing as he is also navigating all his health concerns. 'I went to the dentist this morning and I'm getting new false teeth." [name] asked open ended questions..[name] use CBT to processed thoughts, feelings, and behaviors.   Collaboration of Care: Other: Continue to see providers at [facility]   Patient/Guardian was advised Release of Information must be obtained prior to any record release in order to collaborate their care with an outside provider. Patient/Guardian was advised if they have not alreay done so to contact the registration department to sign all necessary forms in order for us  to release information regarding their care.    Consent: Patient/Guardian gives verbal consent for treatment and assignment of benefits for services provided during this visit. Patient/Guardian expressed understanding and agreed to proceed.          Assessment and plan: Counselor will continue to meet with patient to address treatment plan goals. Patient will continue to follow recommendations of providers and implement skills learned in session, practicing skills between sessions. Diagnosis:  PTSD    Suicidal/Homicidal: Nowithout intent/plan   Therapist Response: Assessed pt's current functioning and reviewed progress.. Assisted pt processing stressors, negative thoughts.  Assisted pt processing for the management of his stressors.   Participation Level: Active     Follow Up Instructions: I discussed the assessment and treatment plan with the patient. The patient was provided an opportunity to ask  questions and all were answered. The patient agreed with the plan and demonstrated an understanding of the instructions.   The patient was advised to  call back or seek an in-person evaluation if the symptoms worsen or if the condition fails to improve as anticipated.   I provided 60 minutes of non-face-to-face time during this encounter.     Ryota Treece S, LCAS  10/19/23

## 2023-10-26 ENCOUNTER — Encounter (HOSPITAL_COMMUNITY): Payer: Self-pay | Admitting: Licensed Clinical Social Worker

## 2023-10-26 ENCOUNTER — Ambulatory Visit (HOSPITAL_COMMUNITY): Admitting: Licensed Clinical Social Worker

## 2023-10-26 DIAGNOSIS — F4310 Post-traumatic stress disorder, unspecified: Secondary | ICD-10-CM

## 2023-10-26 NOTE — Progress Notes (Signed)
 Virtual Visit via Video Note  I connected with [name]auge on 10/26/23 at 110-10:45am by a video-enabled telemedicine application and verified that I am speaking with the correct person using two identifiers.   I discussed the limitations of evaluation and management by telemedicine and the availability of in person appointments. The patient expressed understanding and agreed to proceed.   LOCATION: Patient: Home Provider: Home Office   History of Present Illness: Pt was referred to therapy by the [facility] for PTSD.    Participation Level: Active     Type of Therapy: Virtual Individual therapy   Treatment Goal addressed: Meet with clinician weekly for therapy to monitor for progress towards goals and address any barriers to success; Reduce depression from average severity level of 6/10 down to a 4/10 in next 6 mos by engaging in 1-2 positive coping skills for daily as part of developing self-care routine; Reduce average anxiety level from 7/10 down to 5/10 in next 6 mos by utilizing 3-5 relaxation skills/grounding skills per day, such as mindful breathing, progressive muscle relaxation, positive visualizations   Progression towards Goal: Progressing   Interventions: CBT/Supportive/Psychoeducation   Summary: Patient presented for today's session on time and was alert, oriented x5, with no evidence or self-report of SI/HI or A/V H. Patient reported ongoing compliance with medication.  Clinician inquired about patient's current emotional ratings, as well as any significant changes in thoughts, feelings or behavior since previous session. Patient reported his emotional ratings 410 for depression, 3/10 for anxiety, 310 for anger/irritability. [name] asked open ended questions.  Pt reports on his maladaptive behaviors and thinking patterns over the past week, which interferes with resolution of his trauma symptoms 3/3 X within the past week. Pt reported 3 nightmare over the past week.[name]  and pt reviewed his trauma symptoms over the past week.  We discussed the impact his stressors have had on pt as well as how he copes with them. Pt reports on his health, family, va dr visits, and pain level, which remains chronic. [name] asked open ended questions. [name] and pt  focused on stressors pt is facing as he is also navigating all his health concerns. 'I Met with my VSO because it looks like I'm going to be losing some of my [employer] disability. [name] asked open ended questions. "I will send you a copy of the letter. I need a letter from you as my therapist." [name] and pr reviewed the information necessary for the letter.[name] used CBT to processed thoughts, feelings, about this new information.Aaron Aas   Collaboration of Care: Other: Continue to see providers at [facility]   Patient/Guardian was advised Release of Information must be obtained prior to any record release in order to collaborate their care with an outside provider. Patient/Guardian was advised if they have not alreay done so to contact the registration department to sign all necessary forms in order for us  to release information regarding their care.    Consent: Patient/Guardian gives verbal consent for treatment and assignment of benefits for services provided during this visit. Patient/Guardian expressed understanding and agreed to proceed.          Assessment and plan: Counselor will continue to meet with patient to address treatment plan goals. Patient will continue to follow recommendations of providers and implement skills learned in session, practicing skills between sessions. Diagnosis:  PTSD    Suicidal/Homicidal: Nowithout intent/plan   Therapist Response: Assessed pt's current functioning and reviewed progress.. Assisted pt processing stressors, negative thoughts.  Assisted pt processing for the management of his stressors.   Participation Level: Active     Follow Up Instructions: I discussed the assessment and treatment plan with the  patient. The patient was provided an opportunity to ask questions and all were answered. The patient agreed with the plan and demonstrated an understanding of the instructions.   The patient was advised to call back or seek an in-person evaluation if the symptoms worsen or if the condition fails to improve as anticipated.   I provided 45 minutes of non-face-to-face time during this encounter.     Sheria Rosello S, LCAS  10/26/23

## 2023-11-02 ENCOUNTER — Ambulatory Visit (INDEPENDENT_AMBULATORY_CARE_PROVIDER_SITE_OTHER): Admitting: Licensed Clinical Social Worker

## 2023-11-02 ENCOUNTER — Encounter (HOSPITAL_COMMUNITY): Payer: Self-pay | Admitting: Licensed Clinical Social Worker

## 2023-11-02 DIAGNOSIS — F4310 Post-traumatic stress disorder, unspecified: Secondary | ICD-10-CM

## 2023-11-02 NOTE — Progress Notes (Signed)
 Virtual Visit via Video Note  I connected with [name] [place] on 11/02/23 at 10-10:45am by a video-enabled telemedicine application and verified that I am speaking with the correct person using two identifiers.   I discussed the limitations of evaluation and management by telemedicine and the availability of in person appointments. The patient expressed understanding and agreed to proceed.   LOCATION: Patient: Home Provider: Home Office   History of Present Illness: Pt was referred to therapy by the [facility] for PTSD.    Participation Level: Active     Type of Therapy: Virtual Individual therapy   Treatment Goal addressed: Meet with clinician weekly for therapy to monitor for progress towards goals and address any barriers to success; Reduce depression from average severity level of 6/10 down to a 4/10 in next 6 mos by engaging in 1-2 positive coping skills for daily as part of developing self-care routine; Reduce average anxiety level from 7/10 down to 5/10 in next 6 mos by utilizing 3-5 relaxation skills/grounding skills per day, such as mindful breathing, progressive muscle relaxation, positive visualizations   Progression towards Goal: Progressing   Interventions: CBT/Supportive/Psychoeducation   Summary: Patient presented for today's session on time and was alert, oriented x5, with no evidence or self-report of SI/HI or A/V H. Patient reported ongoing compliance with medication.  Clinician inquired about patient's current emotional ratings, as well as any significant changes in thoughts, feelings or behavior since previous session. Patient reported his emotional ratings 410 for depression, 3/10 for anxiety, 310 for anger/irritability. [name] asked open ended questions.  Pt reports on his maladaptive behaviors and thinking patterns over the past week, which interferes with resolution of his trauma symptoms 3/3 X within the past week. Pt reported 3 nightmare over the past week.[name]  and pt reviewed his trauma symptoms over the past week.  We discussed the impact his stressors have had on pt as well as how he copes with them. Pt reports on his health, family, va dr visits, and pain level, which remains chronic. [name] asked open ended questions. [name] and pt  focused on stressors pt is facing as he is also navigating all his health concerns. 'I Met with my VSO because it looks like I'm going to be losing some of my VA disability. [name] and pt continued discussion working on keeping his disability benefits [name] and pt discussed the [place] letter pt received about his benefits. Istill need a letter from you as my therapist. Again, cln and pr reviewed the information necessary for the letter.[name] used CBT to processed thoughts, feelings, about this new information.Aaron Aas   Collaboration of Care: Other: Continue to see providers at [facility]   Patient/Guardian was advised Release of Information must be obtained prior to any record release in order to collaborate their care with an outside provider. Patient/Guardian was advised if they have not alreay done so to contact the registration department to sign all necessary forms in order for us  to release information regarding their care.    Consent: Patient/Guardian gives verbal consent for treatment and assignment of benefits for services provided during this visit. Patient/Guardian expressed understanding and agreed to proceed.          Assessment and plan: Counselor will continue to meet with patient to address treatment plan goals. Patient will continue to follow recommendations of providers and implement skills learned in session, practicing skills between sessions. Diagnosis:  PTSD    Suicidal/Homicidal: Nowithout intent/plan   Therapist Response: Assessed pt's current functioning  and reviewed progress.. Assisted pt processing stressors, negative thoughts.  Assisted pt processing for the management of his stressors.   Participation Level: Active      Follow Up Instructions: I discussed the assessment and treatment plan with the patient. The patient was provided an opportunity to ask questions and all were answered. The patient agreed with the plan and demonstrated an understanding of the instructions.   The patient was advised to call back or seek an in-person evaluation if the symptoms worsen or if the condition fails to improve as anticipated.   I provided 45 minutes of non-face-to-face time during this encounter.     Arlena Marsan S, LCAS  11/02/23

## 2023-11-09 ENCOUNTER — Ambulatory Visit (HOSPITAL_COMMUNITY): Admitting: Licensed Clinical Social Worker

## 2023-11-09 DIAGNOSIS — F4310 Post-traumatic stress disorder, unspecified: Secondary | ICD-10-CM | POA: Diagnosis not present

## 2023-11-12 ENCOUNTER — Encounter (HOSPITAL_COMMUNITY): Payer: Self-pay | Admitting: Licensed Clinical Social Worker

## 2023-11-12 NOTE — Progress Notes (Signed)
 Virtual Visit via Video Note  I connected with [name]. Jackson [name] on 11/09/23 at 10-10:45am by a video-enabled telemedicine application and verified that I am speaking with the correct person using two identifiers.   I discussed the limitations of evaluation and management by telemedicine and the availability of in person appointments. The patient expressed understanding and agreed to proceed.   LOCATION: Patient: Home Provider: Home Office   History of Present Illness: Pt was referred to therapy by the [facility] for PTSD.    Participation Level: Active     Type of Therapy: Virtual Individual therapy   Treatment Goal addressed: Meet with clinician weekly for therapy to monitor for progress towards goals and address any barriers to success; Reduce depression from average severity level of 6/10 down to a 4/10 in next 6 mos by engaging in 1-2 positive coping skills for daily as part of developing self-care routine; Reduce average anxiety level from 7/10 down to 5/10 in next 6 mos by utilizing 3-5 relaxation skills/grounding skills per day, such as mindful breathing, progressive muscle relaxation, positive visualizations   Progression towards Goal: Progressing   Interventions: CBT/Supportive/Psychoeducation   Summary: Patient presented for today's session on time and was alert, oriented x5, with no evidence or self-report of SI/HI or A/V H. Patient reported ongoing compliance with medication.  Clinician inquired about patient's current emotional ratings, as well as any significant changes in thoughts, feelings or behavior since previous session. Patient reported his emotional ratings 410 for depression, 3/10 for anxiety, 310 for anger/irritability. [name] asked open ended questions.  Pt reports on his maladaptive behaviors and thinking patterns over the past week, which interferes with resolution of his trauma symptoms 3/3 X within the past week. Pt reported 3 nightmare over the past week.[name]  and pt reviewed his trauma symptoms over the past week.  We discussed the impact his stressors have had on pt as well as how he copes with them. Pt reports on his health, family, va dr visits, and pain level, which remains chronic. [name] asked open ended questions. [name] and pt  focused on stressors pt is facing as he is also navigating all his health concerns. Im not sleeping well, feel like I'm crawling out of my skin, shaky. I went to my PCP at the [place] and he prescribed Prazocin, Amitriptane and Cymbalta. [name] and pt reviewed the medications and cln answered questions about the meds. Again, discussed the benefit [employer] letter.    Jackson [name]   Collaboration of Care: Other: Continue to see providers at [facility]   Patient/Guardian was advised Release of Information must be obtained prior to any record release in order to collaborate their care with an outside provider. Patient/Guardian was advised if they have not alreay done so to contact the registration department to sign all necessary forms in order for us  to release information regarding their care.    Consent: Patient/Guardian gives verbal consent for treatment and assignment of benefits for services provided during this visit. Patient/Guardian expressed understanding and agreed to proceed.          Assessment and plan: Counselor will continue to meet with patient to address treatment plan goals. Patient will continue to follow recommendations of providers and implement skills learned in session, practicing skills between sessions. Diagnosis:  PTSD    Suicidal/Homicidal: Nowithout intent/plan   Therapist Response: Assessed pt's current functioning and reviewed progress.. Assisted pt processing stressors, negative thoughts.  Assisted pt processing for the management of his stressors.  Participation Level: Active     Follow Up Instructions: I discussed the assessment and treatment plan with the patient. The patient was provided an opportunity to ask questions  and all were answered. The patient agreed with the plan and demonstrated an understanding of the instructions.   The patient was advised to call back or seek an in-person evaluation if the symptoms worsen or if the condition fails to improve as anticipated.   I provided 45 minutes of non-face-to-face time during this encounter.     Jackson [name] S, LCAS  11/09/23

## 2023-11-16 ENCOUNTER — Ambulatory Visit (INDEPENDENT_AMBULATORY_CARE_PROVIDER_SITE_OTHER): Admitting: Licensed Clinical Social Worker

## 2023-11-16 ENCOUNTER — Encounter (HOSPITAL_COMMUNITY): Payer: Self-pay | Admitting: Licensed Clinical Social Worker

## 2023-11-16 DIAGNOSIS — F4310 Post-traumatic stress disorder, unspecified: Secondary | ICD-10-CM | POA: Diagnosis not present

## 2023-11-16 NOTE — Progress Notes (Signed)
 Virtual Visit via Video Note  I connected with [name] [place] on 11/16/23 at 4-4:45pm by a video-enabled telemedicine application and verified that I am speaking with the correct person using two identifiers.   I discussed the limitations of evaluation and management by telemedicine and the availability of in person appointments. The patient expressed understanding and agreed to proceed.   LOCATION: Patient: Home Provider: Home Office   History of Present Illness: Pt was referred to therapy by the [facility] for PTSD.    Participation Level: Active     Type of Therapy: Virtual Individual therapy   Treatment Goal addressed: Meet with clinician weekly for therapy to monitor for progress towards goals and address any barriers to success; Reduce depression from average severity level of 6/10 down to a 4/10 in next 6 mos by engaging in 1-2 positive coping skills for daily as part of developing self-care routine; Reduce average anxiety level from 7/10 down to 5/10 in next 6 mos by utilizing 3-5 relaxation skills/grounding skills per day, such as mindful breathing, progressive muscle relaxation, positive visualizations   Progression towards Goal: Progressing   Interventions: CBT/Supportive/Psychoeducation   Summary: Patient presented for today's session on time and was alert, oriented x5, with no evidence or self-report of SI/HI or A/V H. Patient reported ongoing compliance with medication.  Clinician inquired about patient's current emotional ratings, as well as any significant changes in thoughts, feelings or behavior since previous session. Patient reported his emotional ratings 410 for depression, 3/10 for anxiety, 310 for anger/irritability. [name] asked open ended questions.  Pt reports on his maladaptive behaviors and thinking patterns over the past week, which interferes with resolution of his trauma symptoms 3/3 X within the past week. Pt reported 3 nightmare over the past week.[name] and  pt reviewed his trauma symptoms over the past week.  We discussed the impact his stressors have had on pt as well as how he copes with them. Pt reports on his health, family, va dr visits, and pain level, which remains chronic. [name] asked open ended questions. [name] and pt  focused on stressors pt is facing as he is also navigating all his health concerns. I don't think these new meds are working: Prazocin, Amitriptane and Cymbalta. [name] suggested pt contact his provider to let them know the meds are not working. [name] and pt discussed upcoming  4th of July holiday which includes fireworks. [name] and pt discussed coping tools to use on the day. Pt called [facility] billing and asked them to rebill his bill to the [place] community care and not his medicare.  PLAN: discussed the benefits [employer] letter.    [name]   Collaboration of Care: Other: Continue to see providers at [facility]   Patient/Guardian was advised Release of Information must be obtained prior to any record release in order to collaborate their care with an outside provider. Patient/Guardian was advised if they have not alreay done so to contact the registration department to sign all necessary forms in order for us  to release information regarding their care.    Consent: Patient/Guardian gives verbal consent for treatment and assignment of benefits for services provided during this visit. Patient/Guardian expressed understanding and agreed to proceed.          Assessment and plan: Counselor will continue to meet with patient to address treatment plan goals. Patient will continue to follow recommendations of providers and implement skills learned in session, practicing skills between sessions. Diagnosis:  PTSD    Suicidal/Homicidal:  Nowithout intent/plan   Therapist Response: Assessed pt's current functioning and reviewed progress.. Assisted pt processing stressors, negative thoughts.  Assisted pt processing for the management of his stressors.   Participation  Level: Active     Follow Up Instructions: I discussed the assessment and treatment plan with the patient. The patient was provided an opportunity to ask questions and all were answered. The patient agreed with the plan and demonstrated an understanding of the instructions.   The patient was advised to call back or seek an in-person evaluation if the symptoms worsen or if the condition fails to improve as anticipated.   I provided 45 minutes of non-face-to-face time during this encounter.     Ashvik Grundman S, LCAS  11/16/23

## 2023-11-23 ENCOUNTER — Ambulatory Visit (INDEPENDENT_AMBULATORY_CARE_PROVIDER_SITE_OTHER): Admitting: Licensed Clinical Social Worker

## 2023-11-23 DIAGNOSIS — F4310 Post-traumatic stress disorder, unspecified: Secondary | ICD-10-CM

## 2023-11-24 ENCOUNTER — Encounter (HOSPITAL_COMMUNITY): Payer: Self-pay | Admitting: Licensed Clinical Social Worker

## 2023-11-24 NOTE — Progress Notes (Signed)
 Virtual Visit via Video Note  I connected with [name] Chelf on 11/23/23 at 10-10:45am by a video-enabled telemedicine application and verified that I am speaking with the correct person using two identifiers.   I discussed the limitations of evaluation and management by telemedicine and the availability of in person appointments. The patient expressed understanding and agreed to proceed.   LOCATION: Patient: Home Provider: Home Office   History of Present Illness: Pt was referred to therapy by the [facility] for PTSD.    Participation Level: Active     Type of Therapy: Virtual Individual therapy   Treatment Goal addressed: Meet with clinician weekly for therapy to monitor for progress towards goals and address any barriers to success; Reduce depression from average severity level of 6/10 down to a 4/10 in next 6 mos by engaging in 1-2 positive coping skills for daily as part of developing self-care routine; Reduce average anxiety level from 7/10 down to 5/10 in next 6 mos by utilizing 3-5 relaxation skills/grounding skills per day, such as mindful breathing, progressive muscle relaxation, positive visualizations   Progression towards Goal: Progressing   Interventions: CBT/Supportive/Psychoeducation   Summary: Patient presented for today's session on time and was alert, oriented x5, with no evidence or self-report of SI/HI or A/V H. Patient reported ongoing compliance with medication.  Clinician inquired about patient's current emotional ratings, as well as any significant changes in thoughts, feelings or behavior since previous session. Patient reported his emotional ratings 410 for depression, 3/10 for anxiety, 310 for anger/irritability. [name] asked open ended questions.  Pt reports on his maladaptive behaviors and thinking patterns over the past week, which interferes with resolution of his trauma symptoms 3/3 X within the past week. Pt reported 3 nightmare over the past week.[name] and  pt reviewed his trauma symptoms over the past week.  We discussed the impact his stressors have had on pt as well as how he copes with them. Pt reports on his health, family, va dr visits, and pain level, which remains chronic. [name] asked open ended questions. [name] and pt  focused on stressors pt is facing as he is also navigating all his health concerns. I continue to think these new meds are still not working: Prazocin, Amitriptane and Cymbalta. [name] suggested pt contact his provider to let them know the meds are not working. Im supposed to go back to the dr next week and then I'll tell her. [name] and pt discussed  4th of July holiday which included fireworks.I left the baseball field before the fireworks started.  [name] and pt discussed the effective coping skill: leaving the area where the stressor is taking place.   PLAN: discussed the benefits [place] letter.    [name]   Collaboration of Care: Other: Continue to see providers at [facility]   Patient/Guardian was advised Release of Information must be obtained prior to any record release in order to collaborate their care with an outside provider. Patient/Guardian was advised if they have not alreay done so to contact the registration department to sign all necessary forms in order for us  to release information regarding their care.    Consent: Patient/Guardian gives verbal consent for treatment and assignment of benefits for services provided during this visit. Patient/Guardian expressed understanding and agreed to proceed.          Assessment and plan: Counselor will continue to meet with patient to address treatment plan goals. Patient will continue to follow recommendations of providers and implement skills learned  in session, practicing skills between sessions. Diagnosis:  PTSD    Suicidal/Homicidal: Nowithout intent/plan   Therapist Response: Assessed pt's current functioning and reviewed progress.. Assisted pt processing stressors, negative thoughts.   Assisted pt processing for the management of his stressors.   Participation Level: Active     Follow Up Instructions: I discussed the assessment and treatment plan with the patient. The patient was provided an opportunity to ask questions and all were answered. The patient agreed with the plan and demonstrated an understanding of the instructions.   The patient was advised to call back or seek an in-person evaluation if the symptoms worsen or if the condition fails to improve as anticipated.   I provided 45 minutes of non-face-to-face time during this encounter.     Nyelah Emmerich S, LCAS  11/23/23

## 2023-11-30 ENCOUNTER — Encounter (HOSPITAL_COMMUNITY): Payer: Self-pay | Admitting: Licensed Clinical Social Worker

## 2023-11-30 ENCOUNTER — Ambulatory Visit (HOSPITAL_COMMUNITY): Admitting: Licensed Clinical Social Worker

## 2023-11-30 DIAGNOSIS — F4310 Post-traumatic stress disorder, unspecified: Secondary | ICD-10-CM

## 2023-11-30 NOTE — Progress Notes (Signed)
 Virtual Visit via Video Note  I connected with [name] [name] on 11/30/23 at 10-10:45am by a video-enabled telemedicine application and verified that I am speaking with the correct person using two identifiers.   I discussed the limitations of evaluation and management by telemedicine and the availability of in person appointments. The patient expressed understanding and agreed to proceed.   LOCATION: Patient: Home Provider: Home Office   History of Present Illness: Pt was referred to therapy by the [facility] for PTSD.    Participation Level: Active     Type of Therapy: Virtual Individual therapy   Treatment Goal addressed: Meet with clinician weekly for therapy to monitor for progress towards goals and address any barriers to success; Reduce depression from average severity level of 6/10 down to a 4/10 in next 6 mos by engaging in 1-2 positive coping skills for daily as part of developing self-care routine; Reduce average anxiety level from 7/10 down to 5/10 in next 6 mos by utilizing 3-5 relaxation skills/grounding skills per day, such as mindful breathing, progressive muscle relaxation, positive visualizations   Progression towards Goal: Progressing   Interventions: CBT/Supportive/Psychoeducation   Summary: Patient presented for today's session on time and was alert, oriented x5, with no evidence or self-report of SI/HI or A/V H. Patient reported ongoing compliance with medication.  Clinician inquired about patient's current emotional ratings, as well as any significant changes in thoughts, feelings or behavior since previous session. Patient reported his emotional ratings 410 for depression, 3/10 for anxiety, 310 for anger/irritability. [name] asked open ended questions.  Pt reports on his maladaptive behaviors and thinking patterns over the past week, which interferes with resolution of his trauma symptoms 3/3 X within the past week. Pt reported 3 nightmare over the past week.[name]  and pt reviewed his trauma symptoms over the past week.  We discussed the impact his stressors have had on pt as well as how he copes with them. Pt reports on his health, family, va dr visits, and pain level, which remains chronic. [name] asked open ended questions. [name] and pt  focused on stressors pt is facing as he is also navigating all his health concerns. I continue to think these new meds are still not working: Prazocin, Amitriptane and Cymbalta. [name] suggested pt contact his provider to let them know the meds are not working. I have my appt Wednesday to discuss my meds. .[name] and pt reviewed questions and suggestions to talk with for medical provider.    PLAN: discussed the benefits [place] letter.    [name]   Collaboration of Care: Other: Continue to see providers at [facility]   Patient/Guardian was advised Release of Information must be obtained prior to any record release in order to collaborate their care with an outside provider. Patient/Guardian was advised if they have not alreay done so to contact the registration department to sign all necessary forms in order for us  to release information regarding their care.    Consent: Patient/Guardian gives verbal consent for treatment and assignment of benefits for services provided during this visit. Patient/Guardian expressed understanding and agreed to proceed.          Assessment and plan: Counselor will continue to meet with patient to address treatment plan goals. Patient will continue to follow recommendations of providers and implement skills learned in session, practicing skills between sessions. Diagnosis:  PTSD    Suicidal/Homicidal: Nowithout intent/plan   Therapist Response: Assessed pt's current functioning and reviewed progress.. Assisted pt processing stressors,  negative thoughts.  Assisted pt processing for the management of his stressors.   Participation Level: Active     Follow Up Instructions: I discussed the assessment and  treatment plan with the patient. The patient was provided an opportunity to ask questions and all were answered. The patient agreed with the plan and demonstrated an understanding of the instructions.   The patient was advised to call back or seek an in-person evaluation if the symptoms worsen or if the condition fails to improve as anticipated.   I provided 45 minutes of non-face-to-face time during this encounter.     Jackson [name] S, LCAS  11/30/23

## 2023-12-07 ENCOUNTER — Ambulatory Visit (INDEPENDENT_AMBULATORY_CARE_PROVIDER_SITE_OTHER): Admitting: Licensed Clinical Social Worker

## 2023-12-07 DIAGNOSIS — F4310 Post-traumatic stress disorder, unspecified: Secondary | ICD-10-CM

## 2023-12-09 ENCOUNTER — Encounter (HOSPITAL_COMMUNITY): Payer: Self-pay | Admitting: Licensed Clinical Social Worker

## 2023-12-09 NOTE — Progress Notes (Signed)
 Virtual Visit via Video Note  I connected with [name] Sanguinetti on 12/07/23 at 10-10:45am by a video-enabled telemedicine application and verified that I am speaking with the correct person using two identifiers.   I discussed the limitations of evaluation and management by telemedicine and the availability of in person appointments. The patient expressed understanding and agreed to proceed.   LOCATION: Patient: Home Provider: Home Office   History of Present Illness: Pt was referred to therapy by the [facility] for PTSD.    Participation Level: Active     Type of Therapy: Virtual Individual therapy   Treatment Goal addressed: Meet with clinician weekly for therapy to monitor for progress towards goals and address any barriers to success; Reduce depression from average severity level of 6/10 down to a 4/10 in next 6 mos by engaging in 1-2 positive coping skills for daily as part of developing self-care routine; Reduce average anxiety level from 7/10 down to 5/10 in next 6 mos by utilizing 3-5 relaxation skills/grounding skills per day, such as mindful breathing, progressive muscle relaxation, positive visualizations   Progression towards Goal: Progressing   Interventions: CBT/Supportive/Psychoeducation   Summary: Patient presented for today's session on time and was alert, oriented x5, with no evidence or self-report of SI/HI or A/V H. Patient reported ongoing compliance with medication.  Clinician inquired about patient's current emotional ratings, as well as any significant changes in thoughts, feelings or behavior since previous session. Patient reported his emotional ratings 410 for depression, 3/10 for anxiety, 310 for anger/irritability. [name] asked open ended questions.  Pt reports on his maladaptive behaviors and thinking patterns over the past week, which interferes with resolution of his trauma symptoms 3/3 X within the past week. Pt reported 3 nightmare over the past week.[name]  and pt reviewed his trauma symptoms over the past week.  We discussed the impact his stressors have had on pt as well as how he copes with them. Pt reports on his health, family, va dr visits, and pain level, which remains chronic. [name] asked open ended questions. [name] and pt  focused on stressors pt is facing as he is also navigating all his health concerns. I continue to think these new meds are still not working: Prazocin, Amitriptane and Cymbalta. I see the medication management provider this week. I'm still having symptoms: not sleeping, groggy, shaking,, nightmares.  Clinician processed thoughts, feelings, and behaviors. Clinician discussed coping skills and options for supporting himself through these challenging times. [name]    PLAN: discussed the benefits VA letter.    [name]   Collaboration of Care: Other: Continue to see providers at [facility]   Patient/Guardian was advised Release of Information must be obtained prior to any record release in order to collaborate their care with an outside provider. Patient/Guardian was advised if they have not alreay done so to contact the registration department to sign all necessary forms in order for us  to release information regarding their care.    Consent: Patient/Guardian gives verbal consent for treatment and assignment of benefits for services provided during this visit. Patient/Guardian expressed understanding and agreed to proceed.          Assessment and plan: Counselor will continue to meet with patient to address treatment plan goals. Patient will continue to follow recommendations of providers and implement skills learned in session, practicing skills between sessions. Diagnosis:  PTSD    Suicidal/Homicidal: Nowithout intent/plan   Therapist Response: Assessed pt's current functioning and reviewed progress.. Assisted pt processing  stressors, negative thoughts.  Assisted pt processing for the management of his stressors.   Participation Level:  Active     Follow Up Instructions: I discussed the assessment and treatment plan with the patient. The patient was provided an opportunity to ask questions and all were answered. The patient agreed with the plan and demonstrated an understanding of the instructions.   The patient was advised to call back or seek an in-person evaluation if the symptoms worsen or if the condition fails to improve as anticipated.   I provided 45 minutes of non-face-to-face time during this encounter.     Leontyne Manville S, LCAS  12/07/23

## 2023-12-14 ENCOUNTER — Ambulatory Visit (HOSPITAL_COMMUNITY): Admitting: Licensed Clinical Social Worker

## 2023-12-14 DIAGNOSIS — F4310 Post-traumatic stress disorder, unspecified: Secondary | ICD-10-CM | POA: Diagnosis not present

## 2023-12-15 ENCOUNTER — Encounter (HOSPITAL_COMMUNITY): Payer: Self-pay | Admitting: Licensed Clinical Social Worker

## 2023-12-15 NOTE — Progress Notes (Signed)
 Virtual Visit via Video Note  I connected with Jackson [name]. [name] on 12/14/23 at 4--4:45pm by a video-enabled telemedicine application and verified that I am speaking with the correct person using two identifiers.   I discussed the limitations of evaluation and management by telemedicine and the availability of in person appointments. The patient expressed understanding and agreed to proceed.   LOCATION: Patient: Home Provider: Home Office   History of Present Illness: Pt was referred to therapy by the [name] for PTSD.    Participation Level: Active     Type of Therapy: Virtual Individual therapy   Treatment Goal addressed: Meet with clinician weekly for therapy to monitor for progress towards goals and address any barriers to success; Reduce depression from average severity level of 6/10 down to a 4/10 in next 6 mos by engaging in 1-2 positive coping skills for daily as part of developing self-care routine; Reduce average anxiety level from 7/10 down to 5/10 in next 6 mos by utilizing 3-5 relaxation skills/grounding skills per day, such as mindful breathing, progressive muscle relaxation, positive visualizations   Progression towards Goal: Progressing   Interventions: CBT/Supportive/Psychoeducation   Summary: Patient presented for today'[name] session on time and was alert, oriented x5, with no evidence or self-report of SI/HI or A/V H. Patient reported ongoing compliance with medication.  Clinician inquired about patient'[name] current emotional ratings, as well as any significant changes in thoughts, feelings or behavior since previous session. Patient reported his emotional ratings 410 for depression, 3/10 for anxiety, 310 for anger/irritability. [name] asked open ended questions.  Pt reports on his maladaptive behaviors and thinking patterns over the past week, which interferes with resolution of his trauma symptoms 3/3 X within the past week. Pt reported 3 nightmare over the past week.[name] and  pt reviewed his trauma symptoms over the past week.  We discussed the impact his stressors have had on pt as well as how he copes with them. Pt reports on his health, family, va dr visits, and pain level, which remains chronic. [name] asked open ended questions. [name] and pt  focused on stressors pt is facing as he is also navigating all his health concerns. I continue to These new meds are still not working: Prazocin, cyprohepatidine and Cymbalta. I saw the medication management provider last week. I'm still having symptoms: not sleeping, groggy, shaking,, nightmares.  Clinician processed thoughts, feelings, and behaviors. Clinician discussed coping skills and options for supporting himself through these challenging times. [name]       Collaboration of Care: Other: Continue to see providers at [name]   Patient/Guardian was advised Release of Information must be obtained prior to any record release in order to collaborate their care with an outside provider. Patient/Guardian was advised if they have not alreay done so to contact the registration department to sign all necessary forms in order for us  to release information regarding their care.    Consent: Patient/Guardian gives verbal consent for treatment and assignment of benefits for services provided during this visit. Patient/Guardian expressed understanding and agreed to proceed.          Assessment and plan: Counselor will continue to meet with patient to address treatment plan goals. Patient will continue to follow recommendations of providers and implement skills learned in session, practicing skills between sessions. Diagnosis:  PTSD    Suicidal/Homicidal: Nowithout intent/plan   Therapist Response: Assessed pt'[name] current functioning and reviewed progress.. Assisted pt processing stressors, negative thoughts.  Assisted pt processing for the management  of his stressors.   Participation Level: Active     Follow Up Instructions: I discussed  the assessment and treatment plan with the patient. The patient was provided an opportunity to ask questions and all were answered. The patient agreed with the plan and demonstrated an understanding of the instructions.   The patient was advised to call back or seek an in-person evaluation if the symptoms worsen or if the condition fails to improve as anticipated.   I provided 45 minutes of non-face-to-face time during this encounter.     Jackson [name], LCAS  12/14/23

## 2023-12-21 ENCOUNTER — Ambulatory Visit (HOSPITAL_COMMUNITY): Admitting: Licensed Clinical Social Worker

## 2023-12-21 ENCOUNTER — Encounter (HOSPITAL_COMMUNITY): Payer: Self-pay | Admitting: Licensed Clinical Social Worker

## 2023-12-21 DIAGNOSIS — F4310 Post-traumatic stress disorder, unspecified: Secondary | ICD-10-CM | POA: Diagnosis not present

## 2023-12-21 NOTE — Progress Notes (Signed)
 Virtual Visit via Video Note  I connected with Jackson [name]. [place]ack on 12/14/23 at 10-10:45am by a video-enabled telemedicine application and verified that I am speaking with the correct person using two identifiers.   I discussed the limitations of evaluation and management by telemedicine and the availability of in person appointments. The patient expressed understanding and agreed to proceed.   LOCATION: Patient: Home Provider: Home Office   History of Present Illness: Pt was referred to therapy by the [facility] for PTSD.    Participation Level: Active     Type of Therapy: Virtual Individual therapy   Treatment Goal addressed: Meet with clinician weekly for therapy to monitor for progress towards goals and address any barriers to success; Reduce depression from average severity level of 6/10 down to a 4/10 in next 6 mos by engaging in 1-2 positive coping skills for daily as part of developing self-care routine; Reduce average anxiety level from 7/10 down to 5/10 in next 6 mos by utilizing 3-5 relaxation skills/grounding skills per day, such as mindful breathing, progressive muscle relaxation, positive visualizations   Progression towards Goal: Progressing   Interventions: CBT/Supportive/Psychoeducation   Summary: Patient presented for today's session on time and was alert, oriented x5, with no evidence or self-report of SI/HI or A/V H. Patient reported ongoing compliance with medication.  Clinician inquired about patient's current emotional ratings, as well as any significant changes in thoughts, feelings or behavior since previous session. Patient reported his emotional ratings 410 for depression, 3/10 for anxiety, 310 for anger/irritability. [name] asked open ended questions.  Pt reports on his maladaptive behaviors and thinking patterns over the past week, which interferes with resolution of his trauma symptoms 3/3 X within the past week. Pt reported 3 nightmare over the past week.[name]  and pt reviewed his trauma symptoms over the past week.  We discussed the impact his stressors have had on pt as well as how he copes with them. Pt reports on his health, family, va dr visits, and pain level, which remains chronic. [name] asked open ended questions. [name] and pt  focused on stressors pt is facing as he is also navigating all his health concerns. I continue to These new meds are still not working: Prazocin, cyprohepatidine and Cymbalta.  [name] suggested pt reach out to provider to discuss his meds.Clinician utilized MI OARS to reflect and summarize thoughts and feelings.  [name]       Collaboration of Care: Other: Continue to see providers at [facility]   Patient/Guardian was advised Release of Information must be obtained prior to any record release in order to collaborate their care with an outside provider. Patient/Guardian was advised if they have not alreay done so to contact the registration department to sign all necessary forms in order for us  to release information regarding their care.    Consent: Patient/Guardian gives verbal consent for treatment and assignment of benefits for services provided during this visit. Patient/Guardian expressed understanding and agreed to proceed.          Assessment and plan: Counselor will continue to meet with patient to address treatment plan goals. Patient will continue to follow recommendations of providers and implement skills learned in session, practicing skills between sessions. Diagnosis:  PTSD    Suicidal/Homicidal: Nowithout intent/plan   Therapist Response: Assessed pt's current functioning and reviewed progress.. Assisted pt processing stressors, negative thoughts.  Assisted pt processing for the management of his stressors.   Participation Level: Active     Follow Up  Instructions: I discussed the assessment and treatment plan with the patient. The patient was provided an opportunity to ask questions and all were answered. The patient  agreed with the plan and demonstrated an understanding of the instructions.   The patient was advised to call back or seek an in-person evaluation if the symptoms worsen or if the condition fails to improve as anticipated.   I provided 45 minutes of non-face-to-face time during this encounter.     Jackson [name] S, LCAS  12/21/23

## 2023-12-28 ENCOUNTER — Ambulatory Visit (HOSPITAL_COMMUNITY): Admitting: Licensed Clinical Social Worker

## 2023-12-28 DIAGNOSIS — F4310 Post-traumatic stress disorder, unspecified: Secondary | ICD-10-CM

## 2023-12-29 ENCOUNTER — Encounter (HOSPITAL_COMMUNITY): Payer: Self-pay | Admitting: Licensed Clinical Social Worker

## 2023-12-29 NOTE — Progress Notes (Signed)
 Virtual Visit via Video Note  I connected with [name] Gaiser on 12/28/23 at 10-10:45am by a video-enabled telemedicine application and verified that I am speaking with the correct person using two identifiers.   I discussed the limitations of evaluation and management by telemedicine and the availability of in person appointments. The patient expressed understanding and agreed to proceed.   LOCATION: Patient: Home Provider: Home Office   History of Present Illness: Pt was referred to therapy by the [facility] for PTSD.    Participation Level: Active     Type of Therapy: Virtual Individual therapy   Treatment Goal addressed: Meet with clinician weekly for therapy to monitor for progress towards goals and address any barriers to success; Reduce depression from average severity level of 6/10 down to a 4/10 in next 6 mos by engaging in 1-2 positive coping skills for daily as part of developing self-care routine; Reduce average anxiety level from 7/10 down to 5/10 in next 6 mos by utilizing 3-5 relaxation skills/grounding skills per day, such as mindful breathing, progressive muscle relaxation, positive visualizations   Progression towards Goal: Progressing   Interventions: CBT/Supportive/Psychoeducation   Summary: Patient presented for today's session on time and was alert, oriented x5, with no evidence or self-report of SI/HI or A/V H. Patient reported ongoing compliance with medication.  Clinician inquired about patient's current emotional ratings, as well as any significant changes in thoughts, feelings or behavior since previous session. Patient reported his emotional ratings 410 for depression, 3/10 for anxiety, 310 for anger/irritability. [name] asked open ended questions.  Pt reports on his maladaptive behaviors and thinking patterns over the past week, which interferes with resolution of his trauma symptoms 3/3 X within the past week. Pt reported 3 nightmare over the past week.[name]  and pt reviewed his trauma symptoms over the past week.  We discussed the impact his stressors have had on pt as well as how he copes with them. Pt reports on his health, family, va dr visits, and pain level, which remains chronic. [name] asked open ended questions. [name] and pt  focused on stressors pt is facing as he is also navigating all his health concerns. I continue to These new meds are still not working: Prazocin, cyprohepatidine and Cymbalta.  Again, cln suggested pt reach out to provider to discuss his meds. . Clinician used CBT to assist pt in processing thoughts, feelings, and behaviors      Collaboration of Care: Other: Continue to see providers at [facility]   Patient/Guardian was advised Release of Information must be obtained prior to any record release in order to collaborate their care with an outside provider. Patient/Guardian was advised if they have not alreay done so to contact the registration department to sign all necessary forms in order for us  to release information regarding their care.    Consent: Patient/Guardian gives verbal consent for treatment and assignment of benefits for services provided during this visit. Patient/Guardian expressed understanding and agreed to proceed.          Assessment and plan: Counselor will continue to meet with patient to address treatment plan goals. Patient will continue to follow recommendations of providers and implement skills learned in session, practicing skills between sessions. Diagnosis:  PTSD    Suicidal/Homicidal: Nowithout intent/plan   Therapist Response: Assessed pt's current functioning and reviewed progress.. Assisted pt processing stressors, negative thoughts.  Assisted pt processing for the management of his stressors.   Participation Level: Active     Follow  Up Instructions: I discussed the assessment and treatment plan with the patient. The patient was provided an opportunity to ask questions and all were answered. The  patient agreed with the plan and demonstrated an understanding of the instructions.   The patient was advised to call back or seek an in-person evaluation if the symptoms worsen or if the condition fails to improve as anticipated.   I provided 45 minutes of non-face-to-face time during this encounter.     Tima Curet S, LCAS  12/28/23

## 2024-01-04 ENCOUNTER — Ambulatory Visit (INDEPENDENT_AMBULATORY_CARE_PROVIDER_SITE_OTHER): Admitting: Licensed Clinical Social Worker

## 2024-01-04 ENCOUNTER — Ambulatory Visit (HOSPITAL_COMMUNITY): Admitting: Licensed Clinical Social Worker

## 2024-01-04 DIAGNOSIS — F4310 Post-traumatic stress disorder, unspecified: Secondary | ICD-10-CM

## 2024-01-06 ENCOUNTER — Ambulatory Visit (INDEPENDENT_AMBULATORY_CARE_PROVIDER_SITE_OTHER): Admitting: Licensed Clinical Social Worker

## 2024-01-06 DIAGNOSIS — F4310 Post-traumatic stress disorder, unspecified: Secondary | ICD-10-CM

## 2024-01-08 ENCOUNTER — Encounter (HOSPITAL_COMMUNITY): Payer: Self-pay | Admitting: Licensed Clinical Social Worker

## 2024-01-08 NOTE — Progress Notes (Signed)
 Virtual Visit via Video Note  I connected with [name] on 01/04/24 at 3-3:45 pm by a video-enabled telemedicine application and verified that I am speaking with the correct person using two identifiers.   I discussed the limitations of evaluation and management by telemedicine and the availability of in person appointments. The patient expressed understanding and agreed to proceed.   LOCATION: Patient: Home Provider: Home Office   History of Present Illness: Pt was referred to therapy by the [facility] for PTSD.    Participation Level: Active     Type of Therapy: Virtual Individual therapy   Treatment Goal addressed: Meet with clinician weekly for therapy to monitor for progress towards goals and address any barriers to success; Reduce depression from average severity level of 6/10 down to a 4/10 in next 6 mos by engaging in 1-2 positive coping skills for daily as part of developing self-care routine; Reduce average anxiety level from 7/10 down to 5/10 in next 6 mos by utilizing 3-5 relaxation skills/grounding skills per day, such as mindful breathing, progressive muscle relaxation, positive visualizations   Progression towards Goal: Progressing   Interventions: CBT/Supportive/Psychoeducation   Summary: Patient presented for today's session on time and was alert, oriented x5, with no evidence or self-report of SI/HI or A/V H. Patient reported ongoing compliance with medication.  Clinician inquired about patient's current emotional ratings, as well as any significant changes in thoughts, feelings or behavior since previous session. Patient reported his emotional ratings 410 for depression, 3/10 for anxiety, 310 for anger/irritability. [name] asked open ended questions.  Pt reports on his maladaptive behaviors and thinking patterns over the past week, which interferes with resolution of his trauma symptoms 3/3 X within the past week. Pt reported 3 nightmare over the past week.[name] and  pt reviewed his trauma symptoms over the past week.  We discussed the impact his stressors have had on pt as well as how he copes with them. Pt reports on his health, family, va dr visits, and pain level, which remains chronic. [name] asked open ended questions. [name] and pt  focused on stressors pt is facing as he is also navigating all his health concerns.  I met with back specialist this morning for an injection and it didn't work. I have another appt in 2 weeks. My new meds are still not working. I have an appt Monday with my new PCP at the [facility]. [name] asked open ended questions. [name] used CBT for pain relief.         Collaboration of Care: Other: Continue to see providers at [facility]   Patient/Guardian was advised Release of Information must be obtained prior to any record release in order to collaborate their care with an outside provider. Patient/Guardian was advised if they have not alreay done so to contact the registration department to sign all necessary forms in order for us  to release information regarding their care.    Consent: Patient/Guardian gives verbal consent for treatment and assignment of benefits for services provided during this visit. Patient/Guardian expressed understanding and agreed to proceed.          Assessment and plan: Counselor will continue to meet with patient to address treatment plan goals. Patient will continue to follow recommendations of providers and implement skills learned in session, practicing skills between sessions. Diagnosis:  PTSD    Suicidal/Homicidal: Nowithout intent/plan   Therapist Response: Assessed pt's current functioning and reviewed progress.. Assisted pt processing stressors, negative thoughts.  Assisted pt processing  for the management of his stressors.   Participation Level: Active     Follow Up Instructions: I discussed the assessment and treatment plan with the patient. The patient was provided an opportunity to ask questions and all were  answered. The patient agreed with the plan and demonstrated an understanding of the instructions.   The patient was advised to call back or seek an in-person evaluation if the symptoms worsen or if the condition fails to improve as anticipated.   I provided 45 minutes of non-face-to-face time during this encounter.     Rosia Syme S, LCAS  01/04/24

## 2024-01-10 ENCOUNTER — Encounter (HOSPITAL_COMMUNITY): Payer: Self-pay | Admitting: Licensed Clinical Social Worker

## 2024-01-10 NOTE — Progress Notes (Signed)
 Virtual Visit via Video Note  I connected with [name] Tweedy on 01/06/24 at 3-3:30 pm by a video-enabled telemedicine application and verified that I am speaking with the correct person using two identifiers.   I discussed the limitations of evaluation and management by telemedicine and the availability of in person appointments. The patient expressed understanding and agreed to proceed.   LOCATION: Patient: Home Provider: Home Office   History of Present Illness: Pt was referred to therapy by the [facility] for PTSD.    Participation Level: Active     Type of Therapy: Virtual Individual therapy   Treatment Goal addressed: Meet with clinician weekly for therapy to monitor for progress towards goals and address any barriers to success; Reduce depression from average severity level of 6/10 down to a 4/10 in next 6 mos by engaging in 1-2 positive coping skills for daily as part of developing self-care routine; Reduce average anxiety level from 7/10 down to 5/10 in next 6 mos by utilizing 3-5 relaxation skills/grounding skills per day, such as mindful breathing, progressive muscle relaxation, positive visualizations   Progression towards Goal: Progressing   Interventions: CBT/Supportive/Psychoeducation   Summary: Patient presented for today's session on time and was alert, oriented x5, with no evidence or self-report of SI/HI or A/V H. Patient reported ongoing compliance with medication.  Clinician inquired about patient's current emotional ratings, as well as any significant changes in thoughts, feelings or behavior since previous session. Patient reported his emotional ratings 410 for depression, 3/10 for anxiety, 310 for anger/irritability. [name] asked open ended questions.  Pt reports on his maladaptive behaviors and thinking patterns over the past week, which interferes with resolution of his trauma symptoms 3/3 X within the past week. Pt reported 3 nightmare over the past week.[name] and  pt reviewed his trauma symptoms over the past week.  We discussed the impact his stressors have had on pt as well as how he copes with them. Pt reports on his health, family, va dr visits, and pain level, which remains chronic. [name] asked open ended questions. [name] and pt  focused on stressors pt is facing as he is also navigating all his health concerns.  I met with back specialist this morning for an injection and it didn't work. I have another appt in 2 weeks. [name] asked open ended questions. Again, cln used CBT for pain relief.         Collaboration of Care: Other: Continue to see providers at [facility]   Patient/Guardian was advised Release of Information must be obtained prior to any record release in order to collaborate their care with an outside provider. Patient/Guardian was advised if they have not alreay done so to contact the registration department to sign all necessary forms in order for us  to release information regarding their care.    Consent: Patient/Guardian gives verbal consent for treatment and assignment of benefits for services provided during this visit. Patient/Guardian expressed understanding and agreed to proceed.          Assessment and plan: Counselor will continue to meet with patient to address treatment plan goals. Patient will continue to follow recommendations of providers and implement skills learned in session, practicing skills between sessions. Diagnosis:  PTSD    Suicidal/Homicidal: Nowithout intent/plan   Therapist Response: Assessed pt's current functioning and reviewed progress.. Assisted pt processing stressors, negative thoughts.  Assisted pt processing for the management of his stressors.   Participation Level: Active     Follow Up Instructions:  I discussed the assessment and treatment plan with the patient. The patient was provided an opportunity to ask questions and all were answered. The patient agreed with the plan and demonstrated an understanding  of the instructions.   The patient was advised to call back or seek an in-person evaluation if the symptoms worsen or if the condition fails to improve as anticipated.   I provided 30 minutes of non-face-to-face time during this encounter.     Sharie Amorin S, LCAS  01/06/24

## 2024-01-11 ENCOUNTER — Ambulatory Visit (HOSPITAL_COMMUNITY): Admitting: Licensed Clinical Social Worker

## 2024-01-11 DIAGNOSIS — F4310 Post-traumatic stress disorder, unspecified: Secondary | ICD-10-CM

## 2024-01-11 NOTE — Addendum Note (Signed)
 Addended by: Amarilys Lyles S on: 01/11/2024 04:32 PM   Modules accepted: Level of Service

## 2024-01-13 ENCOUNTER — Encounter (HOSPITAL_COMMUNITY): Payer: Self-pay | Admitting: Licensed Clinical Social Worker

## 2024-01-13 NOTE — Progress Notes (Signed)
 Virtual Visit via Video Note  I connected with [name]er on 01/11/24 at 4-4:45 pm by a video-enabled telemedicine application and verified that I am speaking with the correct person using two identifiers.   I discussed the limitations of evaluation and management by telemedicine and the availability of in person appointments. The patient expressed understanding and agreed to proceed.   LOCATION: Patient: Home Provider: Home Office   History of Present Illness: Pt was referred to therapy by the [facility] for PTSD.    Participation Level: Active     Type of Therapy: Virtual Individual therapy   Treatment Goal addressed: Meet with clinician weekly for therapy to monitor for progress towards goals and address any barriers to success; Reduce depression from average severity level of 6/10 down to a 4/10 in next 6 mos by engaging in 1-2 positive coping skills for daily as part of developing self-care routine; Reduce average anxiety level from 7/10 down to 5/10 in next 6 mos by utilizing 3-5 relaxation skills/grounding skills per day, such as mindful breathing, progressive muscle relaxation, positive visualizations   Progression towards Goal: Progressing   Interventions: CBT/Supportive/Psychoeducation   Summary: Patient presented for today's session on time and was alert, oriented x5, with no evidence or self-report of SI/HI or A/V H. Patient reported ongoing compliance with medication.  Clinician inquired about patient's current emotional ratings, as well as any significant changes in thoughts, feelings or behavior since previous session. Patient reported his emotional ratings 610 for depression, 5/10 for anxiety, 310 for anger/irritability. [name] asked open ended questions.  Pt reports on his maladaptive behaviors and thinking patterns over the past week, which interferes with resolution of his trauma symptoms 3/3 X within the past week. Pt reported 3 nightmare over the past week.[name] and  pt reviewed his trauma symptoms over the past week.  We discussed the impact his stressors have had on pt as well as how he copes with them. Pt reports on his health, family, va dr visits, and pain level, which remains chronic. [name] asked open ended questions. [name] and pt  focused on stressors pt is facing as he is also navigating all his health concerns.  I met with back specialist this morning and talked about next steps for my back. work. I have another appt in 2 weeks. [name] asked open ended questions. [name] continuing to use CBT for pain relief.      Collaboration of Care: Other: Continue to see providers at [facility]   Patient/Guardian was advised Release of Information must be obtained prior to any record release in order to collaborate their care with an outside provider. Patient/Guardian was advised if they have not alreay done so to contact the registration department to sign all necessary forms in order for us  to release information regarding their care.    Consent: Patient/Guardian gives verbal consent for treatment and assignment of benefits for services provided during this visit. Patient/Guardian expressed understanding and agreed to proceed.          Assessment and plan: Counselor will continue to meet with patient to address treatment plan goals. Patient will continue to follow recommendations of providers and implement skills learned in session, practicing skills between sessions. Diagnosis:  PTSD    Suicidal/Homicidal: Nowithout intent/plan   Therapist Response: Assessed pt's current functioning and reviewed progress.. Assisted pt processing stressors, negative thoughts.  Assisted pt processing for the management of his stressors.   Participation Level: Active     Follow Up Instructions:  I discussed the assessment and treatment plan with the patient. The patient was provided an opportunity to ask questions and all were answered. The patient agreed with the plan and demonstrated an  understanding of the instructions.   The patient was advised to call back or seek an in-person evaluation if the symptoms worsen or if the condition fails to improve as anticipated.   I provided 60 minutes of non-face-to-face time during this encounter.     Shann Lewellyn S, LCAS  01/13/24

## 2024-01-19 NOTE — ED Notes (Signed)
 Call on call urulogy DR HUDSON  TRANSFER THE CALL TO DR.AUTRY

## 2024-01-19 NOTE — ED Provider Notes (Signed)
 [facility]  ED Provider Note  Jackson [name] 78 y.o. male DOB: 1946/05/06 MRN: [id number] History   Chief Complaint  Patient presents with  . Urinary Retention    [facility] and inability to urinate since Sunday    78 year old male presenting to us  for urinary retention.  Patient states for the last few days he has been unable to urinate more than a few drops at a time.  He went to the [facility] who sent him to us  and discussed that he may require circumcision.  Patient states he has been unable to retract his foreskin for over a month and whenever he tries to urinate he has significant burning.  No fevers or chills, no abdominal pain.  Has never had urinary retention like this before.  No other acute complaints.   Urinary Retention Presenting symptoms: dysuria        Past Medical History:  Diagnosis Date  . Anemia   . Depression   . Diabetes mellitus (*)   . Hypertension   . Kidney stone   . Migraine   . PTSD (post-traumatic stress disorder)   . Sleep apnea   . UTI (urinary tract infection)     History reviewed. No pertinent surgical history.  Social History   Substance and Sexual Activity  Alcohol Use None   Tobacco Use History[1] E-Cigarettes  . Vaping Use    . Start Date    . Cartridges/Day    . Quit Date     Social History   Substance and Sexual Activity  Drug Use Not on file         Allergies[2]  Discharge Medication List as of 01/19/2024 11:52 AM      Primary Survey  Primary Survey  Review of Systems   Review of Systems  Constitutional: Negative.   HENT: Negative.    Respiratory: Negative.    Cardiovascular: Negative.   Gastrointestinal: Negative.   Genitourinary:  Positive for difficulty urinating and dysuria.  Musculoskeletal: Negative.     Physical Exam   ED Triage Vitals [01/19/24 0946]  BP (!) 160/70  Heart Rate 85  Resp 18  SpO2 97 %  Temp 98.5 F (36.9 C)     Physical Exam  Constitutional: He appears well-developed and well-nourished. He does not appear distressed and does not appear ill.  HENT:  Head: Normocephalic and atraumatic.  Eyes: EOM are intact.  Neck: Normal range of motion.  Cardiovascular: Normal rate, regular rhythm and normal heart sounds.  Pulmonary/Chest: Respiratory effort normal and breath sounds normal.  Abdominal: Soft. There is no abdominal tenderness. There is no guarding. Abdomen not distended.  Genitourinary:    Genitourinary Comments: Unable to fully retract foreskin to visualize the glans, foreskin is without erythema or edema, trace amount of dark urine visible at the opening of the foreskin, no significant testicular swelling or erythema noted   Musculoskeletal: Normal range of motion.     Cervical back: Normal range of motion.   Neurological: He is alert and oriented to person, place, and time.  Skin: Skin is warm. Skin is dry.     ED Course   Lab results:   URINALYSIS W/MICRO REFLEX CULTURE - SYMPTOMATIC - Abnormal      Result Value   Urine Color Orange (*)    Urine Clarity Turbid (*)    Urine Specific Gravity 1.034 (*)    Urine pH 8.5     Urine Protein -  Dipstick 300 (*)    Urine Glucose Negative     Urine Ketones Negative     Urine Bilirubin Negative     Urine Blood 1.0 (*)    Urine Nitrite Positive (*)    Urine Urobilinogen <2     Urine Leukocyte Esterase 500 (*)    Urine Squamous Epithelial Cells 0-2     Urine WBC >50 (*)    Urine RBC 75-100 (*)    Urine Bacteria 4+ (*)    UA Microscopic Yes Micro (*)   CULTURE, URINE    Imaging: No data to display   ECG: ECG Results   None                                                                        Pre-Sedation Procedures  ED Course as of 01/19/24 1926  Aloha Lamer Autry's Documentation  Tue Jan 19, 2024  [id number] 78 year old male presenting to us  for urinary retention.  H&P as above,  nontoxic-appearing male with unremarkable vital signs.  I could not fully retract his foreskin without assistance at this time, patient states this has been going on for at least a month.  Urinalysis obtained prior to my evaluation which shows significant infectious markers.  Will attempt to place Foley for presumed urinary retention and treat the patient with a dose of Rocephin department and outpatient antibiotics.  1151 Discussed the case with Dr. Junette, urologist on-call.  Patient's bladder scan revealed only 95 mL of urine so it seems he is not having significant retention at this time just inability to retract his foreskin.  Speaking with Dr. Junette, agree that outpatient follow-up in the nonemergent setting is the best path moving forward and we will give the patient a week of antibiotics based on his urine.  This was discussed with patient, he was given strict return precautions and follow-up with urology to which he expressed understanding prior to discharge.   Medical Decision Making Amount and/or Complexity of Data Reviewed Labs: ordered.  Risk Prescription drug management.          Provider Communication  Discharge Medication List as of 01/19/2024 11:52 AM     START taking these medications   Details  cefpodoxime (VANTIN) 200 mg tablet Take one tablet (200 mg dose) by mouth 2 (two) times daily for 7 days., Starting Tue 01/19/2024, Until Tue 01/26/2024, Print        Discharge Medication List as of 01/19/2024 11:52 AM      Discharge Medication List as of 01/19/2024 11:52 AM      Clinical Impression Final diagnoses:  Acute UTI  Foreskin does not retract    ED Disposition     ED Disposition  Discharge   Condition  Stable   Comment  --                 Follow-up Information     Schedule an appointment as soon as possible for a visit  with Norleen CHRISTELLA Blush, MD.   Specialties: Urology, Urological Surgery  Contact information: [address] [place] [address] [phone]                  Electronically signed by:       [  1] Social History Tobacco Use  Smoking Status Not on file  Smokeless Tobacco Not on file  [2] Allergies Allergen Reactions  . Sulfamethoxazole -Trimethoprim  Itching  . Allopurinol Nausea And Vomiting  . Penicillins Rash and Unknown    Reaction: nervousness Has patient had a PCN reaction causing immediate rash, facial/tongue/throat swelling, SOB or lightheadedness with hypotension: Yes Has patient had a PCN reaction causing severe rash involving mucus membranes or skin necrosis: No Has patient had a PCN reaction that required hospitalization: No Has patient had a PCN reaction occurring within the last 10 years: No If all of the above answers are NO, then may proceed with Cephalosporin use.   . Pregabalin Dizziness   Aloha Theo Furth, MD 01/19/24 [phone]

## 2024-01-20 ENCOUNTER — Other Ambulatory Visit: Payer: Self-pay

## 2024-01-20 ENCOUNTER — Emergency Department
Admission: EM | Admit: 2024-01-20 | Discharge: 2024-01-20 | Disposition: A | Discharge status: Home / Self Care | Attending: Emergency Medicine | Admitting: Emergency Medicine

## 2024-01-20 DIAGNOSIS — R339 Retention of urine, unspecified: Secondary | ICD-10-CM | POA: Insufficient documentation

## 2024-01-20 DIAGNOSIS — N471 Phimosis: Secondary | ICD-10-CM | POA: Diagnosis not present

## 2024-01-20 NOTE — ED Notes (Signed)
 Urology at bedside.

## 2024-01-20 NOTE — ED Notes (Signed)
Patient verbalizes understanding of discharge instructions. Opportunity for questioning and answers were provided. Armband removed by staff, pt discharged from ED. Wheeled out to car  

## 2024-01-20 NOTE — ED Provider Notes (Signed)
 [facility] Provider Note    Event Date/Time   First MD Initiated Contact with Patient 01/20/24 [phone]     (approximate)   History   Urinary Retention   HPI  Jackson [name] is a 78 y.o. male who presents with urinary retention.  The patient states that he has been unable to retract his foreskin for some time, and now is having difficulty urinating.  He states that a small amount of urine dribbles out but he is not able to initiate urination.  He denies any abdominal or suprapubic pain.  He denies any fever or chills.  He was seen at an outside facility and started on an antibiotic for UTI yesterday.  I reviewed the past medical records.  The patient was seen at [facility] in [place] yesterday for this symptom.  His foreskin could not be retracted at that time, however his bladder scan showed only 95 mL of urine, so urology consult recommended no Foley at that time.  He is urinalysis did show signs of infection so he was prescribed antibiotics.   Physical Exam   Triage Vital Signs: ED Triage Vitals [01/20/24 0845]  Encounter Vitals Group     BP (!) 179/124     Girls Systolic BP Percentile      Girls Diastolic BP Percentile      Boys Systolic BP Percentile      Boys Diastolic BP Percentile      Pulse Rate (!) 106     Resp (!) 24     Temp 97.8 F (36.6 C)     Temp Source Oral     SpO2 100 %     Weight      Height      Head Circumference      Peak Flow      Pain Score 9     Pain Loc      Pain Education      Exclude from Growth Chart     Most recent vital signs: Vitals:   01/20/24 1300 01/20/24 1330  BP: (!) 168/80 (!) 141/59  Pulse: 72 68  Resp:  20  Temp:    SpO2: 100% 100%    General: Awake, no distress.  CV:  Good peripheral perfusion.  Resp:  Normal effort.  Abd:  Soft and nontender.  No distention.  Other:  Phimosis.   ED Results / Procedures / Treatments   Labs (all labs ordered are listed, but only abnormal results  are displayed) Labs Reviewed - No data to display   EKG    RADIOLOGY    PROCEDURES:  Critical Care performed: No  Procedures   MEDICATIONS ORDERED IN ED: Medications - No data to display   IMPRESSION / MDM / ASSESSMENT AND PLAN / ED COURSE  I reviewed the triage vital signs and the nursing notes.  78 year old male with PMH as noted above presents with phimosis and urinary retention.  Bladder scan shows about 250 mL of urine in the bladder.  We will attempt a Foley placement here.  If unable, I will consult urology  Differential diagnosis includes, but is not limited to, phimosis, urinary retention.  Patient's presentation is most consistent with acute complicated illness / injury requiring diagnostic workup.  ----------------------------------------- 1:42 PM on 01/20/2024 -----------------------------------------  RN was unable to pass the Foley catheter.  I consulted Dr. Twylla from urology who evaluated the patient and was able to insert a Foley.  The patient now has good  urine output in the Foley bag.  He is much more comfortable.  He is stable for discharge home with outpatient urology follow-up.  I gave strict return precautions, and he expressed understanding.   FINAL CLINICAL IMPRESSION(S) / ED DIAGNOSES   Final diagnoses:  Phimosis  Urinary retention     Rx / DC Orders   ED Discharge Orders     None        Note:  This document was prepared using Dragon voice recognition software and may include unintentional dictation errors.    Jacolyn Pae, MD 01/20/24 1343

## 2024-01-20 NOTE — Consult Note (Signed)
 Urology Consult  Requesting physician: Waylon Cassis, MD  Reason for consultation: Urinary retention with inability to place Foley catheter   Assessment/Recommendations:  1.  Urinary retention Indwelling Foley catheter x 7-10 days Will schedule follow-up office visit next week with voiding trial Start tamsulosin 0.4 mg daily  2.  Phimosis Discussed circumcision versus dorsal slit on follow-up  Chief Complaint: Inability to urinate   History of Present Illness: ARIYAN [name] is a 78 y.o. male who presented to [facility] ED this morning complaining of urinary retention and inability to retract foreskin.  Seen at [facility] ED [facility] yesterday.  He was referred by the [facility] in [place] for inability to retract foreskin with urinary difficulty.  A bladder scan was performed which showed a volume of 95 mL and Foley catheter placement was not recommended.  Urinalysis showed pyuria and he was started on Vantin.  Urology was contacted and did not recommend Foley catheter placement based on low urine volume Presented today with worsening bladder discomfort and inability to void Attempts at catheter placement in the ED were not successful secondary to severe phimosis Prior history of stone disease  Past Medical History:  Diagnosis Date   Agent orange exposure    Baker's cyst of knee, left    Blood infection [facility]16/2018   [facility]    Cancer [facility])    testicular   Hearing loss d/t noise    Kidney stones     Past Surgical History:  Procedure Laterality Date   APPENDECTOMY  1996   BACK SURGERY  1996   Fusion L4-L5; Bulging disks L2-L4. L5-S1   EYE SURGERY Bilateral 1992   Radical Kernotomy   KIDNEY STONE SURGERY Left 05/18/2018   [facility]    KIDNEY STONE SURGERY Right 04/16/2018   [facility]     Home Medications:  No outpatient medications have been marked as taking for the 01/20/24 encounter [facility] Encounter).    Allergies:  Allergies  Allergen  Reactions   Penicillins Rash and Other (See Comments)    Reaction: nervousness Has patient had a PCN reaction causing immediate rash, facial/tongue/throat swelling, SOB or lightheadedness with hypotension: Yes Has patient had a PCN reaction causing severe rash involving mucus membranes or skin necrosis: No Has patient had a PCN reaction that required hospitalization: No Has patient had a PCN reaction occurring within the last 10 years: No If all of the above answers are NO, then may proceed with Cephalosporin use.     Family History  Problem Relation Age of Onset   Cerebral aneurysm Mother    Heart attack Father    Breast cancer Sister    Breast cancer Daughter     Social History:  reports that he has never smoked. He has never used smokeless tobacco. He reports that he does not drink alcohol and does not use drugs.  ROS: As per the HPI  Physical Exam:  Vital signs in last 24 hours: Temp:  [97.8 F (36.6 C)-98.2 F (36.8 C)] 98.2 F (36.8 C) (09/03 1417) Pulse Rate:  [55-106] 55 (09/03 1400) Resp:  [20-24] 20 (09/03 1400) BP: (135-179)/(49-124) 135/49 (09/03 1400) SpO2:  [98 %-100 %] 100 % (09/03 1400) Constitutional:  Alert and oriented, No acute distress HEENT: [place] AT Respiratory: Normal respiratory effort, lungs clear bilaterally GI: Abdomen is soft, nontender, nondistended, no abdominal masses GU: Severe phimosis with preputial pinpoint opening Psychiatric: Normal mood and affect   Procedure: The distal preputial skin in the  area of the pinpoint opening was anesthetized with 1 mL of 1% plain Xylocaine.  A small hemostat was placed through the pinpoint opening and the hemostat was opened to dilate this area in 2 perpendicular planes.  A 16 French Foley catheter was then guided in through the meatus which was not visible however once in the urethra was easily advanced into the bladder.   01/20/2024, 6:12 PM  [name],  MD

## 2024-01-20 NOTE — ED Triage Notes (Signed)
 Patient states he has had difficulty urinating since Saturday; was seen at the [facility] yesterday but they were unable to place foley.

## 2024-01-20 NOTE — ED Notes (Addendum)
 Attempted to start 45fr foley cath, but unsuccessful. Small hole is leaking urine, but unable to advance catheter as urethra is quite obstructed from foreskin.   Dr. Jacolyn notified and urology will be consulted.

## 2024-01-20 NOTE — ED Notes (Signed)
 Assumed care of pt

## 2024-01-20 NOTE — Discharge Instructions (Addendum)
 Continue taking the antibiotic that you are prescribed yesterday at [facility] and finish the full course.  Follow-up with urology next week to have the Foley removed and discuss possible circumcision.  Return to the ER immediately if the Foley is clogged or you are unable to pass urine through it, or if for any other new or worsening symptoms that concern you.

## 2024-01-24 DIAGNOSIS — R339 Retention of urine, unspecified: Secondary | ICD-10-CM | POA: Insufficient documentation

## 2024-01-24 DIAGNOSIS — N471 Phimosis: Secondary | ICD-10-CM | POA: Insufficient documentation

## 2024-01-24 NOTE — Progress Notes (Deleted)
   02/04/2024 6:18 PM   Jackson [name] Jan 17, 1946 [id number]  Reason for visit: Follow up urinary retention, phimosis   HPI: Initial follow up with me today Recently evaluated by Dr. Twylla in ED 01/20/24 for AUR and phimosis   -s/p preputial dilation w/ hemostats at bedside, 69F Foley placed  UA/Ucx (01/19/24, [facility]) - 100k Proteus, Res to Macrobid 7d course of cefpodoxime  Prior HPI:   Physical Exam: There were no vitals taken for this visit.   Constitutional:  Alert and oriented, No acute distress.  GU: ***  Laboratory Data: Ref Range & Units 01/19/24  Urine Color Yellow Orange Abnormal   Urine Clarity Clear Turbid Abnormal   Urine Specific Gravity 1.005 - 1.030 1.034 High   Urine pH 5 to 9 8.5  Urine Protein - Dipstick Negative mg/dl 699 Abnormal   Urine Glucose Negative mg/dL Negative  Urine Ketones Negative mg/dl Negative  Urine Bilirubin Negative mg/dL Negative  Urine Blood Negative mg/dL 1.0 Abnormal   Urine Nitrite Negative Positive Abnormal   Urine Urobilinogen <2 mg/dl <2  Urine Leukocyte Esterase Negative Leu/mcL 500 Abnormal   Urine Squamous Epithelial Cells 0 - 2 /HPF 0-2  Urine WBC 0 - 2 /hpf >50 Abnormal   Urine RBC 0 - 2 /HPF 75-100 Abnormal   Urine Bacteria None /HPF 4+ Abnormal   UA Microscopic No Micro Yes Micro Abnormal     Pertinent Imaging: N/A   Assessment & Plan:    Urinary retention Assessment & Plan: AUR 01/20/24, Foley placed Proteus UTI, severe phimosis    Phimosis Assessment & Plan: Severe phimosis Required dilation for catheter placement         Penne JONELLE Skye, MD  [facility] [address] [place], [place] [address] [phone]

## 2024-01-24 NOTE — Assessment & Plan Note (Deleted)
 AUR 01/20/24, Foley placed Proteus UTI, severe phimosis

## 2024-01-24 NOTE — Assessment & Plan Note (Deleted)
 Severe phimosis Required dilation for catheter placement

## 2024-01-25 ENCOUNTER — Encounter (HOSPITAL_COMMUNITY): Payer: Self-pay | Admitting: Licensed Clinical Social Worker

## 2024-01-25 ENCOUNTER — Ambulatory Visit (HOSPITAL_COMMUNITY): Admitting: Licensed Clinical Social Worker

## 2024-01-25 DIAGNOSIS — F4310 Post-traumatic stress disorder, unspecified: Secondary | ICD-10-CM

## 2024-01-25 NOTE — Progress Notes (Signed)
 Virtual Visit via Video Note  I connected with [name]eser on 01/25/24 at 10-10:45am by a video-enabled telemedicine application and verified that I am speaking with the correct person using two identifiers.   I discussed the limitations of evaluation and management by telemedicine and the availability of in person appointments. The patient expressed understanding and agreed to proceed.   LOCATION: Patient: Home Provider: Home Office   History of Present Illness: Pt was referred to therapy by the [facility] for PTSD.    Participation Level: Active     Type of Therapy: Virtual Individual therapy   Treatment Goal addressed: Meet with clinician weekly for therapy to monitor for progress towards goals and address any barriers to success; Reduce depression from average severity level of 6/10 down to a 4/10 in next 6 mos by engaging in 1-2 positive coping skills for daily as part of developing self-care routine; Reduce average anxiety level from 7/10 down to 5/10 in next 6 mos by utilizing 3-5 relaxation skills/grounding skills per day, such as mindful breathing, progressive muscle relaxation, positive visualizations   Progression towards Goal: Progressing   Interventions: CBT/Supportive/Psychoeducation   Summary: Patient presented for today's session on time and was alert, oriented x5, with no evidence or self-report of SI/HI or A/V H. Patient reported ongoing compliance with medication.  Clinician inquired about patient's current emotional ratings, as well as any significant changes in thoughts, feelings or behavior since previous session. Patient reported his emotional ratings 610 for depression, 5/10 for anxiety, 310 for anger/irritability. [name] asked open ended questions.  Pt reports on his maladaptive behaviors and thinking patterns over the past week, which interferes with resolution of his trauma symptoms 3/3 X within the past week. Pt reported 2 nightmare over the past week.[name] and  pt reviewed his trauma symptoms over the past week.  We discussed the impact his stressors have had on pt as well as how he copes with them. Pt reports on his health, family, va dr visits, and pain level, which remains chronic. [name] asked open ended questions. [name] and pt  focused on stressors pt is facing as he is also navigating all his health concerns.  I was in the ED 2x last week due to inability to urinate, discharged with medication. Cl asked open ended questions. Again, cln continuing to use CBT for pain relief.      Collaboration of Care: Other: Continue to see providers at [facility]   Patient/Guardian was advised Release of Information must be obtained prior to any record release in order to collaborate their care with an outside provider. Patient/Guardian was advised if they have not alreay done so to contact the registration department to sign all necessary forms in order for us  to release information regarding their care.    Consent: Patient/Guardian gives verbal consent for treatment and assignment of benefits for services provided during this visit. Patient/Guardian expressed understanding and agreed to proceed.          Assessment and plan: Counselor will continue to meet with patient to address treatment plan goals. Patient will continue to follow recommendations of providers and implement skills learned in session, practicing skills between sessions. Diagnosis:  PTSD    Suicidal/Homicidal: Nowithout intent/plan   Therapist Response: Assessed pt's current functioning and reviewed progress.. Assisted pt processing stressors, negative thoughts.  Assisted pt processing for the management of his stressors.   Participation Level: Active     Follow Up Instructions: I discussed the assessment and treatment plan  with the patient. The patient was provided an opportunity to ask questions and all were answered. The patient agreed with the plan and demonstrated an understanding of the  instructions.   The patient was advised to call back or seek an in-person evaluation if the symptoms worsen or if the condition fails to improve as anticipated.   I provided 60 minutes of non-face-to-face time during this encounter.     Lamyia Cdebaca S, LCAS  01/25/24

## 2024-02-01 ENCOUNTER — Ambulatory Visit (INDEPENDENT_AMBULATORY_CARE_PROVIDER_SITE_OTHER): Admitting: Licensed Clinical Social Worker

## 2024-02-01 DIAGNOSIS — F4310 Post-traumatic stress disorder, unspecified: Secondary | ICD-10-CM

## 2024-02-03 ENCOUNTER — Encounter (HOSPITAL_COMMUNITY): Payer: Self-pay | Admitting: Licensed Clinical Social Worker

## 2024-02-03 NOTE — Progress Notes (Signed)
 Virtual Visit via Video Note  I connected with [name]uraishi on 02/01/24 at 10-10:45am by a video-enabled telemedicine application and verified that I am speaking with the correct person using two identifiers.   I discussed the limitations of evaluation and management by telemedicine and the availability of in person appointments. The patient expressed understanding and agreed to proceed.   LOCATION: Patient: Home Provider: Home Office   History of Present Illness: Pt was referred to therapy by the [facility] for PTSD.    Participation Level: Active     Type of Therapy: Virtual Individual therapy   Treatment Goal addressed: Meet with clinician weekly for therapy to monitor for progress towards goals and address any barriers to success; Reduce depression from average severity level of 6/10 down to a 4/10 in next 6 mos by engaging in 1-2 positive coping skills for daily as part of developing self-care routine; Reduce average anxiety level from 7/10 down to 5/10 in next 6 mos by utilizing 3-5 relaxation skills/grounding skills per day, such as mindful breathing, progressive muscle relaxation, positive visualizations   Progression towards Goal: Progressing   Interventions: CBT/Supportive/Psychoeducation   Summary: Patient presented for today's session on time and was alert, oriented x5, with no evidence or self-report of SI/HI or A/V H. Patient reported ongoing compliance with medication.  Clinician inquired about patient's current emotional ratings, as well as any significant changes in thoughts, feelings or behavior since previous session. Patient reported his emotional ratings 610 for depression, 5/10 for anxiety, 310 for anger/irritability. [name] asked open ended questions.  Pt reports on his maladaptive behaviors and thinking patterns over the past week, which interferes with resolution of his trauma symptoms 3/3 X within the past week. Pt reported 2 nightmare over the past week.[name]  and pt reviewed his trauma symptoms over the past week.  We discussed the impact his stressors have had on pt as well as how he copes with them. Pt reports on his health, family, va dr visits, and pain level, which remains chronic. [name] asked open ended questions. [name] and pt  focused on stressors pt is facing as he is also navigating all his health concerns. [name] asked open ended questions about his kidneys. I have an appt with a urologist at [facility]. I'm still using a catheter.. I may have to have surgery. My quality of life is poor currently. [name] asked open ended questions. And used cbt to assist pt in understanding his health issues.     Collaboration of Care: Other: Continue to see providers at [facility]   Patient/Guardian was advised Release of Information must be obtained prior to any record release in order to collaborate their care with an outside provider. Patient/Guardian was advised if they have not alreay done so to contact the registration department to sign all necessary forms in order for us  to release information regarding their care.    Consent: Patient/Guardian gives verbal consent for treatment and assignment of benefits for services provided during this visit. Patient/Guardian expressed understanding and agreed to proceed.          Assessment and plan: Counselor will continue to meet with patient to address treatment plan goals. Patient will continue to follow recommendations of providers and implement skills learned in session, practicing skills between sessions. Diagnosis:  PTSD    Suicidal/Homicidal: Nowithout intent/plan   Therapist Response: Assessed pt's current functioning and reviewed progress.. Assisted pt processing stressors, negative thoughts.  Assisted pt processing for the management of his stressors.  Participation Level: Active     Follow Up Instructions: I discussed the assessment and treatment plan with the patient. The patient was provided an opportunity to  ask questions and all were answered. The patient agreed with the plan and demonstrated an understanding of the instructions.   The patient was advised to call back or seek an in-person evaluation if the symptoms worsen or if the condition fails to improve as anticipated.   I provided 60 minutes of non-face-to-face time during this encounter.     Xyon Lukasik S, LCAS  02/01/24

## 2024-02-04 ENCOUNTER — Ambulatory Visit: Admitting: Urology

## 2024-02-04 DIAGNOSIS — R339 Retention of urine, unspecified: Secondary | ICD-10-CM

## 2024-02-04 DIAGNOSIS — N471 Phimosis: Secondary | ICD-10-CM

## 2024-02-08 ENCOUNTER — Encounter (HOSPITAL_COMMUNITY): Payer: Self-pay

## 2024-02-08 ENCOUNTER — Ambulatory Visit (HOSPITAL_COMMUNITY): Admitting: Licensed Clinical Social Worker

## 2024-02-09 ENCOUNTER — Ambulatory Visit (HOSPITAL_COMMUNITY): Admitting: Licensed Clinical Social Worker

## 2024-02-15 ENCOUNTER — Ambulatory Visit (HOSPITAL_COMMUNITY): Admitting: Licensed Clinical Social Worker

## 2024-02-15 ENCOUNTER — Encounter (HOSPITAL_COMMUNITY): Payer: Self-pay | Admitting: Licensed Clinical Social Worker

## 2024-02-15 ENCOUNTER — Ambulatory Visit (INDEPENDENT_AMBULATORY_CARE_PROVIDER_SITE_OTHER): Admitting: Licensed Clinical Social Worker

## 2024-02-15 DIAGNOSIS — F4310 Post-traumatic stress disorder, unspecified: Secondary | ICD-10-CM

## 2024-02-15 NOTE — Progress Notes (Signed)
 Virtual Visit via Video Note  I connected with [name] on 02/15/24 at 10-10:45am by a video-enabled telemedicine application and verified that I am speaking with the correct person using two identifiers.   I discussed the limitations of evaluation and management by telemedicine and the availability of in person appointments. The patient expressed understanding and agreed to proceed.   LOCATION: Patient: Home Provider: Home Office   History of Present Illness: Pt was referred to therapy by the [facility] for PTSD.    Participation Level: Active     Type of Therapy: Virtual Individual therapy   Treatment Goal addressed: Meet with clinician weekly for therapy to monitor for progress towards goals and address any barriers to success; Reduce depression from average severity level of 6/10 down to a 4/10 in next 6 mos by engaging in 1-2 positive coping skills for daily as part of developing self-care routine; Reduce average anxiety level from 7/10 down to 5/10 in next 6 mos by utilizing 3-5 relaxation skills/grounding skills per day, such as mindful breathing, progressive muscle relaxation, positive visualizations   Progression towards Goal: Progressing   Interventions: CBT/Supportive/Psychoeducation   Summary: Patient presented for today's session on time and was alert, oriented x5, with no evidence or self-report of SI/HI or A/V H. Patient reported ongoing compliance with medication.  Clinician inquired about patient's current emotional ratings, as well as any significant changes in thoughts, feelings or behavior since previous session. Patient reported his emotional ratings 610 for depression, 5/10 for anxiety, 310 for anger/irritability. [name] asked open ended questions. Pt reports on his maladaptive behaviors and thinking patterns over the past week, which interferes with resolution of his trauma symptoms 3/3 X within the past week. Pt reported 2 nightmare over the past week.[name] and  pt reviewed his trauma symptoms over the past week.  We discussed the impact his stressors have had on pt as well as how he copes with them. Pt reports on his health, family, va dr visits, and pain level, which remains chronic. [name] asked open ended questions. [name] and pt  focused on stressors pt is facing as he is also navigating all his health concerns. [name] asked open ended questions about his kidneys. they took the catheter out and I'm having no       issues with my kidneys. I saw the  urologist at [facility] who took out the catheter, no surgery so far. I feel so much better. [name] provided psychotherapy on low frustration tolerance.    [name]     Collaboration of Care: Other: Continue to see providers at [facility]   Patient/Guardian was advised Release of Information must be obtained prior to any record release in order to collaborate their care with an outside provider. Patient/Guardian was advised if they have not alreay done so to contact the registration department to sign all necessary forms in order for us  to release information regarding their care.    Consent: Patient/Guardian gives verbal consent for treatment and assignment of benefits for services provided during this visit. Patient/Guardian expressed understanding and agreed to proceed.          Assessment and plan: Counselor will continue to meet with patient to address treatment plan goals. Patient will continue to follow recommendations of providers and implement skills learned in session, practicing skills between sessions. Diagnosis:  PTSD    Suicidal/Homicidal: Nowithout intent/plan   Therapist Response: Assessed pt's current functioning and reviewed progress.. Assisted pt processing stressors, negative thoughts.  Assisted pt  processing for the management of his stressors.   Participation Level: Active     Follow Up Instructions: I discussed the assessment and treatment plan with the patient. The patient was provided an opportunity  to ask questions and all were answered. The patient agreed with the plan and demonstrated an understanding of the instructions.   The patient was advised to call back or seek an in-person evaluation if the symptoms worsen or if the condition fails to improve as anticipated.   I provided 60 minutes of non-face-to-face time during this encounter.     Mykaylah Ballman S, LCAS  02/15/24

## 2024-02-22 ENCOUNTER — Ambulatory Visit (HOSPITAL_COMMUNITY): Admitting: Licensed Clinical Social Worker

## 2024-02-29 ENCOUNTER — Ambulatory Visit (HOSPITAL_COMMUNITY): Admitting: Licensed Clinical Social Worker

## 2024-03-01 ENCOUNTER — Ambulatory Visit (INDEPENDENT_AMBULATORY_CARE_PROVIDER_SITE_OTHER): Admitting: Licensed Clinical Social Worker

## 2024-03-01 DIAGNOSIS — F4310 Post-traumatic stress disorder, unspecified: Secondary | ICD-10-CM

## 2024-03-02 ENCOUNTER — Encounter (HOSPITAL_COMMUNITY): Payer: Self-pay | Admitting: Licensed Clinical Social Worker

## 2024-03-02 NOTE — Progress Notes (Signed)
 In Person Visit Note  I connected with Jackson [name] on 03/01/24 at 3-3:45 pm by an in person visit.    LOCATION: Patient: Office Provider: Office   History of Present Illness: Pt was referred to therapy by the [facility] for PTSD.    Participation Level: Active     Type of Therapy: In person Individual therapy   Treatment Goal addressed: Meet with clinician weekly for therapy to monitor for progress towards goals and address any barriers to success; Reduce depression from average severity level of 6/10 down to a 4/10 in next 6 mos by engaging in 1-2 positive coping skills for daily as part of developing self-care routine; Reduce average anxiety level from 7/10 down to 5/10 in next 6 mos by utilizing 3-5 relaxation skills/grounding skills per day, such as mindful breathing, progressive muscle relaxation, positive visualizations   Progression towards Goal: Progressing   Interventions: CBT/Supportive/Psychoeducation   Summary: Patient presented for today's session on time and was alert, oriented x5, with no evidence or self-report of SI/HI or A/V H. Patient reported ongoing compliance with medication.  Clinician inquired about patient's current emotional ratings, as well as any significant changes in thoughts, feelings or behavior since previous session. Patient reported his emotional ratings 610 for depression, 5/10 for anxiety, 310 for anger/irritability. [name] asked open ended questions. Pt reports on his maladaptive behaviors and thinking patterns over the past week, which interferes with resolution of his trauma symptoms 3/3 X within the past week. Pt reported 3 nightmare over the past week.[name] and pt reviewed his trauma symptoms over the past week.  We discussed the impact his stressors have had on pt as well as how he copes with them. [name] suggested pt talk with his medication management provider about his increase in nightmares. Pt reports on his health, family, va dr visits, and  pain level, which remains chronic. [name] asked open ended questions. [name] and pt  focused on stressors pt is facing as he is also navigating all his health concerns. [name] asked open ended questions about his kidneys, went to the [place] today to meet with the kidney dr and he was not in so it's been rescheduled. . Again,[name] provided psychotherapy on low frustration tolerance due to his continued health probs and dealing with providers at the [place].    [name]     Collaboration of Care: Other: Continue to see providers at [facility]   Patient/Guardian was advised Release of Information must be obtained prior to any record release in order to collaborate their care with an outside provider. Patient/Guardian was advised if they have not alreay done so to contact the registration department to sign all necessary forms in order for us  to release information regarding their care.    Consent: Patient/Guardian gives verbal consent for treatment and assignment of benefits for services provided during this visit. Patient/Guardian expressed understanding and agreed to proceed.          Assessment and plan: Counselor will continue to meet with patient to address treatment plan goals. Patient will continue to follow recommendations of providers and implement skills learned in session, practicing skills between sessions. Diagnosis:  PTSD    Suicidal/Homicidal: Nowithout intent/plan   Therapist Response: Assessed pt's current functioning and reviewed progress.. Assisted pt processing stressors, negative thoughts.  Assisted pt processing for the management of his stressors.   Participation Level: Active     Follow Up Instructions: I discussed the assessment and treatment plan with the patient. The  patient was provided an opportunity to ask questions and all were answered. The patient agreed with the plan and demonstrated an understanding of the instructions.   The patient was advised to call back or seek an in-person evaluation  if the symptoms worsen or if the condition fails to improve as anticipated.   I provided 45 minutes of in person time during this encounter.     Santiago Stenzel S, LCAS  03/01/24

## 2024-03-07 ENCOUNTER — Ambulatory Visit (INDEPENDENT_AMBULATORY_CARE_PROVIDER_SITE_OTHER): Admitting: Licensed Clinical Social Worker

## 2024-03-07 ENCOUNTER — Ambulatory Visit (HOSPITAL_COMMUNITY): Admitting: Licensed Clinical Social Worker

## 2024-03-07 DIAGNOSIS — F4310 Post-traumatic stress disorder, unspecified: Secondary | ICD-10-CM | POA: Diagnosis not present

## 2024-03-09 ENCOUNTER — Encounter (HOSPITAL_COMMUNITY): Payer: Self-pay | Admitting: Licensed Clinical Social Worker

## 2024-03-09 NOTE — Progress Notes (Unsigned)
 Virtual Visit via Video Note  I connected with Jackson [name]. Jackson [name] on 03/09/24 at 10-10:45am by a video-enabled telemedicine application and verified that I am speaking with the correct person using two identifiers.   I discussed the limitations of evaluation and management by telemedicine and the availability of in person appointments. The patient expressed understanding and agreed to proceed.   LOCATION: Patient: Home Provider: Home Office   History of Present Illness: Pt was referred to therapy by the [facility] for PTSD.    Participation Level: Active     Type of Therapy: Virtual Individual therapy   Treatment Goal addressed: Meet with clinician weekly for therapy to monitor for progress towards goals and address any barriers to success; Reduce depression from average severity level of 6/10 down to a 4/10 in next 6 mos by engaging in 1-2 positive coping skills for daily as part of developing self-care routine; Reduce average anxiety level from 7/10 down to 5/10 in next 6 mos by utilizing 3-5 relaxation skills/grounding skills per day, such as mindful breathing, progressive muscle relaxation, positive visualizations   Progression towards Goal: Progressing   Interventions: CBT/Supportive/Psychoeducation   Summary: Patient presented for today'[name] session on time and was alert, oriented x5, with no evidence or self-report of SI/HI or A/V H. Patient reported ongoing compliance with medication.  Clinician inquired about patient'[name] current emotional ratings, as well as any significant changes in thoughts, feelings or behavior since previous session. Patient reported his emotional ratings 610 for depression, 5/10 for anxiety, 310 for anger/irritability. [name] asked open ended questions. Pt reports on his maladaptive behaviors and thinking patterns over the past week, which interferes with resolution of his trauma symptoms 3/3 X within the past week. Pt reported 2 nightmare over the past week.[name]  and pt reviewed his trauma symptoms over the past week.  We discussed the impact his stressors have had on pt as well as how he copes with them. Pt reports on his health, family, va dr visits, and pain level, which remains chronic. [name] asked open ended questions. I went to kidney dr and they are concerned about one of my kidneys only working 20%. I may have to have surgery plus a circumcised.       Jackson [name]     Collaboration of Care: Other: Continue to see providers at [facility]   Patient/Guardian was advised Release of Information must be obtained prior to any record release in order to collaborate their care with an outside provider. Patient/Guardian was advised if they have not alreay done so to contact the registration department to sign all necessary forms in order for us  to release information regarding their care.    Consent: Patient/Guardian gives verbal consent for treatment and assignment of benefits for services provided during this visit. Patient/Guardian expressed understanding and agreed to proceed.          Assessment and plan: Counselor will continue to meet with patient to address treatment plan goals. Patient will continue to follow recommendations of providers and implement skills learned in session, practicing skills between sessions. Diagnosis:  PTSD    Suicidal/Homicidal: Nowithout intent/plan   Therapist Response: Assessed pt'[name] current functioning and reviewed progress.. Assisted pt processing stressors, negative thoughts.  Assisted pt processing for the management of his stressors.   Participation Level: Active     Follow Up Instructions: I discussed the assessment and treatment plan with the patient. The patient was provided an opportunity to ask questions and all were answered. The patient agreed  with the plan and demonstrated an understanding of the instructions.   The patient was advised to call back or seek an in-person evaluation if the symptoms worsen or if the  condition fails to improve as anticipated.   I provided 60 minutes of non-face-to-face time during this encounter.     Jackson [name], LCAS  03/07/24

## 2024-03-14 ENCOUNTER — Ambulatory Visit (HOSPITAL_COMMUNITY): Admitting: Licensed Clinical Social Worker

## 2024-03-14 ENCOUNTER — Ambulatory Visit (INDEPENDENT_AMBULATORY_CARE_PROVIDER_SITE_OTHER): Admitting: Licensed Clinical Social Worker

## 2024-03-14 ENCOUNTER — Encounter (HOSPITAL_COMMUNITY): Payer: Self-pay | Admitting: Licensed Clinical Social Worker

## 2024-03-14 DIAGNOSIS — F4310 Post-traumatic stress disorder, unspecified: Secondary | ICD-10-CM | POA: Diagnosis not present

## 2024-03-14 NOTE — Progress Notes (Signed)
 Virtual Visit via Video Note  I connected with [name] Wenke on 03/14/24 at 12-2:45pm by a video-enabled telemedicine application and verified that I am speaking with the correct person using two identifiers.   I discussed the limitations of evaluation and management by telemedicine and the availability of in person appointments. The patient expressed understanding and agreed to proceed.   LOCATION: Patient: Home Provider: Home Office   History of Present Illness: Pt was referred to therapy by the [facility] for PTSD.    Participation Level: Active     Type of Therapy: Virtual Individual therapy   Treatment Goal addressed: Meet with clinician weekly for therapy to monitor for progress towards goals and address any barriers to success; Reduce depression from average severity level of 6/10 down to a 4/10 in next 6 mos by engaging in 1-2 positive coping skills for daily as part of developing self-care routine; Reduce average anxiety level from 7/10 down to 5/10 in next 6 mos by utilizing 3-5 relaxation skills/grounding skills per day, such as mindful breathing, progressive muscle relaxation, positive visualizations   Progression towards Goal: Progressing   Interventions: CBT/Supportive/Psychoeducation   Summary: Patient presented for today's session on time and was alert, oriented x5, with no evidence or self-report of SI/HI or A/V H. Patient reported ongoing compliance with medication.  Clinician inquired about patient's current emotional ratings, as well as any significant changes in thoughts, feelings or behavior since previous session. Patient reported his emotional ratings 610 for depression, 5/10 for anxiety, 310 for anger/irritability. [name] asked open ended questions. Pt reports on his maladaptive behaviors and thinking patterns over the past week, which interferes with resolution of his trauma symptoms 2/3 X within the past week. Pt reported 2 nightmare over the past week.[name] and  pt reviewed his trauma symptoms over the past week.  We discussed the impact his stressors have had on pt as well as how he copes with them. Pt reports on his health, family, va dr visits, and pain level, which remains chronic. [name] asked open ended questions. I went to kidney dr and they are concerned about one of my kidneys only working 20%.I got my results back from urinalysis and I have a kidney infection, so they are mailing me an antibiotic. Again, cln used CBT to assist pt in learning to be productive even when physical health is poor. Again, cln used CBT to help pt process thoughts, feelings, and behaviors. Again, cln provided supportive feedback.       [name]     Collaboration of Care: Other: Continue to see providers at [facility]   Patient/Guardian was advised Release of Information must be obtained prior to any record release in order to collaborate their care with an outside provider. Patient/Guardian was advised if they have not alreay done so to contact the registration department to sign all necessary forms in order for us  to release information regarding their care.    Consent: Patient/Guardian gives verbal consent for treatment and assignment of benefits for services provided during this visit. Patient/Guardian expressed understanding and agreed to proceed.          Assessment and plan: Counselor will continue to meet with patient to address treatment plan goals. Patient will continue to follow recommendations of providers and implement skills learned in session, practicing skills between sessions. Diagnosis:  PTSD    Suicidal/Homicidal: Nowithout intent/plan   Therapist Response: Assessed pt's current functioning and reviewed progress.. Assisted pt processing stressors, negative thoughts.  Assisted pt  processing for the management of his stressors.   Participation Level: Active     Follow Up Instructions: I discussed the assessment and treatment plan with the patient. The patient  was provided an opportunity to ask questions and all were answered. The patient agreed with the plan and demonstrated an understanding of the instructions.   The patient was advised to call back or seek an in-person evaluation if the symptoms worsen or if the condition fails to improve as anticipated.   I provided 45 minutes of non-face-to-face time during this encounter.     Loris Seelye S, LCAS  03/14/24

## 2024-03-21 ENCOUNTER — Encounter (HOSPITAL_COMMUNITY): Payer: Self-pay | Admitting: Licensed Clinical Social Worker

## 2024-03-21 ENCOUNTER — Ambulatory Visit (INDEPENDENT_AMBULATORY_CARE_PROVIDER_SITE_OTHER): Admitting: Licensed Clinical Social Worker

## 2024-03-21 DIAGNOSIS — F4310 Post-traumatic stress disorder, unspecified: Secondary | ICD-10-CM | POA: Diagnosis not present

## 2024-03-21 NOTE — Progress Notes (Signed)
 Virtual Visit via Video Note  I connected with [name]. Jackson [name] on 03/21/24 at 2-2:45pm by a video-enabled telemedicine application and verified that I am speaking with the correct person using two identifiers.   I discussed the limitations of evaluation and management by telemedicine and the availability of in person appointments. The patient expressed understanding and agreed to proceed.   LOCATION: Patient: Home Provider: Home Office   History of Present Illness: Pt was referred to therapy by the [facility] for PTSD.    Participation Level: Active     Type of Therapy: Virtual Individual therapy   Treatment Goal addressed: Meet with clinician weekly for therapy to monitor for progress towards goals and address any barriers to success; Reduce depression from average severity level of 6/10 down to a 4/10 in next 6 mos by engaging in 1-2 positive coping skills for daily as part of developing self-care routine; Reduce average anxiety level from 7/10 down to 5/10 in next 6 mos by utilizing 3-5 relaxation skills/grounding skills per day, such as mindful breathing, progressive muscle relaxation, positive visualizations   Progression towards Goal: Progressing   Interventions: CBT/Supportive/Psychoeducation   Summary: Patient presented for today's session on time and was alert, oriented x5, with no evidence or self-report of SI/HI or A/V H. Patient reported ongoing compliance with medication.  Clinician inquired about patient's current emotional ratings, as well as any significant changes in thoughts, feelings or behavior since previous session. Patient reported his emotional ratings 610 for depression, 5/10 for anxiety, 310 for anger/irritability. [name] asked open ended questions. Pt reports on his maladaptive behaviors and thinking patterns over the past week, which interferes with resolution of his trauma symptoms 2/3 X within the past week. Pt reported 2 nightmare over the past week.[name] and  pt reviewed his trauma symptoms over the past week.  We discussed the impact his stressors have had on pt as well as how he copes with them. Pt reports on his health, family, va dr visits, and pain level, which remains chronic. [name] asked open ended questions. I went to kidney dr and they are concerned about one of my kidneys only working 20%.I got my results back from urinalysis and I have a kidney infection, so they are mailing me an antibiotic.  I haven't received my antibiotics in the mail from the [place]. [name] contacted [employer] with the tracking # and it says delivered. [name]. [name] gave the correct address and confirmed he will receive it tomorrow. Pt was diagnosed 2 weeks ago with kidney infection and has not received any antibiotics. Pt reports he wants to reschedule appt next week due to going to court with daughter for custody and child support. [name] asked open ended questions.        [name]     Collaboration of Care: Other: Continue to see providers at [facility]   Patient/Guardian was advised Release of Information must be obtained prior to any record release in order to collaborate their care with an outside provider. Patient/Guardian was advised if they have not alreay done so to contact the registration department to sign all necessary forms in order for us  to release information regarding their care.    Consent: Patient/Guardian gives verbal consent for treatment and assignment of benefits for services provided during this visit. Patient/Guardian expressed understanding and agreed to proceed.          Assessment and plan: Counselor will continue to  meet with patient to address treatment plan goals. Patient will continue to follow recommendations of providers and implement skills learned in session, practicing skills between sessions. Diagnosis:  PTSD    Suicidal/Homicidal: Nowithout intent/plan   Therapist Response: Assessed pt's  current functioning and reviewed progress.. Assisted pt processing stressors, negative thoughts.  Assisted pt processing for the management of his stressors.   Participation Level: Active     Follow Up Instructions: I discussed the assessment and treatment plan with the patient. The patient was provided an opportunity to ask questions and all were answered. The patient agreed with the plan and demonstrated an understanding of the instructions.   The patient was advised to call back or seek an in-person evaluation if the symptoms worsen or if the condition fails to improve as anticipated.   I provided 45 minutes of non-face-to-face time during this encounter.     Lam Bjorklund S, LCAS  03/21/24

## 2024-03-28 ENCOUNTER — Ambulatory Visit (HOSPITAL_COMMUNITY): Admitting: Licensed Clinical Social Worker

## 2024-03-30 ENCOUNTER — Encounter (HOSPITAL_COMMUNITY): Payer: Self-pay | Admitting: Licensed Clinical Social Worker

## 2024-03-30 ENCOUNTER — Ambulatory Visit (HOSPITAL_COMMUNITY): Admitting: Licensed Clinical Social Worker

## 2024-03-30 DIAGNOSIS — F4310 Post-traumatic stress disorder, unspecified: Secondary | ICD-10-CM | POA: Diagnosis not present

## 2024-03-30 NOTE — Progress Notes (Signed)
 Virtual Visit via Video Note  I connected with Jackson [name]. [place] on 03/30/24 at 2-2:45pm by a video-enabled telemedicine application and verified that I am speaking with the correct person using two identifiers.   I discussed the limitations of evaluation and management by telemedicine and the availability of in person appointments. The patient expressed understanding and agreed to proceed.   LOCATION: Patient: Home Provider: Home Office   History of Present Illness: Pt was referred to therapy by the [facility] for PTSD.    Participation Level: Active     Type of Therapy: Virtual Individual therapy   Treatment Goal addressed: Meet with clinician weekly for therapy to monitor for progress towards goals and address any barriers to success; Reduce depression from average severity level of 6/10 down to a 4/10 in next 6 mos by engaging in 1-2 positive coping skills for daily as part of developing self-care routine; Reduce average anxiety level from 7/10 down to 5/10 in next 6 mos by utilizing 3-5 relaxation skills/grounding skills per day, such as mindful breathing, progressive muscle relaxation, positive visualizations   Progression towards Goal: Progressing   Interventions: CBT/Supportive/Psychoeducation   Summary: Patient presented for today's session on time and was alert, oriented x5, with no evidence or self-report of SI/HI or A/V H. Patient reported ongoing compliance with medication.  Clinician inquired about patient's current emotional ratings, as well as any significant changes in thoughts, feelings or behavior since previous session. Patient reported his emotional ratings 5/10 for depression, 5/10 for anxiety, 310 for anger/irritability. [name] asked open ended questions. Pt reports on his maladaptive behaviors and thinking patterns over the past week, which interferes with resolution of his trauma symptoms 2/3 X within the past week. Pt reported 2 nightmare over the past week.[name] and  pt reviewed his trauma symptoms over the past week.  We discussed the impact his stressors have had on pt as well as how he copes with them. Pt reports on his health, family, va dr visits, and pain level, which remains chronic. [name] asked open ended questions. I went to kidney dr and they are concerned about one of my kidneys only working 20%.I got my results back from urinalysis and I have a kidney infection, so they are mailing me an antibiotic.  I haven't received my antibiotics in the mail from the [place]. [name] contacted [employer] with the tracking # and it says delivered. [name]. [name] gave the correct address and confirmed he will receive it tomorrow. Pt was diagnosed 2 weeks ago with kidney infection and has not received any antibiotics. Pt went to court with daughter for custody and child support. The court case is continuing the rest of the week. [name] asked open ended questions. Pt discussed his [employer], cln thanked pt for his service. 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         [name]     Collaboration of Care: Other: Continue to see providers at [facility]   Patient/Guardian was advised Release of Information must be obtained prior to any record release in order to collaborate their care with an outside provider. Patient/Guardian was advised if they have not alreay done so to contact the registration department to sign all necessary forms in order for us  to release information regarding their care.    Consent: Patient/Guardian gives verbal consent for treatment and assignment of benefits for services provided during  this visit. Patient/Guardian expressed understanding and agreed to proceed.          Assessment and plan: Counselor will continue to meet with patient to address treatment plan goals. Patient will continue to follow recommendations of providers and implement skills learned in session, practicing skills  between sessions. Diagnosis:  PTSD    Suicidal/Homicidal: Nowithout intent/plan   Therapist Response: Assessed pt's current functioning and reviewed progress.. Assisted pt processing stressors, negative thoughts.  Assisted pt processing for the management of his stressors.   Participation Level: Active     Follow Up Instructions: I discussed the assessment and treatment plan with the patient. The patient was provided an opportunity to ask questions and all were answered. The patient agreed with the plan and demonstrated an understanding of the instructions.   The patient was advised to call back or seek an in-person evaluation if the symptoms worsen or if the condition fails to improve as anticipated.   I provided 45 minutes of non-face-to-face time during this encounter.     Kail Fraley S, LCAS  03/30/24

## 2024-04-04 ENCOUNTER — Ambulatory Visit (INDEPENDENT_AMBULATORY_CARE_PROVIDER_SITE_OTHER): Admitting: Licensed Clinical Social Worker

## 2024-04-04 ENCOUNTER — Encounter (HOSPITAL_COMMUNITY): Payer: Self-pay | Admitting: Licensed Clinical Social Worker

## 2024-04-04 DIAGNOSIS — F4310 Post-traumatic stress disorder, unspecified: Secondary | ICD-10-CM

## 2024-04-04 NOTE — Progress Notes (Signed)
 Virtual Visit via Video Note  I connected with [name] [place]itage on 04/04/24 at 2-2:45pm by a video-enabled telemedicine application and verified that I am speaking with the correct person using two identifiers.   I discussed the limitations of evaluation and management by telemedicine and the availability of in person appointments. The patient expressed understanding and agreed to proceed.   LOCATION: Patient: Home Provider: Home Office   History of Present Illness: Pt was referred to therapy by the [facility] for PTSD.    Participation Level: Active     Type of Therapy: Virtual Individual therapy   Treatment Goal addressed: Meet with clinician weekly for therapy to monitor for progress towards goals and address any barriers to success; Reduce depression from average severity level of 6/10 down to a 4/10 in next 6 mos by engaging in 1-2 positive coping skills for daily as part of developing self-care routine; Reduce average anxiety level from 7/10 down to 5/10 in next 6 mos by utilizing 3-5 relaxation skills/grounding skills per day, such as mindful breathing, progressive muscle relaxation, positive visualizations   Progression towards Goal: Progressing   Interventions: CBT/Supportive/Psychoeducation   Summary: Patient presented for today's session on time and was alert, oriented x5, with no evidence or self-report of SI/HI or A/V H. Patient reported ongoing compliance with medication.  Clinician inquired about patient's current emotional ratings, as well as any significant changes in thoughts, feelings or behavior since previous session. Patient reported his emotional ratings 5/10 for depression, 5/10 for anxiety, 2/10 for anger/irritability. [name] asked open ended questions. Pt reports on his maladaptive behaviors and thinking patterns over the past week, which interferes with resolution of his trauma symptoms 2/3 X within the past week. Pt reported 2 nightmare over the past week.[name]  and pt reviewed his trauma symptoms over the past week.  We discussed the impact his stressors have had on pt as well as how he copes with them. Pt reports on his health, family, va dr visits, and pain level, which remains chronic. [name] asked open ended questions. I went to kidney dr and they are concerned about one of my kidneys only working 20%.[facility] has decided I have to be circumcised so I am having that surgery in 2 weeks.. Pt went to court with daughter for custody and child support. The court case is continued the rest of the week. [name] asked open ended questions. [name] and pt discussed his upcoming surgery, fear of the known and recovery. [name] used CBT to assist pt working through his fears.        [name]     Collaboration of Care: Other: Continue to see providers at [facility]   Patient/Guardian was advised Release of Information must be obtained prior to any record release in order to collaborate their care with an outside provider. Patient/Guardian was advised if they have not alreay done so to contact the registration department to sign all necessary forms in order for us  to release information regarding their care.    Consent: Patient/Guardian gives verbal consent for treatment and assignment of benefits for services provided during this visit. Patient/Guardian expressed understanding and agreed to proceed.          Assessment and plan: Counselor will continue to meet with patient to address treatment plan goals. Patient will continue to follow recommendations of providers and implement skills learned in session, practicing skills between sessions. Diagnosis:  PTSD    Suicidal/Homicidal: Nowithout intent/plan   Therapist Response: Assessed pt's  current functioning and reviewed progress.. Assisted pt processing stressors, negative thoughts.  Assisted pt processing for the management of his stressors.   Participation Level: Active     Follow Up Instructions: I discussed the  assessment and treatment plan with the patient. The patient was provided an opportunity to ask questions and all were answered. The patient agreed with the plan and demonstrated an understanding of the instructions.   The patient was advised to call back or seek an in-person evaluation if the symptoms worsen or if the condition fails to improve as anticipated.   I provided 45 minutes of non-face-to-face time during this encounter.     Rozalia Dino S, LCAS  04/04/24

## 2024-04-11 ENCOUNTER — Ambulatory Visit (INDEPENDENT_AMBULATORY_CARE_PROVIDER_SITE_OTHER): Admitting: Licensed Clinical Social Worker

## 2024-04-11 DIAGNOSIS — F4310 Post-traumatic stress disorder, unspecified: Secondary | ICD-10-CM | POA: Diagnosis not present

## 2024-04-12 ENCOUNTER — Encounter (HOSPITAL_COMMUNITY): Payer: Self-pay | Admitting: Licensed Clinical Social Worker

## 2024-04-12 NOTE — Progress Notes (Unsigned)
 Virtual Visit via Video Note  I connected with [name] Jackson [name] on 04/11/24 at 2-2:45pm by a video-enabled telemedicine application and verified that I am speaking with the correct person using two identifiers.   I discussed the limitations of evaluation and management by telemedicine and the availability of in person appointments. The patient expressed understanding and agreed to proceed.   LOCATION: Patient: Home Provider: Home Office   History of Present Illness: Pt was referred to therapy by the [facility] for PTSD.    Participation Level: Active     Type of Therapy: Virtual Individual therapy   Treatment Goal addressed: Meet with clinician weekly for therapy to monitor for progress towards goals and address any barriers to success; Reduce depression from average severity level of 6/10 down to a 4/10 in next 6 mos by engaging in 1-2 positive coping skills for daily as part of developing self-care routine; Reduce average anxiety level from 7/10 down to 5/10 in next 6 mos by utilizing 3-5 relaxation skills/grounding skills per day, such as mindful breathing, progressive muscle relaxation, positive visualizations   Progression towards Goal: Progressing   Interventions: CBT/Supportive/Psychoeducation   Summary: Patient presented for today's session on time and was alert, oriented x5, with no evidence or self-report of SI/HI or A/V H. Patient reported ongoing compliance with medication.  Clinician inquired about patient's current emotional ratings, as well as any significant changes in thoughts, feelings or behavior since previous session. Patient reported his emotional ratings 5/10 for depression, 5/10 for anxiety, 2/10 for anger/irritability. [name] asked open ended questions. Pt reports on his maladaptive behaviors and thinking patterns over the past week, which interferes with resolution of his trauma symptoms 2/3 X within the past week. Pt reported 2 nightmare over the past week.[name]  and pt reviewed his trauma symptoms over the past week.  We discussed the impact his stressors have had on pt as well as how he copes with them. Pt reports on his health, family, va dr visits, and pain level, which remains chronic. [name] asked open ended questions. I went to kidney dr and they are concerned about one of my kidneys only working 20%.[facility] has decided I have to be circumcised so I am having that surgery in 2 weeks.. Pt went to court with daughter for custody and child support. The court case is continued the rest of the week. [name] asked open ended questions. [name] and pt discussed his upcoming surgery, fear of the known and recovery. [name] used CBT to assist pt working through his fears.        Jackson [name]     Collaboration of Care: Other: Continue to see providers at [facility]   Patient/Guardian was advised Release of Information must be obtained prior to any record release in order to collaborate their care with an outside provider. Patient/Guardian was advised if they have not alreay done so to contact the registration department to sign all necessary forms in order for us  to release information regarding their care.    Consent: Patient/Guardian gives verbal consent for treatment and assignment of benefits for services provided during this visit. Patient/Guardian expressed understanding and agreed to proceed.          Assessment and plan: Counselor will continue to meet with patient to address treatment plan goals. Patient will continue to follow recommendations of providers and implement skills learned in session, practicing skills between sessions. Diagnosis:  PTSD    Suicidal/Homicidal: Nowithout intent/plan   Therapist Response: Assessed pt's  current functioning and reviewed progress.. Assisted pt processing stressors, negative thoughts.  Assisted pt processing for the management of his stressors.   Participation Level: Active     Follow Up Instructions: I discussed the  assessment and treatment plan with the patient. The patient was provided an opportunity to ask questions and all were answered. The patient agreed with the plan and demonstrated an understanding of the instructions.   The patient was advised to call back or seek an in-person evaluation if the symptoms worsen or if the condition fails to improve as anticipated.   I provided 45 minutes of non-face-to-face time during this encounter.     Castle Lamons S, LCAS  04/04/24

## 2024-04-18 ENCOUNTER — Encounter (HOSPITAL_COMMUNITY): Payer: Self-pay | Admitting: Licensed Clinical Social Worker

## 2024-04-18 ENCOUNTER — Ambulatory Visit (INDEPENDENT_AMBULATORY_CARE_PROVIDER_SITE_OTHER): Admitting: Licensed Clinical Social Worker

## 2024-04-18 DIAGNOSIS — F4310 Post-traumatic stress disorder, unspecified: Secondary | ICD-10-CM

## 2024-04-18 NOTE — Progress Notes (Unsigned)
 Virtual Visit via Video Note  I connected with [name] [place] on 04/18/24 at 2-2:45pm by a video-enabled telemedicine application and verified that I am speaking with the correct person using two identifiers.   I discussed the limitations of evaluation and management by telemedicine and the availability of in person appointments. The patient expressed understanding and agreed to proceed.   LOCATION: Patient: Home Provider: Home Office   History of Present Illness: Pt was referred to therapy by the [facility] for PTSD.    Participation Level: Active     Type of Therapy: Virtual Individual therapy   Treatment Goal addressed: Meet with clinician weekly for therapy to monitor for progress towards goals and address any barriers to success; Reduce depression from average severity level of 6/10 down to a 4/10 in next 6 mos by engaging in 1-2 positive coping skills for daily as part of developing self-care routine; Reduce average anxiety level from 7/10 down to 5/10 in next 6 mos by utilizing 3-5 relaxation skills/grounding skills per day, such as mindful breathing, progressive muscle relaxation, positive visualizations   Progression towards Goal: Progressing   Interventions: CBT/Supportive/Psychoeducation   Summary: Patient presented for today's session on time and was alert, oriented x5, with no evidence or self-report of SI/HI or A/V H. Patient reported ongoing compliance with medication.  Clinician inquired about patient's current emotional ratings, as well as any significant changes in thoughts, feelings or behavior since previous session. Patient reported his emotional ratings 5/10 for depression, 5/10 for anxiety, 2/10 for anger/irritability. [name] asked open ended questions. Pt reports on his maladaptive behaviors and thinking patterns over the past week, which interferes with resolution of his trauma symptoms 2/3 X within the past week. Pt reported 2 nightmare over the past week.[name] and  pt reviewed his trauma symptoms over the past week.  We discussed the impact his stressors have had on pt as well as how he copes with them. Pt reports on his health, family, va dr visits, and pain level, which remains chronic. [name] asked open ended questions. I went to kidney dr and they are concerned about one of my kidneys only working 20%.I [facility] an app[facility] next week to see my kidney function and I have lots of kidney stones. I go back to the kidney dr the end of this week.SABRASABRAPt discussed his thanksgiving plans, but he ended up staying home. [name] asked open ended questions. [name] discussed his moods during the holidays. I was not depressed or anxious. Pt talked about seeing people over the holidays, especially other veterans.           [name]     Collaboration of Care: Other: Continue to see providers at [facility]   Patient/Guardian was advised Release of Information must be obtained prior to any record release in order to collaborate their care with an outside provider. Patient/Guardian was advised if they have not alreay done so to contact the registration department to sign all necessary forms in order for us  to release information regarding their care.    Consent: Patient/Guardian gives verbal consent for treatment and assignment of benefits for services provided during this visit. Patient/Guardian expressed understanding and agreed to proceed.          Assessment and plan: Counselor will continue to meet with patient to address treatment plan goals. Patient will continue to follow recommendations of providers and implement skills learned in session, practicing skills between sessions. Diagnosis:  PTSD    Suicidal/Homicidal:  Nowithout intent/plan   Therapist Response: Assessed pt's current functioning and reviewed progress.. Assisted pt processing stressors, negative thoughts.  Assisted pt processing for the management of his stressors.   Participation Level: Active      Follow Up Instructions: I discussed the assessment and treatment plan with the patient. The patient was provided an opportunity to ask questions and all were answered. The patient agreed with the plan and demonstrated an understanding of the instructions.   The patient was advised to call back or seek an in-person evaluation if the symptoms worsen or if the condition fails to improve as anticipated.   I provided 45 minutes of non-face-to-face time during this encounter.     Jerrye Seebeck S, LCAS  04/18/24

## 2024-04-25 ENCOUNTER — Ambulatory Visit (HOSPITAL_COMMUNITY): Admitting: Licensed Clinical Social Worker

## 2024-04-27 ENCOUNTER — Encounter (HOSPITAL_COMMUNITY): Payer: Self-pay | Admitting: Licensed Clinical Social Worker

## 2024-04-27 ENCOUNTER — Ambulatory Visit (INDEPENDENT_AMBULATORY_CARE_PROVIDER_SITE_OTHER): Admitting: Licensed Clinical Social Worker

## 2024-04-27 DIAGNOSIS — F4310 Post-traumatic stress disorder, unspecified: Secondary | ICD-10-CM | POA: Diagnosis not present

## 2024-04-27 NOTE — Progress Notes (Signed)
 Virtual Visit via Video Note  I connected with Jackson [name]. Grable on 04/27/24 at 3-:3:45pm by a video-enabled telemedicine application and verified that I am speaking with the correct person using two identifiers.   I discussed the limitations of evaluation and management by telemedicine and the availability of in person appointments. The patient expressed understanding and agreed to proceed.   LOCATION: Patient: Home Provider: Home Office   History of Present Illness: Pt was referred to therapy by the [facility] for PTSD.    Participation Level: Active     Type of Therapy: Virtual Individual therapy   Treatment Goal addressed: Meet with clinician weekly for therapy to monitor for progress towards goals and address any barriers to success; Reduce depression from average severity level of 6/10 down to a 4/10 in next 6 mos by engaging in 1-2 positive coping skills for daily as part of developing self-care routine; Reduce average anxiety level from 7/10 down to 5/10 in next 6 mos by utilizing 3-5 relaxation skills/grounding skills per day, such as mindful breathing, progressive muscle relaxation, positive visualizations   Progression towards Goal: Progressing   Interventions: CBT/Supportive/Psychoeducation   Summary: Patient presented for todays session on time and was alert, oriented x5, with no evidence or self-report of SI/HI or A/V H. Patient reported ongoing compliance with medication.  Clinician inquired about patients current emotional ratings, as well as any significant changes in thoughts, feelings or behavior since previous session. Patient reported his emotional ratings 5/10 for depression, 5/10 for anxiety, 2/10 for anger/irritability. [name] asked open ended questions. Pt reports on his maladaptive behaviors and thinking patterns over the past week, which interferes with resolution of his trauma symptoms 2/3 X within the past week. Pt reported 2 nightmare over the past week.[name]  and pt reviewed his trauma symptoms over the past week.  We discussed the impact his stressors have had on pt as well as how he copes with them. Pt reports on his health, family, va dr visits, and pain level, which remains chronic. [name] asked open ended questions. I went to kidney dr and they are concerned about one of my kidneys only working 20%.I had the foreskin of my penis cut off yesterday. [name] asked open ended questions about surgery and recovery. Pt'[name] mood was positive because II feel better because they got out all the bacteria out.. [name] provided psychoeducation on  how to move forward even after surgery and recovery, not falling in a depression.         Jackson [name]     Collaboration of Care: Other: Continue to see providers at [facility]   Patient/Guardian was advised Release of Information must be obtained prior to any record release in order to collaborate their care with an outside provider. Patient/Guardian was advised if they have not alreay done so to contact the registration department to sign all necessary forms in order for us  to release information regarding their care.    Consent: Patient/Guardian gives verbal consent for treatment and assignment of benefits for services provided during this visit. Patient/Guardian expressed understanding and agreed to proceed.          Assessment and plan: Counselor will continue to meet with patient to address treatment plan goals. Patient will continue to follow recommendations of providers and implement skills learned in session, practicing skills between sessions. Diagnosis:  PTSD    Suicidal/Homicidal: Nowithout intent/plan   Therapist Response: Assessed pt'[name] current functioning and reviewed progress.. Assisted pt processing stressors, negative thoughts.  Assisted  pt processing for the management of his stressors.   Participation Level: Active     Follow Up Instructions: I discussed the assessment and treatment plan with the patient. The  patient was provided an opportunity to ask questions and all were answered. The patient agreed with the plan and demonstrated an understanding of the instructions.   The patient was advised to call back or seek an in-person evaluation if the symptoms worsen or if the condition fails to improve as anticipated.   I provided 45 minutes of non-face-to-face time during this encounter.     Jackson [name], LCAS  04/27/24

## 2024-05-02 ENCOUNTER — Ambulatory Visit (INDEPENDENT_AMBULATORY_CARE_PROVIDER_SITE_OTHER): Admitting: Licensed Clinical Social Worker

## 2024-05-02 DIAGNOSIS — F4310 Post-traumatic stress disorder, unspecified: Secondary | ICD-10-CM | POA: Diagnosis not present

## 2024-05-04 ENCOUNTER — Encounter (HOSPITAL_COMMUNITY): Payer: Self-pay | Admitting: Licensed Clinical Social Worker

## 2024-05-04 NOTE — Progress Notes (Unsigned)
 Virtual Visit via Video Note  I connected with Jackson [name]. Hast on 04/1724 at 2-:2:45pm by a video-enabled telemedicine application and verified that I am speaking with the correct person using two identifiers.   I discussed the limitations of evaluation and management by telemedicine and the availability of in person appointments. The patient expressed understanding and agreed to proceed.   LOCATION: Patient: Home Provider: Home Office   History of Present Illness: Pt was referred to therapy by the Jackson [name] for PTSD.    Participation Level: Active     Type of Therapy: Virtual Individual therapy   Treatment Goal addressed: Meet with clinician weekly for therapy to monitor for progress towards goals and address any barriers to success; Reduce depression from average severity level of 6/10 down to a 4/10 in next 6 mos by engaging in 1-2 positive coping skills for daily as part of developing self-care routine; Reduce average anxiety level from 7/10 down to 5/10 in next 6 mos by utilizing 3-5 relaxation skills/grounding skills per day, such as mindful breathing, progressive muscle relaxation, positive visualizations   Progression towards Goal: Progressing   Interventions: CBT/Supportive/Psychoeducation   Summary: Patient presented for todays session on time and was alert, oriented x5, with no evidence or self-report of SI/HI or A/V H. Patient reported ongoing compliance with medication.  Clinician inquired about patients current emotional ratings, as well as any significant changes in thoughts, feelings or behavior since previous session. Patient reported his emotional ratings 5/10 for depression, 5/10 for anxiety, 2/10 for anger/irritability. [name] asked open ended questions. Pt reports on his maladaptive behaviors and thinking patterns over the past week, which interferes with resolution of his trauma symptoms 2/3 X within the past week. Pt reported 2 nightmare over the past week.[name]  and pt reviewed his trauma symptoms over the past week.  We discussed the impact his stressors have had on pt as well as how he copes with them. Pt reports on his health, family, va dr visits, and pain level, which remains chronic. [name] asked open ended questions. I went to kidney dr and they are concerned about one of my kidneys only working 20%.I had the foreskin of my penis cut off yesterday. [name] asked open ended questions about surgery and recovery. Pt's mood was positive because II feel better because they got out all the bacteria out.. [name] provided psychoeducation on  how to move forward even after surgery and recovery, not falling in a depression.         Jackson [name]     Collaboration of Care: Other: Continue to see providers at [facility]   Patient/Guardian was advised Release of Information must be obtained prior to any record release in order to collaborate their care with an outside provider. Patient/Guardian was advised if they have not alreay done so to contact the registration department to sign all necessary forms in order for us  to release information regarding their care.    Consent: Patient/Guardian gives verbal consent for treatment and assignment of benefits for services provided during this visit. Patient/Guardian expressed understanding and agreed to proceed.          Assessment and plan: Counselor will continue to meet with patient to address treatment plan goals. Patient will continue to follow recommendations of providers and implement skills learned in session, practicing skills between sessions. Diagnosis:  PTSD    Suicidal/Homicidal: Nowithout intent/plan   Therapist Response: Assessed pt's current functioning and reviewed progress.. Assisted pt processing stressors, negative thoughts.  Assisted  pt processing for the management of his stressors.   Participation Level: Active     Follow Up Instructions: I discussed the assessment and treatment plan with the patient. The  patient was provided an opportunity to ask questions and all were answered. The patient agreed with the plan and demonstrated an understanding of the instructions.   The patient was advised to call back or seek an in-person evaluation if the symptoms worsen or if the condition fails to improve as anticipated.   I provided 45 minutes of non-face-to-face time during this encounter.     Jackson [name] S, LCAS  05/02/24

## 2024-05-09 ENCOUNTER — Encounter (HOSPITAL_COMMUNITY): Payer: Self-pay | Admitting: Licensed Clinical Social Worker

## 2024-05-09 ENCOUNTER — Ambulatory Visit (HOSPITAL_COMMUNITY): Admitting: Licensed Clinical Social Worker

## 2024-05-09 DIAGNOSIS — F4310 Post-traumatic stress disorder, unspecified: Secondary | ICD-10-CM

## 2024-05-09 NOTE — Progress Notes (Signed)
 " Virtual Visit via Video Note  I connected with [name] [place] on 12/122/25 at 2-:2:45pm by a video-enabled telemedicine application and verified that I am speaking with the correct person using two identifiers.   I discussed the limitations of evaluation and management by telemedicine and the availability of in person appointments. The patient expressed understanding and agreed to proceed.   LOCATION: Patient: Home Provider: Home Office   History of Present Illness: Pt was referred to therapy by the [facility] for PTSD.    Participation Level: Active     Type of Therapy: Virtual Individual therapy   Treatment Goal addressed: Meet with clinician weekly for therapy to monitor for progress towards goals and address any barriers to success; Reduce depression from average severity level of 6/10 down to a 4/10 in next 6 mos by engaging in 1-2 positive coping skills for daily as part of developing self-care routine; Reduce average anxiety level from 7/10 down to 5/10 in next 6 mos by utilizing 3-5 relaxation skills/grounding skills per day, such as mindful breathing, progressive muscle relaxation, positive visualizations   Progression towards Goal: Progressing   Interventions: CBT/Supportive/Psychoeducation   Summary: Patient presented for todays session on time and was alert, oriented x5, with no evidence or self-report of SI/HI or A/V H. Patient reported ongoing compliance with medication.  Clinician inquired about patients current emotional ratings, as well as any significant changes in thoughts, feelings or behavior since previous session. Patient reported his emotional ratings 5/10 for depression, 5/10 for anxiety, 2/10 for anger/irritability. [name] asked open ended questions. Pt reports on his maladaptive behaviors and thinking patterns over the past week, which interferes with resolution of his trauma symptoms 2/3 X within the past week. Pt reported 3 nightmare over the past week.[name]  and pt reviewed his trauma symptoms over the past week.  We discussed the impact his stressors have had on pt as well as how he copes with them. Pt reports on his health, family, va dr visits, and pain level, which remains chronic. Im continuing to see dr who are helping with my [facility] case. I'm back to 40% disability. I'm doing ok financially but I still want 100% for more benefits.  [name] asked open ended questions. Pt discussed his family holiday plans, being around lots of people, which can exacerbate my ptsd symptoms. Again,cln and pt discussed coping skills. I go to the dr tomorrow for provider to examine my circumcision which I'm having problems with.           [name]     Collaboration of Care: Other: Continue to see providers at [facility]   Patient/Guardian was advised Release of Information must be obtained prior to any record release in order to collaborate their care with an outside provider. Patient/Guardian was advised if they have not alreay done so to contact the registration department to sign all necessary forms in order for us  to release information regarding their care.    Consent: Patient/Guardian gives verbal consent for treatment and assignment of benefits for services provided during this visit. Patient/Guardian expressed understanding and agreed to proceed.          Assessment and plan: Counselor will continue to meet with patient to address treatment plan goals. Patient will continue to follow recommendations of providers and implement skills learned in session, practicing skills between sessions. Diagnosis:  PTSD    Suicidal/Homicidal: Nowithout intent/plan   Therapist Response: Assessed pt's current functioning and reviewed progress.. Assisted pt processing stressors, negative  thoughts.  Assisted pt processing for the management of his stressors.   Participation Level: Active     Follow Up Instructions: I discussed the assessment and treatment plan with the patient. The  patient was provided an opportunity to ask questions and all were answered. The patient agreed with the plan and demonstrated an understanding of the instructions.   The patient was advised to call back or seek an in-person evaluation if the symptoms worsen or if the condition fails to improve as anticipated.   I provided 45 minutes of non-face-to-face time during this encounter.     Avery Eustice S, LCAS  05/09/24         "

## 2024-05-16 ENCOUNTER — Encounter (HOSPITAL_COMMUNITY): Payer: Self-pay | Admitting: Licensed Clinical Social Worker

## 2024-05-16 ENCOUNTER — Ambulatory Visit (HOSPITAL_COMMUNITY): Admitting: Licensed Clinical Social Worker

## 2024-05-16 DIAGNOSIS — F4310 Post-traumatic stress disorder, unspecified: Secondary | ICD-10-CM | POA: Diagnosis not present

## 2024-05-16 NOTE — Progress Notes (Unsigned)
 " Virtual Visit via Video Note  I connected with [name] Jackson [name] on 05/16/24 at 2-:2:45pm by a video-enabled telemedicine application and verified that I am speaking with the correct person using two identifiers.   I discussed the limitations of evaluation and management by telemedicine and the availability of in person appointments. The patient expressed understanding and agreed to proceed.   LOCATION: Patient: Home Provider: Home Office   History of Present Illness: Pt was referred to therapy by the [facility] for PTSD.    Participation Level: Active     Type of Therapy: Virtual Individual therapy   Treatment Goal addressed: Meet with clinician weekly for therapy to monitor for progress towards goals and address any barriers to success; Reduce depression from average severity level of 6/10 down to a 4/10 in next 6 mos by engaging in 1-2 positive coping skills for daily as part of developing self-care routine; Reduce average anxiety level from 7/10 down to 5/10 in next 6 mos by utilizing 3-5 relaxation skills/grounding skills per day, such as mindful breathing, progressive muscle relaxation, positive visualizations   Progression towards Goal: Progressing   Interventions: CBT/Supportive/Psychoeducation   Summary: Patient presented for todays session on time and was alert, oriented x5, with no evidence or self-report of SI/HI or A/V H. Patient reported ongoing compliance with medication.  Clinician inquired about patients current emotional ratings, as well as any significant changes in thoughts, feelings or behavior since previous session. Patient reported his emotional ratings 5/10 for depression, 5/10 for anxiety, 410 for anger/irritability. [name] asked open ended questions. Pt reports on his maladaptive behaviors and thinking patterns over the past week, which interferes with resolution of his trauma symptoms 2/3 X within the past week. Pt reported 3 nightmare over the past week.[name]  and pt reviewed his trauma symptoms over the past week.  We discussed the impact his stressors have had on pt as well as how he copes with them. Pt reports on his holidays as well as his continued pain. I went to the [facility] for a followup on his circumcisen, which is not healing properly and causing pain., and has developed an infection. [name] asked open ended questions. My holidays were ok but I was in a lot of pain. I spent time with family. I'm angry with how the [facility] providers treat me and don't provide a good service.  [name] suggested pt contact the  patient advocate at the [facility] for support while seeking services at the [facility].            Jackson [name]     Collaboration of Care: Other: Continue to see providers at [facility]   Patient/Guardian was advised Release of Information must be obtained prior to any record release in order to collaborate their care with an outside provider. Patient/Guardian was advised if they have not alreay done so to contact the registration department to sign all necessary forms in order for us  to release information regarding their care.    Consent: Patient/Guardian gives verbal consent for treatment and assignment of benefits for services provided during this visit. Patient/Guardian expressed understanding and agreed to proceed.          Assessment and plan: Counselor will continue to meet with patient to address treatment plan goals. Patient will continue to follow recommendations of providers and implement skills learned in session, practicing skills between sessions. Diagnosis:  PTSD    Suicidal/Homicidal: Nowithout intent/plan   Therapist Response: Assessed pt'[name] current functioning and reviewed progress.. Assisted pt  processing stressors, negative thoughts.  Assisted pt processing for the management of his stressors.   Participation Level: Active     Follow Up Instructions: I discussed the assessment and treatment plan with the patient. The patient was provided an  opportunity to ask questions and all were answered. The patient agreed with the plan and demonstrated an understanding of the instructions.   The patient was advised to call back or seek an in-person evaluation if the symptoms worsen or if the condition fails to improve as anticipated.   I provided 45 minutes of non-face-to-face time during this encounter.     Jackson [name], LCAS  05/16/24         "

## 2024-05-23 ENCOUNTER — Ambulatory Visit (INDEPENDENT_AMBULATORY_CARE_PROVIDER_SITE_OTHER): Admitting: Licensed Clinical Social Worker

## 2024-05-23 ENCOUNTER — Encounter (HOSPITAL_COMMUNITY): Payer: Self-pay | Admitting: Licensed Clinical Social Worker

## 2024-05-23 DIAGNOSIS — F4310 Post-traumatic stress disorder, unspecified: Secondary | ICD-10-CM | POA: Diagnosis not present

## 2024-05-23 NOTE — Progress Notes (Signed)
 " Virtual Visit via Video Note  I connected with Jackson [name]. Jackson [name] on 05/23/24 at 2-:2:45pm by a video-enabled telemedicine application and verified that I am speaking with the correct person using two identifiers.   I discussed the limitations of evaluation and management by telemedicine and the availability of in person appointments. The patient expressed understanding and agreed to proceed.   LOCATION: Patient: Home Provider: Home Office   History of Present Illness: Pt was referred to therapy by the [facility] for PTSD.    Participation Level: Active     Type of Therapy: Virtual Individual therapy   Treatment Goal addressed: Meet with clinician weekly for therapy to monitor for progress towards goals and address any barriers to success; Reduce depression from average severity level of 6/10 down to a 4/10 in next 6 mos by engaging in 1-2 positive coping skills for daily as part of developing self-care routine; Reduce average anxiety level from 7/10 down to 5/10 in next 6 mos by utilizing 3-5 relaxation skills/grounding skills per day, such as mindful breathing, progressive muscle relaxation, positive visualizations   Progression towards Goal: Progressing   Interventions: CBT/Supportive/Psychoeducation   Summary: Patient presented for todays session on time and was alert, oriented x5, with no evidence or self-report of SI/HI or A/V H. Patient reported ongoing compliance with medication.  Clinician inquired about patients current emotional ratings, as well as any significant changes in thoughts, feelings or behavior since previous session. Patient reported his emotional ratings 5/10 for depression, 5/10 for anxiety, 410 for anger/irritability. [name] asked open ended questions. Pt reports on his maladaptive behaviors and thinking patterns over the past week, which interferes with resolution of his trauma symptoms 2/3 X within the past week. Pt reported 3 nightmare over the past week.[name] and  pt reviewed his trauma symptoms over the past week.  We discussed the impact his stressors have had on pt as well as how he copes with them. Pt reports on his holidays as well as his continued pain. I received my antibiotics and they are helping somewhat. [name] asked open ended questions and followup of infection. Pt discussed his new year holiday, goals for new year, daughter's health and his own health.            [name]     Collaboration of Care: Other: Continue to see providers at [facility]   Patient/Guardian was advised Release of Information must be obtained prior to any record release in order to collaborate their care with an outside provider. Patient/Guardian was advised if they have not alreay done so to contact the registration department to sign all necessary forms in order for us  to release information regarding their care.    Consent: Patient/Guardian gives verbal consent for treatment and assignment of benefits for services provided during this visit. Patient/Guardian expressed understanding and agreed to proceed.          Assessment and plan: Counselor will continue to meet with patient to address treatment plan goals. Patient will continue to follow recommendations of providers and implement skills learned in session, practicing skills between sessions. Diagnosis:  PTSD    Suicidal/Homicidal: Nowithout intent/plan   Therapist Response: Assessed pt's current functioning and reviewed progress.. Assisted pt processing stressors, negative thoughts.  Assisted pt processing for the management of his stressors.   Participation Level: Active     Follow Up Instructions: I discussed the assessment and treatment plan with the patient. The patient was provided an opportunity to ask questions and all  were answered. The patient agreed with the plan and demonstrated an understanding of the instructions.   The patient was advised to call back or seek an in-person evaluation if the symptoms  worsen or if the condition fails to improve as anticipated.   I provided 45 minutes of non-face-to-face time during this encounter.     [name], LCAS  05/23/24         "

## 2024-05-26 ENCOUNTER — Telehealth (HOSPITAL_COMMUNITY): Payer: Self-pay

## 2024-05-26 NOTE — Telephone Encounter (Signed)
 Coordinator sent outgoing fax to [facility]. Fax contained authorization extension request for outpt psychotherapy. Will alert provider once decision is received.

## 2024-05-30 ENCOUNTER — Ambulatory Visit (HOSPITAL_COMMUNITY): Admitting: Licensed Clinical Social Worker

## 2024-05-31 ENCOUNTER — Ambulatory Visit (INDEPENDENT_AMBULATORY_CARE_PROVIDER_SITE_OTHER): Admitting: Licensed Clinical Social Worker

## 2024-05-31 DIAGNOSIS — F4310 Post-traumatic stress disorder, unspecified: Secondary | ICD-10-CM

## 2024-06-02 ENCOUNTER — Telehealth (HOSPITAL_COMMUNITY): Payer: Self-pay

## 2024-06-02 NOTE — Telephone Encounter (Signed)
 Received incoming fax from [facility] pertaining to pt. [facility] requested a refax of RFAS with last OV visit attached.   Outgoing fax sent on 06/02/24, with attachment of last completed OV visit from 05/23/2024. Will ensure successful transmission.

## 2024-06-06 ENCOUNTER — Ambulatory Visit (HOSPITAL_COMMUNITY): Admitting: Licensed Clinical Social Worker

## 2024-06-06 ENCOUNTER — Encounter (HOSPITAL_COMMUNITY): Payer: Self-pay

## 2024-06-07 ENCOUNTER — Ambulatory Visit (HOSPITAL_COMMUNITY): Admitting: Licensed Clinical Social Worker

## 2024-06-07 DIAGNOSIS — F4310 Post-traumatic stress disorder, unspecified: Secondary | ICD-10-CM

## 2024-06-12 ENCOUNTER — Encounter (HOSPITAL_COMMUNITY): Payer: Self-pay | Admitting: Licensed Clinical Social Worker

## 2024-06-12 NOTE — Progress Notes (Signed)
 " Virtual Visit via Video Note  I connected with [name] [place] on 05/31/24 at 1-:1:45pm by a video-enabled telemedicine application and verified that I am speaking with the correct person using two identifiers.   I discussed the limitations of evaluation and management by telemedicine and the availability of in person appointments. The patient expressed understanding and agreed to proceed.   LOCATION: Patient: Home Provider: Home Office   History of Present Illness: Pt was referred to therapy by the [facility] for PTSD.    Participation Level: Active     Type of Therapy: Virtual Individual therapy   Treatment Goal addressed: Meet with clinician weekly for therapy to monitor for progress towards goals and address any barriers to success; Reduce depression from average severity level of 6/10 down to a 4/10 in next 6 mos by engaging in 1-2 positive coping skills for daily as part of developing self-care routine; Reduce average anxiety level from 7/10 down to 5/10 in next 6 mos by utilizing 3-5 relaxation skills/grounding skills per day, such as mindful breathing, progressive muscle relaxation, positive visualizations   Progression towards Goal: Progressing   Interventions: CBT/Supportive/Psychoeducation   Summary: Patient presented for todays session on time and was alert, oriented x5, with no evidence or self-report of SI/HI or A/V H. Patient reported ongoing compliance with medication.  Clinician inquired about patients current emotional ratings, as well as any significant changes in thoughts, feelings or behavior since previous session. Patient reported his emotional ratings 5/10 for depression, 5/10 for anxiety, 410 for anger/irritability. [name] asked open ended questions. Pt reports on his maladaptive behaviors and thinking patterns over the past week, which interferes with resolution of his trauma symptoms 2/3 X within the past week. Pt reported 3 nightmare over the past week, taking  Minipress nightly for the nightmares. .[name] and pt reviewed his trauma symptoms over the past week.  We discussed the impact his stressors have had on pt as well as how he copes with them. I can tell my depression has increased due to my daughter's cancer dx, prognosis. [name] asked open ended questions and used Clinician utilized CBT to process concerns about his daughter, worries about his granddaughters.       Collaboration of Care: Other: Continue to see providers at [facility]   Patient/Guardian was advised Release of Information must be obtained prior to any record release in order to collaborate their care with an outside provider. Patient/Guardian was advised if they have not alreay done so to contact the registration department to sign all necessary forms in order for us  to release information regarding their care.    Consent: Patient/Guardian gives verbal consent for treatment and assignment of benefits for services provided during this visit. Patient/Guardian expressed understanding and agreed to proceed.          Assessment and plan: Counselor will continue to meet with patient to address treatment plan goals. Patient will continue to follow recommendations of providers and implement skills learned in session, practicing skills between sessions. Diagnosis:  PTSD    Suicidal/Homicidal: Nowithout intent/plan   Therapist Response: Assessed pt's current functioning and reviewed progress.. Assisted pt processing stressors, negative thoughts.  Assisted pt processing for the management of his stressors.   Participation Level: Active     Follow Up Instructions: I discussed the assessment and treatment plan with the patient. The patient was provided an opportunity to ask questions and all were answered. The patient agreed with the plan and demonstrated an understanding of the  instructions.   The patient was advised to call back or seek an in-person evaluation if the symptoms worsen or if the  condition fails to improve as anticipated.   I provided 45 minutes of non-face-to-face time during this encounter.     Redding Cloe S, LCAS  05/31/24         "

## 2024-06-13 ENCOUNTER — Ambulatory Visit (HOSPITAL_COMMUNITY): Admitting: Licensed Clinical Social Worker

## 2024-06-15 ENCOUNTER — Encounter (HOSPITAL_COMMUNITY): Payer: Self-pay | Admitting: Licensed Clinical Social Worker

## 2024-06-15 ENCOUNTER — Ambulatory Visit (HOSPITAL_COMMUNITY): Admitting: Licensed Clinical Social Worker

## 2024-06-15 ENCOUNTER — Telehealth (HOSPITAL_COMMUNITY): Payer: Self-pay

## 2024-06-15 DIAGNOSIS — F4310 Post-traumatic stress disorder, unspecified: Secondary | ICD-10-CM

## 2024-06-15 NOTE — Progress Notes (Signed)
 " Virtual Visit via Video Note  I connected with [name] [place] on 06/07/24 at 12-:12:45pm by a video-enabled telemedicine application and verified that I am speaking with the correct person using two identifiers.   I discussed the limitations of evaluation and management by telemedicine and the availability of in person appointments. The patient expressed understanding and agreed to proceed.   LOCATION: Patient: Home Provider: Home Office   History of Present Illness: Pt was referred to therapy by the [facility] for PTSD.    Participation Level: Active     Type of Therapy: Virtual Individual therapy   Treatment Goal addressed: Meet with clinician weekly for therapy to monitor for progress towards goals and address any barriers to success; Reduce depression from average severity level of 6/10 down to a 4/10 in next 6 mos by engaging in 1-2 positive coping skills for daily as part of developing self-care routine; Reduce average anxiety level from 7/10 down to 5/10 in next 6 mos by utilizing 3-5 relaxation skills/grounding skills per day, such as mindful breathing, progressive muscle relaxation, positive visualizations   Progression towards Goal: Progressing   Interventions: CBT/Supportive/Psychoeducation   Summary: Patient presented for todays session on time and was alert, oriented x5, with no evidence or self-report of SI/HI or A/V H. Patient reported ongoing compliance with medication.  Clinician inquired about patients current emotional ratings, as well as any significant changes in thoughts, feelings or behavior since previous session. Patient reported his emotional ratings 5/10 for depression, 5/10 for anxiety, 410 for anger/irritability. [name] asked open ended questions. Pt reports on his maladaptive behaviors and thinking patterns over the past week, which interferes with resolution of his trauma symptoms 2/3 X within the past week. Pt reported 3 nightmare over the past week,  taking Minipress nightly for the nightmares. .[name] and pt reviewed his trauma symptoms over the past week.  We discussed the impact his stressors have had on pt as well as how he copes with them. I am experiencing agitation, frustration, anger more often. [name] provided psychoeducation on low frustration tolerance, a symptoms of his ptsd.         Collaboration of Care: Other: Continue to see providers at [facility]   Patient/Guardian was advised Release of Information must be obtained prior to any record release in order to collaborate their care with an outside provider. Patient/Guardian was advised if they have not alreay done so to contact the registration department to sign all necessary forms in order for us  to release information regarding their care.    Consent: Patient/Guardian gives verbal consent for treatment and assignment of benefits for services provided during this visit. Patient/Guardian expressed understanding and agreed to proceed.          Assessment and plan: Counselor will continue to meet with patient to address treatment plan goals. Patient will continue to follow recommendations of providers and implement skills learned in session, practicing skills between sessions. Diagnosis:  PTSD    Suicidal/Homicidal: Nowithout intent/plan   Therapist Response: Assessed pt's current functioning and reviewed progress.. Assisted pt processing stressors, negative thoughts.  Assisted pt processing for the management of his stressors.   Participation Level: Active     Follow Up Instructions: I discussed the assessment and treatment plan with the patient. The patient was provided an opportunity to ask questions and all were answered. The patient agreed with the plan and demonstrated an understanding of the instructions.   The patient was advised to call back or seek  an in-person evaluation if the symptoms worsen or if the condition fails to improve as anticipated.   I provided 45 minutes  of non-face-to-face time during this encounter.     Talor Cheema S, LCAS  06/07/24         "

## 2024-06-15 NOTE — Telephone Encounter (Signed)
 Secure email sent to pt's case manager at [facility] requesting update on [facility] authorization extension request. Will await response before calling main dept line.

## 2024-06-16 ENCOUNTER — Telehealth (HOSPITAL_COMMUNITY): Payer: Self-pay

## 2024-06-16 NOTE — Telephone Encounter (Signed)
 Incoming email from pt's case manager who requested last 4 of ssn to verify pt and provide determination of auth extension request. Coordinator responded with requested info and will await determination.

## 2024-06-17 ENCOUNTER — Encounter (HOSPITAL_COMMUNITY): Payer: Self-pay | Admitting: Licensed Clinical Social Worker

## 2024-06-17 NOTE — Progress Notes (Signed)
 " Virtual Visit via Video Note  I connected with [name] [place] on 06/16/24 at 4-5pm by a video-enabled telemedicine application and verified that I am speaking with the correct person using two identifiers.   I discussed the limitations of evaluation and management by telemedicine and the availability of in person appointments. The patient expressed understanding and agreed to proceed.   LOCATION: Patient: Home Provider: Home Office   History of Present Illness: Pt was referred to therapy by the [facility] for PTSD.    Participation Level: Active     Type of Therapy: Virtual Individual therapy   Treatment Goal addressed: Meet with clinician weekly for therapy to monitor for progress towards goals and address any barriers to success; Reduce depression from average severity level of 6/10 down to a 4/10 in next 6 mos by engaging in 1-2 positive coping skills for daily as part of developing self-care routine; Reduce average anxiety level from 7/10 down to 5/10 in next 6 mos by utilizing 3-5 relaxation skills/grounding skills per day, such as mindful breathing, progressive muscle relaxation, positive visualizations   Progression towards Goal: Progressing   Interventions: CBT/Supportive/Psychoeducation   Summary: Patient presented for todays session on time and was alert, oriented x5, with no evidence or self-report of SI/HI or A/V H. Patient reported ongoing compliance with medication.  Clinician inquired about patients current emotional ratings, as well as any significant changes in thoughts, feelings or behavior since previous session. Patient reported his emotional ratings 5/10 for depression, 5/10 for anxiety, 410 for anger/irritability. [name] asked open ended questions. Pt reports on his maladaptive behaviors and thinking patterns over the past week, which interferes with resolution of his trauma symptoms 2/3 X within the past week. Pt reported 2 nightmare over the past week, taking  Minipress nightly for the nightmares. .[name] and pt reviewed his trauma symptoms over the past week.  We discussed the impact his stressors have had on pt as well as how he copes with them. I continue experiencing agitation, frustration, anger more often. Again, cln provided psychoeducation on low frustration tolerance, a symptoms of his ptsd.          Collaboration of Care: Other: Continue to see providers at [facility]   Patient/Guardian was advised Release of Information must be obtained prior to any record release in order to collaborate their care with an outside provider. Patient/Guardian was advised if they have not alreay done so to contact the registration department to sign all necessary forms in order for us  to release information regarding their care.    Consent: Patient/Guardian gives verbal consent for treatment and assignment of benefits for services provided during this visit. Patient/Guardian expressed understanding and agreed to proceed.          Assessment and plan: Counselor will continue to meet with patient to address treatment plan goals. Patient will continue to follow recommendations of providers and implement skills learned in session, practicing skills between sessions. Diagnosis:  PTSD    Suicidal/Homicidal: Nowithout intent/plan   Therapist Response: Assessed pt's current functioning and reviewed progress.. Assisted pt processing stressors, negative thoughts.  Assisted pt processing for the management of his stressors.   Participation Level: Active     Follow Up Instructions: I discussed the assessment and treatment plan with the patient. The patient was provided an opportunity to ask questions and all were answered. The patient agreed with the plan and demonstrated an understanding of the instructions.   The patient was advised to call back  or seek an in-person evaluation if the symptoms worsen or if the condition fails to improve as anticipated.   I provided 45  minutes of non-face-to-face time during this encounter.     Nohelani Benning S, LCAS  06/17/24         "

## 2024-06-20 ENCOUNTER — Encounter (HOSPITAL_COMMUNITY): Payer: Self-pay | Admitting: Licensed Clinical Social Worker

## 2024-06-20 ENCOUNTER — Ambulatory Visit (INDEPENDENT_AMBULATORY_CARE_PROVIDER_SITE_OTHER): Admitting: Licensed Clinical Social Worker

## 2024-06-20 DIAGNOSIS — F4310 Post-traumatic stress disorder, unspecified: Secondary | ICD-10-CM | POA: Diagnosis not present

## 2024-06-20 NOTE — Progress Notes (Signed)
 " Virtual Visit via Video Note  I connected with [name] [place] on 06/20/24 at 2-3pm by a video-enabled telemedicine application and verified that I am speaking with the correct person using two identifiers.   I discussed the limitations of evaluation and management by telemedicine and the availability of in person appointments. The patient expressed understanding and agreed to proceed.   LOCATION: Patient: Home Provider: Home Office   History of Present Illness: Pt was referred to therapy by the [facility] for PTSD.    Participation Level: Active     Type of Therapy: Virtual Individual therapy   Treatment Goal addressed: Meet with clinician weekly for therapy to monitor for progress towards goals and address any barriers to success; Reduce depression from average severity level of 6/10 down to a 4/10 in next 6 mos by engaging in 1-2 positive coping skills for daily as part of developing self-care routine; Reduce average anxiety level from 7/10 down to 5/10 in next 6 mos by utilizing 3-5 relaxation skills/grounding skills per day, such as mindful breathing, progressive muscle relaxation, positive visualizations   Progression towards Goal: Progressing   Interventions: CBT/Supportive/Psychoeducation   Summary: Patient presented for todays session on time and was alert, oriented x5, with no evidence or self-report of SI/HI or A/V H. Patient reported ongoing compliance with medication.  Clinician inquired about patients current emotional ratings, as well as any significant changes in thoughts, feelings or behavior since previous session. Patient reported his emotional ratings 5/10 for depression, 5/10 for anxiety, 410 for anger/irritability. [name] asked open ended questions. Pt reports on his maladaptive behaviors and thinking patterns over the past week, which interferes with resolution of his trauma symptoms 2/3 X within the past week. Pt reported 2 nightmare over the past week, taking  Minipress nightly for the nightmares. .[name] and pt reviewed his trauma symptoms over the past week.  We discussed the impact his stressors have had on pt as well as how he copes with them. 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 [name] provided psychoeducation on guilt affecting mental health.           Collaboration of Care: Other: Continue to see providers at [facility]   Patient/Guardian was advised Release of Information must be obtained prior to any record release in order to collaborate their care with an outside provider. Patient/Guardian was advised if they have not alreay done so to contact the registration department to sign all necessary forms in order for us  to release information regarding their care.    Consent: Patient/Guardian gives verbal consent for treatment and assignment of benefits for services provided during this visit. Patient/Guardian expressed understanding and agreed to proceed.          Assessment and plan: Counselor will continue to meet with patient to address treatment plan goals. Patient will continue to follow recommendations of providers and implement skills learned in session, practicing skills between sessions. Diagnosis:  PTSD    Suicidal/Homicidal: Nowithout intent/plan   Therapist Response: Assessed pt's current functioning and reviewed progress.. Assisted pt processing stressors, negative thoughts.  Assisted pt processing for the management of his stressors.   Participation Level: Active     Follow Up Instructions: I discussed the assessment and treatment plan with the patient. The patient was provided an opportunity to ask questions and all  were answered. The patient agreed with the plan and demonstrated an understanding of the instructions.   The patient was advised to call back or seek an in-person evaluation if the symptoms  worsen or if the condition fails to improve as anticipated.   I provided 45 minutes of non-face-to-face time during this encounter.     Mykell Rawl S, LCAS 06/20/24         "

## 2024-06-21 ENCOUNTER — Telehealth (HOSPITAL_COMMUNITY): Payer: Self-pay

## 2024-06-27 ENCOUNTER — Ambulatory Visit (HOSPITAL_COMMUNITY): Admitting: Licensed Clinical Social Worker

## 2024-07-04 ENCOUNTER — Ambulatory Visit (HOSPITAL_COMMUNITY): Admitting: Licensed Clinical Social Worker

## 2024-07-11 ENCOUNTER — Ambulatory Visit (HOSPITAL_COMMUNITY): Admitting: Licensed Clinical Social Worker
